# Patient Record
Sex: Female | Born: 1970 | Race: White | Hispanic: No | Marital: Married | Smoking: Never smoker
Health system: Southern US, Community
[De-identification: ages and names within clinical notes are randomized; demographics above are authoritative.]

## PROBLEM LIST (undated history)

## (undated) DIAGNOSIS — F419 Anxiety disorder, unspecified: Secondary | ICD-10-CM

## (undated) DIAGNOSIS — K56609 Unspecified intestinal obstruction, unspecified as to partial versus complete obstruction: Secondary | ICD-10-CM

## (undated) DIAGNOSIS — F32A Depression, unspecified: Secondary | ICD-10-CM

## (undated) DIAGNOSIS — I10 Essential (primary) hypertension: Secondary | ICD-10-CM

## (undated) HISTORY — DX: Depression, unspecified: F32.A

## (undated) HISTORY — PX: ABDOMINAL HYSTERECTOMY: SHX81

## (undated) HISTORY — PX: CHOLECYSTECTOMY: SHX55

## (undated) HISTORY — PX: GASTRIC BYPASS: SHX52

## (undated) HISTORY — PX: CARDIAC CATHETERIZATION: SHX172

## (undated) HISTORY — DX: Anxiety disorder, unspecified: F41.9

---

## 2020-09-22 DIAGNOSIS — N1330 Unspecified hydronephrosis: Secondary | ICD-10-CM | POA: Diagnosis not present

## 2020-09-22 DIAGNOSIS — R109 Unspecified abdominal pain: Secondary | ICD-10-CM | POA: Diagnosis not present

## 2020-09-22 DIAGNOSIS — N2889 Other specified disorders of kidney and ureter: Secondary | ICD-10-CM | POA: Diagnosis not present

## 2020-09-22 DIAGNOSIS — K449 Diaphragmatic hernia without obstruction or gangrene: Secondary | ICD-10-CM | POA: Diagnosis not present

## 2020-11-10 ENCOUNTER — Emergency Department
Admission: EM | Admit: 2020-11-10 | Discharge: 2020-11-10 | Disposition: A | Discharge status: Home / Self Care | Payer: BC Managed Care – PPO | Attending: Emergency Medicine | Admitting: Emergency Medicine

## 2020-11-10 ENCOUNTER — Other Ambulatory Visit: Payer: Self-pay

## 2020-11-10 DIAGNOSIS — M791 Myalgia, unspecified site: Secondary | ICD-10-CM | POA: Diagnosis not present

## 2020-11-10 DIAGNOSIS — B349 Viral infection, unspecified: Secondary | ICD-10-CM | POA: Insufficient documentation

## 2020-11-10 DIAGNOSIS — Z20822 Contact with and (suspected) exposure to covid-19: Secondary | ICD-10-CM | POA: Insufficient documentation

## 2020-11-10 DIAGNOSIS — E860 Dehydration: Secondary | ICD-10-CM | POA: Diagnosis not present

## 2020-11-10 DIAGNOSIS — R599 Enlarged lymph nodes, unspecified: Secondary | ICD-10-CM | POA: Insufficient documentation

## 2020-11-10 LAB — URINALYSIS, COMPLETE (UACMP) WITH MICROSCOPIC
Bacteria, UA: NONE SEEN
Bilirubin Urine: NEGATIVE
Glucose, UA: NEGATIVE mg/dL
Hgb urine dipstick: NEGATIVE
Ketones, ur: 5 mg/dL — AB
Leukocytes,Ua: NEGATIVE
Nitrite: NEGATIVE
Protein, ur: NEGATIVE mg/dL
Specific Gravity, Urine: 1.025 (ref 1.005–1.030)
WBC, UA: NONE SEEN WBC/hpf (ref 0–5)
pH: 5 (ref 5.0–8.0)

## 2020-11-10 LAB — COMPREHENSIVE METABOLIC PANEL
ALT: 25 U/L (ref 0–44)
AST: 24 U/L (ref 15–41)
Albumin: 4.1 g/dL (ref 3.5–5.0)
Alkaline Phosphatase: 89 U/L (ref 38–126)
Anion gap: 5 (ref 5–15)
BUN: 18 mg/dL (ref 6–20)
CO2: 29 mmol/L (ref 22–32)
Calcium: 8.8 mg/dL — ABNORMAL LOW (ref 8.9–10.3)
Chloride: 106 mmol/L (ref 98–111)
Creatinine, Ser: 0.63 mg/dL (ref 0.44–1.00)
GFR, Estimated: >60 mL/min (ref >60)
Glucose, Bld: 85 mg/dL (ref 70–99)
Potassium: 3.6 mmol/L (ref 3.5–5.1)
Sodium: 140 mmol/L (ref 135–145)
Total Bilirubin: 0.9 mg/dL (ref 0.3–1.2)
Total Protein: 6.5 g/dL (ref 6.5–8.1)

## 2020-11-10 LAB — CBC WITH DIFFERENTIAL/PLATELET
Abs Immature Granulocytes: 0.01 10*3/uL (ref 0.00–0.07)
Basophils Absolute: 0 10*3/uL (ref 0.0–0.1)
Basophils Relative: 0 %
Eosinophils Absolute: 0.2 10*3/uL (ref 0.0–0.5)
Eosinophils Relative: 3 %
HCT: 39.4 % (ref 36.0–46.0)
Hemoglobin: 13.3 g/dL (ref 12.0–15.0)
Immature Granulocytes: 0 %
Lymphocytes Relative: 29 %
Lymphs Abs: 1.9 10*3/uL (ref 0.7–4.0)
MCH: 29.2 pg (ref 26.0–34.0)
MCHC: 33.8 g/dL (ref 30.0–36.0)
MCV: 86.4 fL (ref 80.0–100.0)
Monocytes Absolute: 0.3 10*3/uL (ref 0.1–1.0)
Monocytes Relative: 5 %
Neutro Abs: 4 10*3/uL (ref 1.7–7.7)
Neutrophils Relative %: 63 %
Platelets: 285 10*3/uL (ref 150–400)
RBC: 4.56 MIL/uL (ref 3.87–5.11)
RDW: 13.1 % (ref 11.5–15.5)
WBC: 6.4 10*3/uL (ref 4.0–10.5)
nRBC: 0 % (ref 0.0–0.2)

## 2020-11-10 LAB — RESP PANEL BY RT-PCR (FLU A&B, COVID) ARPGX2
Influenza A by PCR: NEGATIVE
Influenza B by PCR: NEGATIVE
SARS Coronavirus 2 by RT PCR: NEGATIVE

## 2020-11-10 LAB — TROPONIN I (HIGH SENSITIVITY): Troponin I (High Sensitivity): 4 ng/L (ref <18)

## 2020-11-10 MED ORDER — SODIUM CHLORIDE 0.9 % IV BOLUS
1000.0000 mL | Freq: Once | INTRAVENOUS | Status: AC
  Administered 2020-11-10: 1000 mL via INTRAVENOUS

## 2020-11-10 NOTE — Discharge Instructions (Signed)
Please make sure you are eating and drinking well.  Return to the ER for any fevers, weakness, fatigue, dizziness, urgent changes in her health.

## 2020-11-10 NOTE — ED Provider Notes (Signed)
Patient's EKG is reviewed inter by me at 1940 Heart rate 55 9 QRS 89 QTc 440 Sinus bradycardia, no evidence acute ischemia   Sharyn Creamer, MD 11/10/20 1945

## 2020-11-10 NOTE — ED Provider Notes (Signed)
[facility] EMERGENCY DEPARTMENT Provider Note   CSN: [id number] Arrival date & time: 11/10/20  1744     History Chief Complaint  Patient presents with   Generalized Body Aches    [name] is a 50 y.o. female presents to the emergency department for evaluation of feeling tired and dizzy with occasional body aches.  Symptoms been present for 6 days.  She has had a little bit of a sore throat without a cough with no chest pain or shortness of breath.  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  She denies any chest pain, shortness of breath, abdominal pain, nausea vomiting or diarrhea.  No rashes.  She has been exposed to COVID and has been working at home due to this exposure.  She has been fully vaccinated for COVID.  She denies any fevers and has not been taking Tylenol or ibuprofen.  Patient has maintained a good appetite.  HPI     No past medical history on file.  There are no problems to display for this patient.    OB History   No obstetric history on file.     No family history on file.     Home Medications Prior to Admission medications   Not on File    Allergies    Patient has no allergy information on record.  Review of Systems   Review of Systems  Constitutional:  Positive for fatigue. Negative for chills and fever.  HENT:  Positive for sinus pain and sore throat. Negative for trouble swallowing and voice change.   Respiratory:  Negative for shortness of breath.   Cardiovascular:  Negative for chest pain.  Gastrointestinal:  Negative for abdominal pain, diarrhea, nausea and vomiting.  Musculoskeletal:  Negative for arthralgias, back pain, myalgias and neck pain.  Skin:  Negative for rash and wound.  Neurological:  Positive for light-headedness. Negative for dizziness, tremors, syncope, weakness, numbness and headaches.    Physical Exam Updated Vital Signs BP (!) 143/79   Pulse 71   Temp 98.3 F (36.8 C) (Oral)   Resp 17   SpO2 98%   Physical Exam Constitutional:      Appearance: She is well-developed.  HENT:     Head: Normocephalic and atraumatic.     Right Ear: Tympanic membrane, ear canal and external ear normal. There is no impacted cerumen.     Left Ear: Tympanic membrane, ear canal and external ear normal. There is no impacted cerumen.     Nose: Nose normal. No congestion.     Mouth/Throat:     Pharynx: Oropharynx is clear. No oropharyngeal exudate or posterior oropharyngeal erythema.  Eyes:     Extraocular Movements: Extraocular movements intact.     Conjunctiva/sclera: Conjunctivae normal.     Pupils: Pupils are equal, round, and reactive to light.  Cardiovascular:     Rate and Rhythm: Normal rate and regular rhythm.     Pulses: Normal pulses.     Heart sounds: Normal heart sounds.  Pulmonary:     Effort: Pulmonary effort is normal. No respiratory distress.  Abdominal:     General: There is no distension.     Palpations: There is no mass.     Tenderness: There is no abdominal tenderness. There is no guarding.  Musculoskeletal:        General: Normal range of motion.  Cervical back: Normal range of motion. No rigidity.  Lymphadenopathy:     Cervical: Cervical adenopathy (posterior) present.  Skin:    General: Skin is warm.     Findings: No rash.  Neurological:     General: No focal deficit present.     Mental Status: She is alert and oriented to person, place, and time. Mental status is at baseline.     Cranial Nerves: No cranial nerve deficit.     Motor: No weakness.     Gait: Gait normal.  Psychiatric:        Mood and Affect: Mood normal.        Behavior: Behavior normal.        Thought Content: Thought content normal.    ED Results / Procedures / Treatments   Labs (all labs ordered are listed, but only abnormal results are displayed) Labs Reviewed  COMPREHENSIVE  METABOLIC PANEL - Abnormal; Notable for the following components:      Result Value   Calcium 8.8 (*)    All other components within normal limits  URINALYSIS, COMPLETE (UACMP) WITH MICROSCOPIC - Abnormal; Notable for the following components:   Color, Urine YELLOW (*)    APPearance HAZY (*)    Ketones, ur 5 (*)    All other components within normal limits  RESP PANEL BY RT-PCR (FLU A&B, COVID) [id number]  CBC WITH DIFFERENTIAL/PLATELET  TROPONIN I (HIGH SENSITIVITY)    EKG None  Radiology No results found.  Procedures Procedures   Medications Ordered in ED Medications  sodium chloride 0.9 % bolus 1,000 mL (1,000 mLs Intravenous New Bag/Given 11/10/20 1942)    ED Course  I have reviewed the triage vital signs and the nursing notes.  Pertinent labs & imaging results that were available during my care of the patient were reviewed by me and considered in my medical decision making (see chart for details).    MDM Rules/Calculators/A&P                          50 year old female with feeling tired, occasionally dizzy with body aches and sore throat.  Vital signs stable, afebrile.  Patient with several negative home COVID test but with positive exposure.  PCR COVID test and flu test negative today.  Labs normal.  Urinalysis concerning for mild dehydration.  Given history and palpable posterior cervical lymphadenopathy likely with a viral illness.  Patient given IV fluids.  Patient feeling well at time of discharge.  Patient understands signs symptoms return to ER for. Final Clinical Impression(s) / ED Diagnoses Final diagnoses:  Viral illness  Dehydration    Rx / DC Orders ED Discharge Orders     None        Ronnette Juniper 11/10/20 2139    Phineas Semen, MD 11/10/20 2216

## 2020-11-10 NOTE — ED Triage Notes (Addendum)
Pt comes with c/o generalized body aches. Pt states last Wednesday she started feeling bad and was exposed to someone at work that tested + for COVID.  Pt states fatigue, possible fever and chills.  Pt states 3 neg covid tests at home.

## 2020-11-10 NOTE — ED Notes (Signed)
Per triage note, pt states she has been having generalized body aches and also noticed L lymph node swelling.

## 2021-02-26 DIAGNOSIS — Z20822 Contact with and (suspected) exposure to covid-19: Secondary | ICD-10-CM | POA: Diagnosis not present

## 2021-03-02 ENCOUNTER — Other Ambulatory Visit: Payer: Self-pay

## 2021-03-02 ENCOUNTER — Emergency Department
Admission: EM | Admit: 2021-03-02 | Discharge: 2021-03-02 | Disposition: A | Discharge status: Home / Self Care | Payer: BC Managed Care – PPO | Attending: Emergency Medicine | Admitting: Emergency Medicine

## 2021-03-02 ENCOUNTER — Emergency Department: Payer: BC Managed Care – PPO

## 2021-03-02 DIAGNOSIS — H02846 Edema of left eye, unspecified eyelid: Secondary | ICD-10-CM

## 2021-03-02 DIAGNOSIS — H0289 Other specified disorders of eyelid: Secondary | ICD-10-CM | POA: Diagnosis not present

## 2021-03-02 DIAGNOSIS — U071 COVID-19: Secondary | ICD-10-CM | POA: Diagnosis not present

## 2021-03-02 DIAGNOSIS — R0602 Shortness of breath: Secondary | ICD-10-CM | POA: Diagnosis not present

## 2021-03-02 DIAGNOSIS — I10 Essential (primary) hypertension: Secondary | ICD-10-CM | POA: Diagnosis not present

## 2021-03-02 HISTORY — DX: Essential (primary) hypertension: I10

## 2021-03-02 HISTORY — DX: Unspecified intestinal obstruction, unspecified as to partial versus complete obstruction: K56.609

## 2021-03-02 LAB — CBC WITH DIFFERENTIAL/PLATELET
Abs Immature Granulocytes: 0 10*3/uL (ref 0.00–0.07)
Basophils Absolute: 0 10*3/uL (ref 0.0–0.1)
Basophils Relative: 0 %
Eosinophils Absolute: 0.1 10*3/uL (ref 0.0–0.5)
Eosinophils Relative: 4 %
HCT: 38.6 % (ref 36.0–46.0)
Hemoglobin: 12.7 g/dL (ref 12.0–15.0)
Immature Granulocytes: 0 %
Lymphocytes Relative: 30 %
Lymphs Abs: 1 10*3/uL (ref 0.7–4.0)
MCH: 28.5 pg (ref 26.0–34.0)
MCHC: 32.9 g/dL (ref 30.0–36.0)
MCV: 86.7 fL (ref 80.0–100.0)
Monocytes Absolute: 0.3 10*3/uL (ref 0.1–1.0)
Monocytes Relative: 8 %
Neutro Abs: 1.9 10*3/uL (ref 1.7–7.7)
Neutrophils Relative %: 58 %
Platelets: 206 10*3/uL (ref 150–400)
RBC: 4.45 MIL/uL (ref 3.87–5.11)
RDW: 12.7 % (ref 11.5–15.5)
WBC: 3.2 10*3/uL — ABNORMAL LOW (ref 4.0–10.5)
nRBC: 0 % (ref 0.0–0.2)

## 2021-03-02 LAB — COMPREHENSIVE METABOLIC PANEL
ALT: 17 U/L (ref 0–44)
AST: 18 U/L (ref 15–41)
Albumin: 3.5 g/dL (ref 3.5–5.0)
Alkaline Phosphatase: 75 U/L (ref 38–126)
Anion gap: 5 (ref 5–15)
BUN: 9 mg/dL (ref 6–20)
CO2: 28 mmol/L (ref 22–32)
Calcium: 8.4 mg/dL — ABNORMAL LOW (ref 8.9–10.3)
Chloride: 106 mmol/L (ref 98–111)
Creatinine, Ser: 0.52 mg/dL (ref 0.44–1.00)
GFR, Estimated: >60 mL/min (ref >60)
Glucose, Bld: 93 mg/dL (ref 70–99)
Potassium: 3.2 mmol/L — ABNORMAL LOW (ref 3.5–5.1)
Sodium: 139 mmol/L (ref 135–145)
Total Bilirubin: 0.8 mg/dL (ref 0.3–1.2)
Total Protein: 6.1 g/dL — ABNORMAL LOW (ref 6.5–8.1)

## 2021-03-02 MED ORDER — ALBUTEROL SULFATE HFA 108 (90 BASE) MCG/ACT IN AERS: 2.0000 | INHALATION_SPRAY | RESPIRATORY_TRACT | 0 refills | Status: DC | PRN

## 2021-03-02 MED ORDER — ALBUTEROL SULFATE HFA 108 (90 BASE) MCG/ACT IN AERS
2.0000 | INHALATION_SPRAY | RESPIRATORY_TRACT | Status: DC | PRN
  Filled 2021-03-02 (×2): qty 6.7

## 2021-03-02 MED ORDER — OLOPATADINE HCL 0.1 % OP SOLN: 1.0000 [drp] | Freq: Two times a day (BID) | OPHTHALMIC | 0 refills | Status: AC | PRN

## 2021-03-02 NOTE — ED Triage Notes (Signed)
Pt to ER via POV with complaints of shortness of breath. Reports testing positive for covid via a PCR test on Friday 9/30. Reports Sunday she started finding it difficult to catch her breath, was having generalized chest pain associated with deep breaths, and a headache. Started having diarrhea today.   Also reports that her left eye has been hurting since Saturday, visibly swollen and red in triage. No drainage or discharge.   Reports having abdominal skin removal surgery two weeks.

## 2021-03-02 NOTE — Discharge Instructions (Signed)
For the swollen eyelid, you may take Tylenol or ibuprofen over-the-counter for pain, and apply an ice pack to help decrease the swelling.  You should return to the ER immediately for new, worsening, or persistent swelling, redness to the eye itself, worsening redness to the eyelid or skin surrounding the eye, changes in your vision or vision loss, swelling to the face, difficulty breathing or swallowing, high fever, or any other new or worsening symptoms that concern you.  You should also return if you experience worsening shortness of breath, chest pain, persistent vomiting, or generalized weakness.    You may take the albuterol inhaler as needed for shortness of breath or chest tightness.

## 2021-03-02 NOTE — ED Provider Notes (Signed)
[facility] Emergency Department Provider Note ____________________________________________   Event Date/Time   First MD Initiated Contact with Patient 03/02/21 1117     (approximate)  I have reviewed the triage vital signs and the nursing notes.   HISTORY  Chief Complaint COVID+ and Shortness of Breath    HPI [name] is a 50 y.o. female with PMH as noted below who presents with worsening shortness of breath as well as left eyelid swelling and pain over the last several days after being diagnosed with COVID 3 days ago.  The patient reports a cough but denies fever.  She has no associated chest pain.  She denies any vomiting or diarrhea.  She is vaccinated and boosted.  The left eyelid swelling started over the last few days and is somewhat painful.  She has no swelling or pain to the rest of her face.  She denies any blurred vision.  Past Medical History:  Diagnosis Date   Bowel obstruction (HCC)    Hypertension     There are no problems to display for this patient.   Past Surgical History:  Procedure Laterality Date   CHOLECYSTECTOMY     GASTRIC BYPASS      Prior to Admission medications   Medication Sig Start Date End Date Taking? Authorizing Provider  albuterol (VENTOLIN HFA) 108 (90 Base) MCG/ACT inhaler Inhale 2 puffs into the lungs every 4 (four) hours as needed for wheezing or shortness of breath. 03/02/21  Yes Dionne Bucy, MD  olopatadine (PATANOL) 0.1 % ophthalmic solution Place 1 drop into the left eye 2 (two) times daily as needed for up to 5 days (eyelid swelling, itching, pain). 03/02/21 03/07/21 Yes Dionne Bucy, MD    Allergies Patient has no allergy information on record.  No family history on file.  Social History    Review of Systems  Constitutional: No fever/chills Eyes: No visual changes.  Left eyelid swelling. ENT: No sore throat. Cardiovascular: Denies chest pain. Respiratory: Positive for  shortness of breath. Gastrointestinal: No vomiting or diarrhea.  Genitourinary: Negative for dysuria.  Musculoskeletal: Negative for back pain. Skin: Negative for rash. Neurological: Negative for headache.   ____________________________________________   PHYSICAL EXAM:  VITAL SIGNS: ED Triage Vitals  Enc Vitals Group     BP 03/02/21 0957 (!) 149/91     Pulse Rate 03/02/21 0953 69     Resp 03/02/21 0953 20     Temp 03/02/21 0953 98.1 F (36.7 C)     Temp Source 03/02/21 0953 Oral     SpO2 03/02/21 0953 100 %     Weight 03/02/21 0954 150 lb (68 kg)     Height 03/02/21 0954 5' 5.5" (1.664 m)     Head Circumference --      Peak Flow --      Pain Score 03/02/21 0953 7     Pain Loc --      Pain Edu? --      Excl. in GC? --     Constitutional: Alert and oriented. Well appearing and in no acute distress. Eyes: Conjunctivae are normal, no injection.  EOMI.  PERRLA.  Left eye appears normal.  Left eyelid mildly swollen with no significant erythema, abnormal warmth, or induration.  No purulence or discharge. Head: Atraumatic. Nose: No congestion/rhinnorhea. Mouth/Throat: Mucous membranes are moist.   Neck: Normal range of motion.  Cardiovascular: Normal rate, regular rhythm. Grossly normal heart sounds.  Good peripheral circulation. Respiratory: Normal respiratory effort.  No retractions.  Lungs CTAB. Gastrointestinal: No distention.  Musculoskeletal: Extremities warm and well perfused.  Neurologic:  Normal speech and language. No gross focal neurologic deficits are appreciated.  Skin:  Skin is warm and dry. No rash noted. Psychiatric: Mood and affect are normal. Speech and behavior are normal.  ____________________________________________   LABS (all labs ordered are listed, but only abnormal results are displayed)  Labs Reviewed  COMPREHENSIVE METABOLIC PANEL - Abnormal; Notable for the following components:      Result Value   Potassium 3.2 (*)    Calcium 8.4 (*)     Total Protein 6.1 (*)    All other components within normal limits  CBC WITH DIFFERENTIAL/PLATELET - Abnormal; Notable for the following components:   WBC 3.2 (*)    All other components within normal limits   ____________________________________________  EKG  ED ECG REPORT I, Dionne Bucy, the attending physician, personally viewed and interpreted this ECG.  Date: 03/02/2021 EKG Time: 1001 Rate: 62 Rhythm: normal sinus rhythm QRS Axis: normal Intervals: normal ST/T Wave abnormalities: normal Narrative Interpretation: no evidence of acute ischemia  ____________________________________________  RADIOLOGY  Chest x-ray interpreted by me shows no focal consolidation or edema  ____________________________________________   PROCEDURES  Procedure(s) performed: No  Procedures  Critical Care performed: No ____________________________________________   INITIAL IMPRESSION / ASSESSMENT AND PLAN / ED COURSE  Pertinent labs & imaging results that were available during my care of the patient were reviewed by me and considered in my medical decision making (see chart for details).   50 year old female with past history of hypertension presents with some shortness of breath and left eyelid swelling after being diagnosed with COVID-19 several days ago.  The patient is vaccinated and boosted.  On exam she is well-appearing.  Her vital signs are normal except for hypertension.  She does not demonstrate any respiratory distress or increased work of breathing and her lungs are clear to auscultation.  She has mild swelling to the left eyelid with no erythema or induration and no abnormalities to the eye itself.  The shortness of breath is consistent with her COVID-19.  Chest x-ray was obtained and shows no consolidations or abnormal interstitial markings.  O2 saturation is 100%.  Labs obtained from triage are unremarkable.  I will give her a prescription for an albuterol inhaler as  needed for bronchospasm, however she does not require supplemental oxygen or other acute intervention.  She is out of the window for treatment with Paxlovid.    The eyelid swelling is relatively mild and appears allergic in nature rather than infectious.  There is no evidence of preseptal cellulitis.  There is no conjunctivitis and the eye itself is not involved.  The patient denies visual disturbance.  I will prescribe Patanol drops and recommended that the patient ice the area and take ibuprofen if needed for pain.  I counseled the patient on the results of the work-up.  Return precautions given, and she expresses understanding.   ____________________________________________   FINAL CLINICAL IMPRESSION(S) / ED DIAGNOSES  Final diagnoses:  COVID-19  Swelling of left eyelid      NEW MEDICATIONS STARTED DURING THIS VISIT:  Discharge Medication List as of 03/02/2021 11:55 AM     START taking these medications   Details  albuterol (VENTOLIN HFA) 108 (90 Base) MCG/ACT inhaler Inhale 2 puffs into the lungs every 4 (four) hours as needed for wheezing or shortness of breath., Starting Tue 03/02/2021, Normal         Note:  This document was prepared using Dragon voice recognition software and may include unintentional dictation errors.    Dionne Bucy, MD 03/02/21 1319

## 2021-03-04 DIAGNOSIS — H0014 Chalazion left upper eyelid: Secondary | ICD-10-CM | POA: Diagnosis not present

## 2021-08-24 ENCOUNTER — Other Ambulatory Visit: Payer: Self-pay

## 2021-08-24 DIAGNOSIS — L03213 Periorbital cellulitis: Secondary | ICD-10-CM | POA: Insufficient documentation

## 2021-08-24 DIAGNOSIS — I10 Essential (primary) hypertension: Secondary | ICD-10-CM | POA: Insufficient documentation

## 2021-08-24 DIAGNOSIS — R220 Localized swelling, mass and lump, head: Secondary | ICD-10-CM | POA: Diagnosis not present

## 2021-08-24 NOTE — ED Triage Notes (Signed)
Pt presents tonight with left eye swelling starting yesterday. She notes trying OTC tylenol and warm compresses with minimal relief. Mild erythema to her undereye, no edema noted. She denies vision changes. Denies CP or SOB.  ?

## 2021-08-24 NOTE — ED Notes (Signed)
Visual Acuity Results - Pt wears glasses when reading and did not bring them with her tonight. ? ? ?Bilateral eyes 20/25 ?Right eye 20/25 ?Left eye 20/50 ?

## 2021-08-25 ENCOUNTER — Emergency Department
Admission: EM | Admit: 2021-08-25 | Discharge: 2021-08-25 | Disposition: A | Discharge status: Home / Self Care | Payer: BC Managed Care – PPO | Attending: Emergency Medicine | Admitting: Emergency Medicine

## 2021-08-25 DIAGNOSIS — L03213 Periorbital cellulitis: Secondary | ICD-10-CM

## 2021-08-25 DIAGNOSIS — H0015 Chalazion left lower eyelid: Secondary | ICD-10-CM | POA: Diagnosis not present

## 2021-08-25 MED ORDER — AMOXICILLIN-POT CLAVULANATE 875-125 MG PO TABS: 1.0000 | ORAL_TABLET | Freq: Two times a day (BID) | ORAL | 0 refills | Status: AC

## 2021-08-25 MED ORDER — DIPHENHYDRAMINE HCL 25 MG PO CAPS
50.0000 mg | ORAL_CAPSULE | Freq: Once | ORAL | Status: AC
  Administered 2021-08-25: 50 mg via ORAL
  Filled 2021-08-25: qty 2

## 2021-08-25 MED ORDER — AMOXICILLIN-POT CLAVULANATE 875-125 MG PO TABS
1.0000 | ORAL_TABLET | Freq: Once | ORAL | Status: AC
  Administered 2021-08-25: 1 via ORAL
  Filled 2021-08-25: qty 1

## 2021-08-25 MED ORDER — CEPHALEXIN 500 MG PO CAPS: 500.0000 mg | ORAL_CAPSULE | Freq: Once | ORAL | Status: DC

## 2021-08-25 NOTE — Discharge Instructions (Signed)
You may alternate Tylenol 1000 mg every 6 hours as needed for pain, fever and Ibuprofen 800 mg every 6-8 hours as needed for pain, fever.  Please take Ibuprofen with food.  Do not take more than 4000 mg of Tylenol (acetaminophen) in a 24 hour period. ° °

## 2021-08-25 NOTE — ED Provider Notes (Signed)
? ?[facility] ?Provider Note ? ? ? Event Date/Time  ? First MD Initiated Contact with Patient 08/25/21 0009   ?  (approximate) ? ? ?History  ? ?Facial Swelling ? ? ?HPI ? ?Faylynn Stamos is a 51 y.o. female with history of hypertension who presents to the emergency department with swelling, redness and itching underneath the left eye that started today.  No fever.  No injury to the eye.  She denies any eye pain, eye irritation, vision changes, drainage.  She has been seen by [facility] in the past for a stye.  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  She does not feel like there has been any changes in the vision from her left eye. ? ?History provided by patient and husband. ? ? ? ?Past Medical History:  ?Diagnosis Date  ? Bowel obstruction (HCC)   ? Hypertension   ? ? ?Past Surgical History:  ?Procedure Laterality Date  ? CHOLECYSTECTOMY    ? GASTRIC BYPASS    ? ? ?MEDICATIONS:  ?Prior to Admission medications   ?Medication Sig Start Date End Date Taking? Authorizing Provider  ?albuterol (VENTOLIN HFA) 108 (90 Base) MCG/ACT inhaler Inhale 2 puffs into the lungs every 4 (four) hours as needed for wheezing or shortness of breath. 03/02/21   Dionne Bucy, MD  ? ? ?Physical Exam  ? ?Triage Vital Signs: ?ED Triage Vitals [08/24/21 2253]  ?Enc Vitals Group  ?   BP (!) 163/96  ?   Pulse Rate 71  ?   Resp 18  ?   Temp 97.7 ?F (36.5 ?C)  ?   Temp Source Oral  ?   SpO2 98 %  ?   Weight 157 lb (71.2 kg)  ?   Height 5' 5.5" (1.664 m)  ?   Head Circumference   ?   Peak Flow   ?   Pain Score 5  ?   Pain Loc   ?   Pain Edu?   ?   Excl. in GC?   ? ? ?Most recent vital signs: ?Vitals:  ? 08/24/21 2253 08/25/21 0052  ?BP: (!) 163/96 (!) 141/102  ?Pulse: 71 62  ?Resp: 18 20  ?Temp: 97.7 ?F (36.5 ?C)   ?SpO2: 98% 100%  ? ? ?CONSTITUTIONAL: Alert and oriented and responds appropriately to  questions. Well-appearing; well-nourished ?HEAD: Normocephalic, atraumatic ?EYES: Conjunctivae clear, pupils appear equal, sclera nonicteric, pupils equal and reactive to light bilaterally, funduscopic exam limited due to patient's eyes not being dilated, no foreign bodies appreciated, no hyphema or hypopyon, no chemosis, no scleral injection, no drainage, extraocular movements intact and no pain with eye movements.  Minimal amount of redness and swelling noted underneath the left eyelid with some tenderness but no fluctuance, induration.  Small amount of swelling noted to the inner left lower eyelid. ?ENT: normal nose; moist mucous membranes ?NECK: Supple, normal ROM ?CARD: RRR; S1 and S2 appreciated; no murmurs, no clicks, no rubs, no gallops ?RESP: Normal chest excursion without splinting or tachypnea; breath sounds clear and equal bilaterally; no wheezes, no rhonchi, no rales, no hypoxia or respiratory distress, speaking full sentences ?ABD/GI: Normal bowel sounds; non-distended; soft, non-tender, no rebound, no guarding, no peritoneal signs ?BACK: The back appears normal ?EXT: Normal ROM in all joints; no deformity noted, no edema; no cyanosis ?SKIN: Normal color for age and race; warm;  no rash on exposed skin ?NEURO: Moves all extremities equally, normal speech ?PSYCH: The patient's mood and manner are appropriate. ? ? ?ED Results / Procedures / Treatments  ? ?LABS: ?(all labs ordered are listed, but only abnormal results are displayed) ?Labs Reviewed - No data to display ? ? ?EKG: ? ? ?RADIOLOGY: ?My personal review and interpretation of imaging:   ? ?I have personally reviewed all radiology reports.   ?No results found. ? ? ?PROCEDURES: ? ?Critical Care performed: No ? ? ?CRITICAL CARE ?Performed by: Baxter HireKristen Zelma Mazariego ? ? ?Total critical care time: 0 minutes ? ?Critical care time was exclusive of separately billable procedures and treating other patients. ? ?Critical care was necessary to treat or prevent  imminent or life-threatening deterioration. ? ?Critical care was time spent personally by me on the following activities: development of treatment plan with patient and/or surrogate as well as nursing, discussions with consultants, evaluation of patient's response to treatment, examination of patient, obtaining history from patient or surrogate, ordering and performing treatments and interventions, ordering and review of laboratory studies, ordering and review of radiographic studies, pulse oximetry and re-evaluation of patient's condition. ? ? ?Procedures ? ? ? ?IMPRESSION / MDM / ASSESSMENT AND PLAN / ED COURSE  ?I reviewed the triage vital signs and the nursing notes. ? ? ? ?Patient here with redness, itching, swelling and tenderness underneath the right eyelid. ? ?The patient is on the cardiac monitor to evaluate for evidence of arrhythmia and/or significant heart rate changes. ? ? ?DIFFERENTIAL DIAGNOSIS (includes but not limited to):   Hordeolum, chalazion, preseptal cellulitis.  Doubt postseptal cellulitis, abscess, corneal abrasion or ulceration, uveitis, glaucoma ? ? ?PLAN: Patient here with likely very early preseptal cellulitis.  Clinically low suspicion for postseptal cellulitis.  No symptoms involving the eye itself.  She denies any acute vision changes.  She is afebrile here.  Will discharge with Augmentin and recommend warm compresses, alternating Tylenol and Motrin, Benadryl as needed.  We will give her outpatient follow-up. ? ?At this time, I do not feel there is any life-threatening condition present. I reviewed all nursing notes, vitals, pertinent previous records.  All lab and urine results, EKGs, imaging ordered have been independently reviewed and interpreted by myself.  I reviewed all available radiology reports from any imaging ordered this visit.  Based on my assessment, I feel the patient is safe to be discharged home without further emergent workup and can continue workup as an outpatient  as needed. Discussed all findings, treatment plan as well as usual and customary return precautions with patient and husband.  They verbalize understanding and are comfortable with this plan.  Outpatient follow-up has been provided as needed.  All questions have been answered. ? ? ? ?MEDICATIONS GIVEN IN ED: ?Medications  ?amoxicillin-clavulanate (AUGMENTIN) 875-125 MG per tablet 1 tablet (1 tablet Oral Given 08/25/21 0052)  ?diphenhydrAMINE (BENADRYL) capsule 50 mg (50 mg Oral Given 08/25/21 0101)  ? ? ? ? ? ? ?CONSULTS: No emergent consultation needed at this time. ? ? ?OUTSIDE RECORDS REVIEWED: Reviewed patient's last office visit with Arrie SenateLeah Fordham on 02/26/2021. ? ? ? ? ? ? ? ? ?FINAL CLINICAL IMPRESSION(S) / ED DIAGNOSES  ? ?Final diagnoses:  ?Preseptal cellulitis of left eye  ? ? ? ?Rx / DC Orders  ? ?ED Discharge Orders   ? ?      Ordered  ?  amoxicillin-clavulanate (AUGMENTIN) 875-125 MG tablet  2 times daily       ? 08/25/21 0031  ? ?  ?  ? ?  ? ? ? ?  Note:  This document was prepared using Dragon voice recognition software and may include unintentional dictation errors. ?  ?Willmar Stockinger, Layla Maw, DO ?08/25/21 0117 ? ?

## 2021-08-25 NOTE — ED Notes (Signed)
Pt to ED for L eye pain and swelling that started this morning. Pt states that her eye has become more tender and it feels "scratchy".  Pt has L eye edema present to under eye on assessment. Denies new blurry vision (has 20/60 vision in L eye).   ?

## 2021-09-30 ENCOUNTER — Other Ambulatory Visit (HOSPITAL_COMMUNITY): Payer: BC Managed Care – PPO | Attending: Psychiatry | Admitting: Professional

## 2021-09-30 DIAGNOSIS — F331 Major depressive disorder, recurrent, moderate: Secondary | ICD-10-CM | POA: Insufficient documentation

## 2021-09-30 DIAGNOSIS — Z634 Disappearance and death of family member: Secondary | ICD-10-CM | POA: Insufficient documentation

## 2021-09-30 DIAGNOSIS — F419 Anxiety disorder, unspecified: Secondary | ICD-10-CM | POA: Insufficient documentation

## 2021-09-30 DIAGNOSIS — F332 Major depressive disorder, recurrent severe without psychotic features: Secondary | ICD-10-CM | POA: Insufficient documentation

## 2021-09-30 DIAGNOSIS — Z9151 Personal history of suicidal behavior: Secondary | ICD-10-CM | POA: Insufficient documentation

## 2021-10-05 ENCOUNTER — Telehealth (HOSPITAL_COMMUNITY): Payer: Self-pay | Admitting: Professional

## 2021-10-06 ENCOUNTER — Other Ambulatory Visit (HOSPITAL_COMMUNITY): Payer: BC Managed Care – PPO | Admitting: Licensed Clinical Social Worker

## 2021-10-06 ENCOUNTER — Encounter (HOSPITAL_COMMUNITY): Payer: Self-pay

## 2021-10-06 ENCOUNTER — Other Ambulatory Visit (HOSPITAL_COMMUNITY): Payer: BC Managed Care – PPO

## 2021-10-06 DIAGNOSIS — F419 Anxiety disorder, unspecified: Secondary | ICD-10-CM | POA: Diagnosis not present

## 2021-10-06 DIAGNOSIS — R4589 Other symptoms and signs involving emotional state: Secondary | ICD-10-CM

## 2021-10-06 DIAGNOSIS — F332 Major depressive disorder, recurrent severe without psychotic features: Secondary | ICD-10-CM

## 2021-10-06 DIAGNOSIS — Z634 Disappearance and death of family member: Secondary | ICD-10-CM | POA: Diagnosis not present

## 2021-10-06 DIAGNOSIS — Z9151 Personal history of suicidal behavior: Secondary | ICD-10-CM | POA: Diagnosis not present

## 2021-10-06 NOTE — Plan of Care (Signed)
?  Problem: Depression CCP Problem  1  ?Goal: STG: [name] WILL ATTEND AT LEAST 80% OF SCHEDULED PHP SESSIONS ?Outcome: Not Applicable ?Goal: STG: [name] WILL COMPLETE AT LEAST 80% OF ASSIGNED HOMEWORK ?Outcome: Not Applicable ?Goal: STG: Reduce overall depression score by a minimum of 25% on the Patient Health Questionnaire (PHQ-9) or the Montgomery-Asberg Depression Rating Scale (MADRS) ?Outcome: Not Applicable ?Goal: STG: [name] WILL IDENTIFY AT LEAST 3 COGNITIVE PATTERNS AND BELIEFS THAT SUPPORT DEPRESSION ?Outcome: Not Applicable ? Pt verbally agrees to treatment plan. ?

## 2021-10-06 NOTE — Therapy (Signed)
[place] ?[facility] ?[address] ?[place], [place], [address] ?Phone: [phone]   Fax:  [phone] ? ?Occupational Therapy Evaluation ?Virtual Visit via Video Note ? ?I connected with [name] on 10/06/21 at  8:00 AM EDT by a video enabled telemedicine application and verified that I am speaking with the correct person using two identifiers. ? ?Location: ?Patient: home ?Provider: office ?  ?I discussed the limitations of evaluation and management by telemedicine and the availability of in person appointments. The patient expressed understanding and agreed to proceed. ? ?  ?The patient was advised to call back or seek an in-person evaluation if the symptoms worsen or if the condition fails to improve as anticipated. ? ?I provided 100 minutes of non-face-to-face time during this encounter. ? ? ?Patient Details  ?Name: [name] ?MRN: [id number] ?Date of Birth: [birth year] ?No data recorded ? ?Encounter Date: 10/06/2021 ? ? OT End of Session - 10/06/21 2029   ? ? Visit Number 1   ? Number of Visits 20   ? Date for OT Re-Evaluation 11/05/21   ? Authorization Type no coverage on file   ? OT Start Time 0915   ? OT Stop Time 1255   eval: 0915-1000; Tx: 4166-0630  ? OT Time Calculation (min) 220 min   ? Equipment Utilized During Treatment power point / virtual Webex   ? Activity Tolerance Patient tolerated treatment well   ? Behavior During Therapy [facility] for tasks assessed/performed   ? ?  ?  ? ?  ? ? ?Past Medical History:  ?Diagnosis Date  ? Bowel obstruction (HCC)   ? Hypertension   ? ? ?Past Surgical History:  ?Procedure Laterality Date  ? CHOLECYSTECTOMY    ? GASTRIC BYPASS    ? ? ?There were no vitals filed for this visit. ? ? Subjective Assessment - 10/06/21 2027   ? ? Currently in Pain? No/denies   ? Pain Score 0-No pain   ? Multiple Pain Sites No   ? ?  ?  ? ?  ? ? ? ? ? ? ? ? ? ? ? ? ? ? ? ? ? ? ? ? ? ? OT Education - 10/06/21 2028   ? ? Education  Details OT Role / goal setting and the concept of reciprocity 2/2   ? Person(s) Educated Patient   ? Methods Explanation;Demonstration;Handout   ? Comprehension Verbalized understanding   ? ?  ?  ? ?  ? ? ? OT Short Term Goals - 10/06/21 2036   ? ?  ? OT SHORT TERM GOAL #1  ? Title pt will be educated and independent w/ interventions to improve psychosicial social skills and ADL/iADLs for community re-entry   ? Time 4   ? Period Weeks   ? Status New   ? Target Date 11/05/21   ?  ? OT SHORT TERM GOAL #2  ? Title Pt will demonstrate independence w/ grief mgmt in order to engage in successful community reentry at time of discharge   ? Time 4   ? Period Weeks   ? Status New   ? Target Date 11/05/21   ?  ? OT SHORT TERM GOAL #3  ? Title Pt will demonstrate independence w/ coping skills in order to engage in succesful community re-entry at time of discharge   ? Time 4   ? Period Weeks   ? Status New   ? Target Date 11/05/21   ? ?  ?  ? ?  ? ? ?  OT Assessment ? ?Diagnosis: MDD ?Past medical history/referral information: MDD ?[facility] ?ADLs: WNL w/o assist ?Work: Media plannerTE ?Leisure: minimal engagement  ?Social support: minimal and limited  ?Struggles: coping / sleep / grief  ?OT goal: improve sleep / goal setting / coping skills / time mgmt  ? ?OCAIRS Mental Health Interview Summary of Client Scores: ? Facilitates participation in occupation Allows participation in occupation Inhibits participation in occupation Restricts participation in occupation Comments:  ?Roles   X    ?Habits    X   ?Personal Causation   X    ?Values   X    ?Interests   X    ?Skills    X   ?Short-Term Goals    X   ?Long-term Goals  X     ?Interpretation of Past Experiences  X     ?Physical Environment X      ?Social Environment   X    ?Readiness for Change  X     ? ? Need for Occupational Therapy: ?    ?    ? 2 Need for OT intervention indicated to restore/improve participation  ?    ?Assessment:  Patient demonstrates behavior that  INHIBITS participation in occupation.  Patient will benefit from occupational therapy intervention in order to improve time management, financial management, stress management, job readiness skills, social skills, and health management skills in preparation to return to full time community living and to be a productive community member.   ? ?Plan:  Patient will participate in skilled occupational therapy sessions individually or in a group setting to improve coping skills, psychosocial skills, and emotional skills required to return to prior level of function. ?Treatment will be 4-5 times per week for 4 weeks.   ? ? ? ?Group Session: ? ?S: "I'm just going to sit back and take this in today" "Not really ready to share so much today if that is okay" ? ?O:  The therapy session was led by an occupational therapist via telehealth with five patients in attendance. The focus of the session was on exploring the power of reciprocity and fairness to improve relationships and overall mental health and wellbeing. At the beginning of the session, the OT provided an introduction to the topic and defined the session's objectives. The participants then engaged in a discussion about their past experiences with the concept of reciprocity and how these experiences have influenced their lives and relationships, both positively and negatively. Throughout the session, the OT emphasized the importance of reciprocal relationships and how this concept can serve as a guide for future engagements with relationships and relationship development. ? ?A: The patient attended the therapy session on the power of reciprocity and fairness to improve relationships and overall mental health and wellbeing, but she did not actively participate in the discussion, she was attentive and shared intermittently and stated as she is new to the group she'd rather just "take it in" today. Despite pt's minimal participation, patient fully demonstrated an  understanding of the importance of reciprocal relationships and how it can impact one's mental health and wellbeing.  ? ?P: Continue to attend PHP OT group sessions 5x week for 4 weeks to promote daily structure, social engagement, and opportunities to develop and utilize adaptive strategies to maximize functional performance in preparation for safe transition and integration back into school, work, and the community. Plan to address topic of tbd in next OT group session. ? ? ? ? ? ?  Plan - 10/06/21 2032   ? ? OT Occupational Profile and History Problem Focused Assessment - Including review of records relating to presenting problem   ? Occupational performance deficits (Please refer to evaluation for details): IADL's;Rest and Sleep;Work;Leisure;Social Participation   ? Psychosocial Skills Coping Strategies;Habits;Interpersonal Interaction;Routines and Behaviors   ? Rehab Potential Good   ? Clinical Decision Making Limited treatment options, no task modification necessary   ? Comorbidities Affecting Occupational Performance: None   ? Modification or Assistance to Complete Evaluation  No modification of tasks or assist necessary to complete eval   ? OT Frequency 5x / week   ? OT Duration 4 weeks   ? OT Treatment/Interventions Psychosocial skills training;Coping strategies training;Patient/family education   ? Consulted and Agree with Plan of Care Patient   ? ?  ?  ? ?  ? ? ?Patient will benefit from skilled therapeutic intervention in order to improve the following deficits and impairments:   ?  ?  ?Psychosocial Skills: Coping Strategies, Habits, Interpersonal Interaction, Routines and Behaviors ? ? ?Visit Diagnosis: ?Difficulty coping ? ?Severe episode of recurrent major depressive disorder, without psychotic features (HCC) ? ? ? ?Problem List ?Patient Active Problem List  ? Diagnosis Date Noted  ? Major depressive disorder, recurrent episode, severe (HCC) 09/30/2021  ? ? ?Ted Mcalpine, OT ?10/06/2021, 8:44  PM ? ?Kerrin Champagne, OT ? ? ?[place] ?[facility] ?[address] ?[place], [place], [address] ?Phone: [phone]   Fax:  [phone] ? ?Name: [name] ?MRN: [id number]

## 2021-10-07 ENCOUNTER — Other Ambulatory Visit (HOSPITAL_COMMUNITY): Payer: BC Managed Care – PPO

## 2021-10-07 ENCOUNTER — Other Ambulatory Visit (HOSPITAL_COMMUNITY): Payer: BC Managed Care – PPO | Admitting: Licensed Clinical Social Worker

## 2021-10-07 ENCOUNTER — Encounter (HOSPITAL_COMMUNITY): Payer: Self-pay

## 2021-10-07 DIAGNOSIS — F332 Major depressive disorder, recurrent severe without psychotic features: Secondary | ICD-10-CM

## 2021-10-07 DIAGNOSIS — Z634 Disappearance and death of family member: Secondary | ICD-10-CM | POA: Diagnosis not present

## 2021-10-07 DIAGNOSIS — R4589 Other symptoms and signs involving emotional state: Secondary | ICD-10-CM

## 2021-10-07 DIAGNOSIS — F419 Anxiety disorder, unspecified: Secondary | ICD-10-CM | POA: Diagnosis not present

## 2021-10-07 DIAGNOSIS — Z9151 Personal history of suicidal behavior: Secondary | ICD-10-CM | POA: Diagnosis not present

## 2021-10-07 NOTE — Therapy (Signed)
[place] ?[facility] ?[place], [place], [address] ?Phone: [phone]   Fax:  [phone] ? ?Occupational Therapy Treatment ?Virtual Visit via Video Note ? ?I connected with [name] on 10/07/21 at  8:00 AM EDT by a video enabled telemedicine application and verified that I am speaking with the correct person using two identifiers. ? ?Location: ?Patient: home ?Provider: office ?  ?I discussed the limitations of evaluation and management by telemedicine and the availability of in person appointments. The patient expressed understanding and agreed to proceed. ? ?  ?The patient was advised to call back or seek an in-person evaluation if the symptoms worsen or if the condition fails to improve as anticipated. ? ?I provided 55 minutes of non-face-to-face time during this encounter. ? ? ?Patient Details  ?Name: [name] ?MRN: [id number] ?Date of Birth: 05-25-1971 ?No data recorded ? ?Encounter Date: 10/07/2021 ? ? OT End of Session - 10/07/21 1909   ? ? Visit Number 2   ? Number of Visits 20   ? Date for OT Re-Evaluation 11/05/21   ? Authorization Type [employer] [place] PPO ADV BLUE 05/30/2021- current   Deduct 200- 0 met 25.00 copay FOR SHORT TERM REHAB   OOP 2400-180.00 met   No Co Ins   Visit limit 60- 0 met based on medical necessity PT/OT Combined   No Hulan [place] Ref# [id number]   ? Authorization - Number of Visits 60   ? OT Start Time 1200   ? OT Stop Time 5397   ? OT Time Calculation (min) 55 min   ? Equipment Utilized During Treatment power point / virtual Webex   ? Activity Tolerance Patient tolerated treatment well   ? Behavior During Therapy [facility] for tasks assessed/performed   ? ?  ?  ? ?  ? ? ?Past Medical History:  ?Diagnosis Date  ? Bowel obstruction ([place])   ? Hypertension   ? ? ?Past Surgical History:  ?Procedure Laterality Date  ? CHOLECYSTECTOMY    ? GASTRIC BYPASS    ? ? ?There were no vitals filed for this visit. ? ?  Subjective Assessment - 10/07/21 1905   ? ? Currently in Pain? No/denies   ? Pain Score 0-No pain   ? ?  ?  ? ?  ? ? ? ?Group Session: ? ?S: "I have examples of using boundaries and can speak to how setting clear and personal boundaries, relationships seem to be better, easier overall."  ? ?O: Today's OT group session focuses on the importance of setting and maintaining healthy boundaries for individuals who suffer from depression, anxiety, grief, and loss. OT highlighted and explored the definition of boundaries and provides examples of healthy boundaries, including knowing one's limits, saying no when necessary, and communicating needs clearly. OT emphasized the benefits of healthy boundary-setting, including improved self-esteem, increased control, and enhanced overall functioning. Common difficulties in setting and adhering to healthy boundaries are also discussed, such as fear of rejection, guilt, and conflict. The group also explores the impact of boundaries on one's ADLs from an, including time management, sleep hygiene, self-care activities, and social participation. The session concludes with an assignment for the patients to practice boundary-setting skills, seek support from others, and prioritize their well-being to enhance their recovery and overall functioning. ? ?A: The patient demonstrated active participation and interest in understanding the importance of boundaries for their mental health. She actively contributed to discussions, asked questions, and provided personal and  specific examples related to boundary-setting. The patient showed a willingness to explore and practice boundary-setting skills and expresses a desire to improve their overall well-being. She demonstrated an understanding of the impact of boundaries on their activities of daily living and expressed motivation to incorporate healthy boundaries into their daily life. The patient's active engagement and receptiveness indicate a  potential for successful implementation of boundary-setting strategies. OT will conclude this topic in part 2 in tomorrow's OT group session. ? ?P: Continue to attend PHP OT group sessions 5x week for 4 weeks to promote daily structure, social engagement, and opportunities to develop and utilize adaptive strategies to maximize functional performance in preparation for safe transition and integration back into school, work, and the community. Plan to address topic of Boundaries for Improved Activities of Daily Living 2/2 in next OT group session. ? ? ? ? ? ? ? ? ? ? ? ? ? ? ? ? ? ? ? ? OT Education - 10/07/21 1906   ? ? Education Details Boundaries for Improved Activities of Daily Living part 1/2   ? Person(s) Educated Patient   ? Methods Explanation;Demonstration;Handout   ? Comprehension Verbalized understanding   ? ?  ?  ? ?  ? ? ? OT Short Term Goals - 10/06/21 2036   ? ?  ? OT SHORT TERM GOAL #1  ? Title pt will be educated and independent w/ interventions to improve psychosicial social skills and ADL/iADLs for community re-entry   ? Time 4   ? Period Weeks   ? Status New   ? Target Date 11/05/21   ?  ? OT SHORT TERM GOAL #2  ? Title Pt will demonstrate independence w/ grief mgmt in order to engage in successful community reentry at time of discharge   ? Time 4   ? Period Weeks   ? Status New   ? Target Date 11/05/21   ?  ? OT SHORT TERM GOAL #3  ? Title Pt will demonstrate independence w/ coping skills in order to engage in succesful community re-entry at time of discharge   ? Time 4   ? Period Weeks   ? Status New   ? Target Date 11/05/21   ? ?  ?  ? ?  ? ? ? ? ? ? ? ? ? ? ? Plan - 10/07/21 1910   ? ? Psychosocial Skills Coping Strategies;Habits;Interpersonal Interaction;Routines and Behaviors   ? ?  ?  ? ?  ? ? ?Patient will benefit from skilled therapeutic intervention in order to improve the following deficits and impairments:   ?  ?  ?Psychosocial Skills: Coping Strategies, Habits, Interpersonal  Interaction, Routines and Behaviors ? ? ?Visit Diagnosis: ?Difficulty coping ? ? ? ?Problem List ?Patient Active Problem List  ? Diagnosis Date Noted  ? Major depressive disorder, recurrent episode, severe ([place]) 09/30/2021  ? ? ?Brantley Stage, OT ?10/07/2021, 7:11 PM ? ?Cornell Barman, OT ? ? ?[place] ?[facility] ?[place], [place], [address] ?Phone: [phone]   Fax:  [phone] ? ?Name: [name] ?MRN: [id number] ?Date of Birth: July 20, 1970 ? ?

## 2021-10-07 NOTE — Psych (Signed)
Virtual Visit via Video Note ? ?I connected with Anna ChattersKathleen Barr on 09/30/21 at 10:00 AM EDT by a video enabled telemedicine application and verified that I am speaking with the correct person using two identifiers. ? ?Location: ?Patient: Work- Pharmacist, communityprivate mtg room ?Provider: Clinical Home Office ?  ?I discussed the limitations of evaluation and management by telemedicine and the availability of in person appointments. The patient expressed understanding and agreed to proceed. ? ?Follow Up Instructions: ? ?  ?I discussed the assessment and treatment plan with the patient. The patient was provided an opportunity to ask questions and all were answered. The patient agreed with the plan and demonstrated an understanding of the instructions. ?  ?The patient was advised to call back or seek an in-person evaluation if the symptoms worsen or if the condition fails to improve as anticipated. ? ?I provided 60 minutes of non-face-to-face time during this encounter. ? ? ?Anna AxeWhitney J Eydan Barr, [facility] ? ? ? ? ? ?Comprehensive Clinical Assessment (CCA) Note ? ?10/07/2021 ?[name] ?[id number] ? ?Chief Complaint:  ?Chief Complaint  ?Patient presents with  ? Depression  ? Anxiety  ? Other  ?  grief  ? ?Visit Diagnosis: MDD  ? ? ?CCA Screening, Triage and Referral (STR) ? ?Patient Reported Information ?How did you hear about us? Other (Comment) ? ?Referral name: Google ? ?Referral phone number: No data recorded ? ?Whom do you see for routine medical problems? I don't have a doctor ? ?Practice/Facility Name: No data recorded ?Practice/Facility Phone Number: No data recorded ?Name of Contact: No data recorded ?Contact Number: No data recorded ?Contact Fax Number: No data recorded ?Prescriber Name: No data recorded ?Prescriber Address (if known): No data recorded ? ?What Is the Reason for Your Visit/Call Today? depression and anxiety ? ?How Long Has This Been Causing You Problems? 1 wk - 1 month ? ?What Do You Feel Would Help You the Most  Today? Treatment for Depression or other mood problem ? ? ?Have You Recently Been in Any Inpatient Treatment (Hospital/Detox/Crisis Center/28-Day Program)? No ? ?Name/Location of Program/Hospital:No data recorded ?How Long Were You There? No data recorded ?When Were You Discharged? No data recorded ? ?Have You [facility]? Yes ? ?Who [facility] You [facility]? No data recorded ? ?Have You Recently Had Any Thoughts About Hurting Yourself? No ? ?Are You Planning to Commit Suicide/Harm Yourself At This time? No ? ? ?Have you Recently Had Thoughts About Hurting Someone Karolee Ohslse? No ? ?Explanation: No data recorded ? ?Have You Used Any Alcohol or Drugs in the Past 24 Hours? No ? ?How Long Ago Did You Use Drugs or Alcohol? No data recorded ?What Did You Use and How Much? No data recorded ? ?Do You Currently Have a Therapist/Psychiatrist? No ? ?Name of Therapist/Psychiatrist: No data recorded ? ?Have You Been Recently Discharged From Any Office Practice or Programs? No ? ?Explanation of Discharge From Practice/Program: No data recorded ? ?  ?CCA Screening Triage Referral Assessment ?Type of Contact: Tele-Assessment ? ?Is this Initial or Reassessment? Initial Assessment ? ?Date Telepsych consult ordered in [facility]:  No data recorded ?Time Telepsych consult ordered in [facility]:  No data recorded ? ?Patient Reported Information Reviewed? No data recorded ?Patient Left Without Being Seen? No data recorded ?Reason for Not Completing Assessment: No data recorded ? ?Collateral Involvement: none ? ? ?Does Patient Have a Automotive engineerCourt Appointed Legal Guardian? No data recorded ?Name and Contact of Legal Guardian: No data recorded ?If Minor  and Not Living with Parent(s), Who has Custody? No data recorded ?Is CPS involved or ever been involved? Never ? ?Is APS involved or ever been involved? Never ? ? ?Patient Determined To Be At Risk for Harm To Self or Others Based on Review of Patient Reported Information or  Presenting Complaint? No ? ?Method: No data recorded ?Availability of Means: No data recorded ?Intent: No data recorded ?Notification Required: No data recorded ?Additional Information for Danger to Others Potential: No data recorded ?Additional Comments for Danger to Others Potential: No data recorded ?Are There Guns or Other Weapons in Your Home? No data recorded ?Types of Guns/Weapons: No data recorded ?Are These Weapons Safely Secured?                            No data recorded ?Who Could Verify You Are Able To Have These Secured: No data recorded ?Do You Have any Outstanding Charges, Pending Court Dates, Parole/Probation? No data recorded ?Contacted To Inform of Risk of Harm To Self or Others: No data recorded ? ?Location of Assessment: Other (comment) ? ? ?Does Patient Present under Involuntary Commitment? No ? ?IVC Papers Initial File Date: No data recorded ? ?[place] of Residence: [place] ? ? ?Patient Currently Receiving the Following Services: Not Receiving Services ? ? ?Determination of Need: Urgent (48 hours) ? ? ?Options For Referral: Partial Hospitalization ? ? ? ? ?CCA Biopsychosocial ?Intake/Chief Complaint:  Pt reports to [facility] for assessment. 1) Grief- Stepson died of overdose 2 weeks ago. 2) Financial: Pt reports she is behind on credit card bills. 3) House: Bought a new house recently. 4) Mental Health: Pt reports she was doing OK before the death of her stepson but has ?crashed.? 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 She is unable to work from home and stay in other homes to be with grandkids. 6) Priorities: ?I don?t know what my priorities are any more.? ?Being at work 5 days a week isn?t making me happy and it didn?t benefit [name] (stepson). It?s not enriching anyone else?s life.? Pt reports history of hospitalizations, last being 7 years ago, for depression, anxiety, and cocaine use. 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 Pt reports 1 attempt by overdose in  college after Mom died. 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 Pt states family history includes older brother with anxiety and dad with alcohol abuse. Pt reports supports include husband, 3 daughters, and church. Pt shares she had gastric bypass 2 years ago and has increased appetite recently with symptoms. Pt denies SI/HI/AVH ? ?Current Symptoms/Problems: Anger outbursts; irritability; lack motivation; don?t want to get out of bed; lacks concentration; don?t want to go to work; excessive worrying about things not getting better; increased appetite; struggling to stay asleep (very broken; 5ish hours); anhedonia; ADLs decreased (cleaning, laundry, cat boxes, forced to shower for work); decrease in sexual interest; gained 3lbs in 2 weeks ? ? ?Patient Reported Schizophrenia/Schizoaffective Diagnosis in Past: No ? ? 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 ?Preferences: to feel better ? ?Abilities: can attend and participate in treatment ? ? ?Type of Services Patient Feels are Needed: PHP ? ? ?Initial Clinical Notes/Concerns: No data recorded ? ?Mental Health Symptoms ?Depression:   ?Irritability; Weight gain/loss; Increase/decrease in appetite; Change in energy/activity; Difficulty Concentrating; Tearfulness; Fatigue; Sleep (too much or little) ?  ?Duration of Depressive symptoms:  ?Greater than two weeks ?  ?Mania:   ?  None ?  ?Anxiety:    ?Difficulty concentrating; Irritability; Worrying; Sleep; Fatigue ?  ?Psychosis:   ?None ?  ?Duration of Psychotic symptoms: No data recorded  ?Trauma:   ?None ?  ?Obsessions:   ?None ?  ?Compulsions:   ?None ?  ?Inattention:   ?None ?  ?Hyperactivity/Impulsivity:   ?None ?  ?Oppositional/Defiant Behaviors:   ?None ?  ?Emotional Irregularity:   ?None ?  ?Other Mood/Personality Symptoms:  No data recorded  ? ?Mental Status Exam ?Appearance and self-care  ?Stature:   ?Average ?  ?Weight:   ?Average weight ?  ?Clothing:   ?Casual ?  ?Grooming:   ?Normal ?  ?Cosmetic use:    ?None ?  ?Posture/gait:   ?Normal ?  ?Motor activity:   ?Not Remarkable ?  ?Sensorium  ?Attention:   ?Distractible ?  ?Concentration:   ?Anxiety interferes ?  ?Orientation:   ?X5 ?  ?Recall/memory:   ?Normal ?  ?Affect

## 2021-10-08 ENCOUNTER — Encounter (HOSPITAL_COMMUNITY): Payer: Self-pay

## 2021-10-08 ENCOUNTER — Other Ambulatory Visit (HOSPITAL_COMMUNITY): Payer: BC Managed Care – PPO | Admitting: Licensed Clinical Social Worker

## 2021-10-08 ENCOUNTER — Other Ambulatory Visit (HOSPITAL_COMMUNITY): Payer: BC Managed Care – PPO

## 2021-10-08 DIAGNOSIS — F419 Anxiety disorder, unspecified: Secondary | ICD-10-CM | POA: Diagnosis not present

## 2021-10-08 DIAGNOSIS — F332 Major depressive disorder, recurrent severe without psychotic features: Secondary | ICD-10-CM | POA: Diagnosis not present

## 2021-10-08 DIAGNOSIS — Z634 Disappearance and death of family member: Secondary | ICD-10-CM | POA: Diagnosis not present

## 2021-10-08 DIAGNOSIS — Z9151 Personal history of suicidal behavior: Secondary | ICD-10-CM | POA: Diagnosis not present

## 2021-10-08 DIAGNOSIS — R4589 Other symptoms and signs involving emotional state: Secondary | ICD-10-CM

## 2021-10-08 MED ORDER — TRAZODONE HCL 50 MG PO TABS: 50.0000 mg | ORAL_TABLET | Freq: Every day | ORAL | 0 refills | Status: DC

## 2021-10-08 NOTE — Therapy (Signed)
[facility] ?[facility] ?[place], [place], [address] ?Phone: [phone]   Fax:  [phone] ? ?Occupational Therapy Treatment ?Virtual Visit via Video Note ? ?I connected with [name] on 10/08/21 at  8:00 AM EDT by a video enabled telemedicine application and verified that I am speaking with the correct person using two identifiers. ? ?Location: ?Patient: home ?Provider: office ?  ?I discussed the limitations of evaluation and management by telemedicine and the availability of in person appointments. The patient expressed understanding and agreed to proceed. ? ?  ?The patient was advised to call back or seek an in-person evaluation if the symptoms worsen or if the condition fails to improve as anticipated. ? ?I provided 55 minutes of non-face-to-face time during this encounter. ? ? ?Patient Details  ?Name: [name] ?MRN: [id number] ?Date of Birth: 09/03/1970 ?No data recorded ? ?Encounter Date: 10/08/2021 ? ? OT End of Session - 10/08/21 2059   ? ? Visit Number 3   ? Number of Visits 20   ? Date for OT Re-Evaluation 11/05/21   ? Authorization Type [employer] [place] PPO ADV BLUE 05/30/2021- current   Deduct 200- 0 met 25.00 copay FOR SHORT TERM REHAB   OOP 2400-180.00 met   No Co Ins   Visit limit 60- 0 met based on medical necessity PT/OT Combined   No Hulan Saas Ref# [id number]   ? Authorization - Number of Visits 60   ? OT Start Time 1200   ? OT Stop Time 7510   ? OT Time Calculation (min) 55 min   ? Equipment Utilized During Treatment power point / virtual Webex   ? Activity Tolerance Patient tolerated treatment well   ? ?  ?  ? ?  ? ? ?Past Medical History:  ?Diagnosis Date  ? Bowel obstruction ([place])   ? Hypertension   ? ? ?Past Surgical History:  ?Procedure Laterality Date  ? CHOLECYSTECTOMY    ? GASTRIC BYPASS    ? ? ?There were no vitals filed for this visit. ? ? Subjective Assessment - 10/08/21 2058   ? ? Currently in Pain?  No/denies   ? Pain Score 0-No pain   ? ?  ?  ? ?  ? ? ? ? ?Group Session: ? ?S: "I have examples of boundary practicing with my grandchildren and other family members pertaining to physical contact. I understand one of (gk) them does not like to hug. So I make accommodations for that while there are other family members that might get offended when they (gk) don't offers hugs"  ? ?O: Today's OT group session focuses on the importance of setting and maintaining healthy boundaries for individuals who suffer from depression, anxiety, grief, and loss. OT highlighted and explored the definition of boundaries and provides examples of healthy boundaries, including knowing one's limits, saying no when necessary, and communicating needs clearly. OT emphasized the benefits of healthy boundary-setting, including improved self-esteem, increased control, and enhanced overall functioning. Common difficulties in setting and adhering to healthy boundaries are also discussed, such as fear of rejection, guilt, and conflict. The group also explores the impact of boundaries on one's ADLs from an, including time management, sleep hygiene, self-care activities, and social participation. The session concludes with an assignment for the patients to practice boundary-setting skills, seek support from others, and prioritize their well-being to enhance their recovery and overall functioning. ? ?A:  ?The patient demonstrates active participation and interest in understanding  the importance of boundaries for their mental health. She actively contributed to discussions, asked questions, and provided personal and specific examples related to boundary-setting. The patient showed a willingness to explore and practice boundary-setting skills and expresses a desire to improve their overall well-being. She demonstrated an understanding of the impact of boundaries on their activities of daily living and expressed motivation to incorporate healthy  boundaries into their daily life. The patient's active engagement and receptiveness continue to indicate a potential for successful implementation of future boundary-setting strategies.  ? ?P: Continue to attend PHP OT group sessions 5x week for 4 weeks to promote daily structure, social engagement, and opportunities to develop and utilize adaptive strategies to maximize functional performance in preparation for safe transition and integration back into school, work, and the community. Plan to address topic of tbd in next OT group session. ? ? ? ? ? ? ? ? ? ? ? ? ? ? ? ? ? ? ? OT Education - 10/08/21 2058   ? ? Education Details Boundaries for Improved Activities of Daily Living part 2/2   ? Person(s) Educated Patient   ? ?  ?  ? ?  ? ? ? OT Short Term Goals - 10/06/21 2036   ? ?  ? OT SHORT TERM GOAL #1  ? Title pt will be educated and independent w/ interventions to improve psychosicial social skills and ADL/iADLs for community re-entry   ? Time 4   ? Period Weeks   ? Status New   ? Target Date 11/05/21   ?  ? OT SHORT TERM GOAL #2  ? Title Pt will demonstrate independence w/ grief mgmt in order to engage in successful community reentry at time of discharge   ? Time 4   ? Period Weeks   ? Status New   ? Target Date 11/05/21   ?  ? OT SHORT TERM GOAL #3  ? Title Pt will demonstrate independence w/ coping skills in order to engage in succesful community re-entry at time of discharge   ? Time 4   ? Period Weeks   ? Status New   ? Target Date 11/05/21   ? ?  ?  ? ?  ? ? ? ? ? ? ? ? ? ? ? Plan - 10/08/21 2100   ? ? Psychosocial Skills Coping Strategies;Habits;Interpersonal Interaction;Routines and Behaviors   ? ?  ?  ? ?  ? ? ?Patient will benefit from skilled therapeutic intervention in order to improve the following deficits and impairments:   ?  ?  ?Psychosocial Skills: Coping Strategies, Habits, Interpersonal Interaction, Routines and Behaviors ? ? ?Visit Diagnosis: ?Difficulty coping ? ? ? ?Problem List ?Patient  Active Problem List  ? Diagnosis Date Noted  ? Major depressive disorder, recurrent episode, severe ([facility]) 09/30/2021  ? ? ?Brantley Stage, OT ?10/08/2021, 9:01 PM ? ?Cornell Barman, OT ? ? ?[facility] ?[facility] ?[place], [place], [address] ?Phone: [phone]   Fax:  [phone] ? ?Name: Hannie Shoe ?MRN: [id number] ?Date of Birth: 15-May-1971 ? ?

## 2021-10-08 NOTE — Progress Notes (Signed)
?Virtual Visit via Telephone Note ? ?I connected with [name] on 10/08/21 at  9:00 AM EDT by telephone and verified that I am speaking with the correct person using two identifiers. ? ?Location: ?Patient: Home ?Provider:  Office ?  ?I discussed the limitations, risks, security and privacy concerns of performing an evaluation and management service by telephone and the availability of in person appointments. I also discussed with the patient that there may be a patient responsible charge related to this service. The patient expressed understanding and agreed to proceed. ? ? ?I discussed the assessment and treatment plan with the patient. The patient was provided an opportunity to ask questions and all were answered. The patient agreed with the plan and demonstrated an understanding of the instructions. ?  ?The patient was advised to call back or seek an in-person evaluation if the symptoms worsen or if the condition fails to improve as anticipated. ? ?I provided 15 minutes of non-face-to-face time during this encounter. ? ? ?Anna Rack, NP ? ? ? Behavioral Health Partial Program Assessment Note ? ?Date: 10/08/2021 ?Name: [name] ?MRN: [id number] ? ?Chief Complaint: Worsening depression ? ? ?Subjective:"My mental health was recently triggered by my step sons drug overdose" stated that he passed away. ? ?HPI: [name] is a 51 y.o. Caucasian female presents with depression.  She reports she has been recently triggered by the passing of her stepson.  States her stepson recently passed away from a drug overdose.  She reports she has been followed by therapy and psychiatry in the past however felt like she did not need to continue to follow-up.  States she noticed that her behavior has been inappropriate at work.  She reports poor concentration and not been able to sleep well at night.  States she has had multiple visits to [facility] which has concluded.  And continues to struggle with depression  and anxiety.  Reports she was possibly evaluated for bipolar disorder however did not like stabilization medication due to reported "facial tics."  States her last suicide attempt was 5 years ago.  She reports occasional marijuana use when she attends grade for the consult.  Reports gastric bypass surgery where she is limited to how much alcohol she can intake.  States she drinks socially but not often.  Gust initiating trazodone 25 to 50 mg p.o. nightly for insomnia.  Patient was receptive to plan.  Patient was enrolled in partial psychiatric program on 10/08/21. ? ?Primary complaints include: anxiety and depression worse.  Onset of symptoms was gradual with gradually worsening course since that time. Psychosocial Stressors include the following: financial.  ? ?I have reviewed the following documentation dated 10/08/2021: past psychiatric history, past medical history, and past social and family history ? ?Complaints of Pain: nonear ?Past Psychiatric History:  ?Past psychiatric hospitalizations  and Previous suicide attempts  ? ?Currently in treatment with  ? ?Substance Abuse History: ?marijuana ?Use of Alcohol: denied ?Use of Caffeine: denies use ?Use of over the counter:  ? ?Past Surgical History:  ?Procedure Laterality Date  ? CHOLECYSTECTOMY    ? GASTRIC BYPASS    ?  ?Past Medical History:  ?Diagnosis Date  ? Bowel obstruction ([facility])   ? Hypertension   ? ?Outpatient Encounter Medications as of 10/08/2021  ?Medication Sig  ? albuterol (VENTOLIN HFA) 108 (90 Base) MCG/ACT inhaler Inhale 2 puffs into the lungs every 4 (four) hours as needed for wheezing or shortness of breath.  ? ?No facility-administered encounter medications on file  as of 10/08/2021.  ? ?Not on File  ?Social History  ? ?Tobacco Use  ? Smoking status: Not on file  ? Smokeless tobacco: Not on file  ?Substance Use Topics  ? Alcohol use: Not on file  ? ?Functioning Relationships: good support system and good relationship with spouse or significant  other ?Education:  ? ?Review of Systems ?Constitutional: negative ? ?Objective: ? ?There were no vitals filed for this visit. ? ?Physical Exam: ? ? ?Mental Status Exam: ?Appearance:  N/A ?Psychomotor::  Within Normal Limits ?Attention span and concentration: Normal ?Behavior: calm and cooperative ?Speech:  normal volume ?Mood:  depressed and anxious ?Affect:  normal ?Thought Process:  Coherent ?Thought Content:  Logical ?Orientation:  person and place ?Cognition:  grossly intact ?Insight:  Fair ?Judgment:  Fair ?Estimate of Intelligence: Average ?Fund of knowledge: Aware of current events ?Memory: Recent and remote intact ?Abnormal movements: None ?Gait and station: Normal ? ?Assessment: ? ?Diagnosis: ?Severe episode of recurrent major depressive disorder, without psychotic features (HCC) [F33.2] ?1. Severe episode of recurrent major depressive disorder, without psychotic features (HCC)   ? ? ?Indications for admission: inpatient care required if not in partial hospital program ? ?Plan: ?Order placed for occupational therapy  ?patient enrolled in Partial Hospitalization Program, patient's current medications are to be continued, the following medications are being prescribed Trazodone 50 mg , a comprehensive treatment plan will be developed, and side effects of medications have been reviewed with patient ? ?Collaboration of Care: Medication Management AEB Trazodone  and Psychiatrist AEB  ? ?Patient/Guardian was advised Release of Information must be obtained prior to any record release in order to collaborate their care with an outside provider. Patient/Guardian was advised if they have not already done so to contact the registration department to sign all necessary forms in order for [place] to release information regarding their care.  ? ?Consent: Patient/Guardian gives verbal consent for treatment and assignment of benefits for services provided during this visit. Patient/Guardian expressed understanding and agreed to  proceed.  ? ?Treatment options and alternatives reviewed with patient and patient understands the above plan. ? ?Comments:  ? ? ? ?Anna Rack, NP ?

## 2021-10-08 NOTE — Psych (Signed)
Virtual Visit via Video Note ? ?I connected with [name] on 10/08/21 at  9:00 AM EDT by a video enabled telemedicine application and verified that I am speaking with the correct person using two identifiers. ? ?Location: ?Patient: pt's home ?Provider: clinical office ?  ?I discussed the limitations of evaluation and management by telemedicine and the availability of in person appointments. The patient expressed understanding and agreed to proceed. ?  ?I discussed the assessment and treatment plan with the patient. The patient was provided an opportunity to ask questions and all were answered. The patient agreed with the plan and demonstrated an understanding of the instructions. ?  ?The patient was advised to call back or seek an in-person evaluation if the symptoms worsen or if the condition fails to improve as anticipated. ? ?I provided 240 minutes of non-face-to-face time during this encounter. ? ? ?[name], LCSWA ? ? ?[facility] PHP THERAPIST PROGRESS NOTE ? ?[name] ?[id number] ? ? ?Session Time: 9:00 am - 10:00 am ? ?Participation Level: Active ? ?Behavioral Response: CasualAlertAnxious and Depressed ? ?Type of Therapy: Group Therapy ? ?Treatment Goals addressed: Coping ? ?Progress Towards Goals: Progressing ? ?Interventions: CBT, DBT, Solution Focused, Strength-based, Supportive, and Reframing ? ?Therapist Response: Clinician led check-in regarding current stressors and situation, and review of patient completed daily inventory. Clinician utilized active listening and empathetic response and validated patient emotions. Clinician facilitated processing group on pertinent issues.?  ? ?Summary: [name] is a 51yo female who presents with depression and anxiety symptoms. Patient arrived within time allowed. Patient rates her mood at a 4-5 on a scale of 1-10 with 10 being best. Pt reported, ?Not sleeping still.? She states she took dog to vet, got some food, had an argument in the  parking lot of [employer]. When asked about appetite, she stated that her overeating was not as bad. "It felt good to yell at someone in the [employer] parking lot, I hate to admit it. It was not an appropriate reaction." She does not have a particular topic to discuss during processing. Pt denied experiencing SI/SH thoughts since last session. Pt able to process.?Pt engaged in discussion.?  ?  ? ? ?Session Time: 10:00 am - 11:00 am ? ?Participation Level: Active ? ?Behavioral Response: CasualAlertAnxious and Depressed ? ?Type of Therapy: Group Therapy ? ?Treatment Goals addressed: Coping ? ?Progress Towards Goals: Progressing ? ?Interventions: CBT, DBT, Solution Focused, Strength-based, Supportive, and Reframing ? ?Therapist Response:  Clinician led group on transitioning out of group and practicing self-compassion. Clinician utilized CBT principles to inform discussion. ? ?Summary: Pt engaged in discussion. Pt reports "I?m not good at being compassionate to myself." States she feels guilty when she has depressive episodes and shares she also feels guilty due to her history of cocaine use. ? ? ?Session Time: 11:00 am - 12:00 pm ? ?Participation Level: Active ? ?Behavioral Response: CasualAlertAnxious and Depressed ? ?Type of Therapy: Group Therapy ? ?Treatment Goals addressed: Coping ? ?Progress Towards Goals: Progressing ? ?Interventions: CBT, DBT, Solution Focused, Strength-based, Supportive, and Reframing ? ?Therapist Response: Guest speaker Trinna Post from the [facility], who provides information about [employer] and answers pt questions. ? ?Summary: Pt present for topic and engaged presenter. ? ? ?Session Time: 12:00 pm - 1:00 pm ? ?Participation Level: Active ? ?Behavioral Response: CasualAlertAnxious and Depressed ? ?Type of Therapy: Group Therapy ? ?Treatment Goals addressed: Coping ? ?Progress Towards Goals: Progressing ? ?Interventions: CBT, DBT, Solution Focused, Strength-based, Supportive, and  Reframing ? ?  Therapist Response: 12:00 - 12:50 pm: See OT note. 12:50 - 1:00 pm: Clinician led check-out. Clinician assessed for immediate needs, medication compliance and efficacy, and safety concerns? ? ?Summary: 12:00 - 12:50 pm: See OT note. 12:50 - 1:00 pm: At check-out, patient rates her mood at a 5 on a scale of 1-10 with 10 being great.?Patient demonstrates progress as evidenced by her continued engagement in group and by being receptive to feedback. Patient denies SI/HI/self-harm thoughts at the end of group and agrees to seek help should those thoughts/feelings occur.?  ? ?Suicidal/Homicidal: Nowithout intent/plan ? ?Plan: ?Pt will continue in PHP and medication management while continuing to work on decreasing depression symptoms,?SI, and anxiety symptoms,?and increasing the ability to self manage symptoms.  ?  ?Collaboration of Care: Medication Management AEB Hillery Jacks, NP ? ?Patient/Guardian was advised Release of Information must be obtained prior to any record release in order to collaborate their care with an outside provider. Patient/Guardian was advised if they have not already done so to contact the registration department to sign all necessary forms in order for [place] to release information regarding their care.  ? ?Consent: Patient/Guardian gives verbal consent for treatment and assignment of benefits for services provided during this visit. Patient/Guardian expressed understanding and agreed to proceed.  ? ?Diagnosis: Severe episode of recurrent major depressive disorder, without psychotic features (HCC) [F33.2] ?   ?1. Severe episode of recurrent major depressive disorder, without psychotic features (HCC)   ? ? ? ? ?[facility] ?10/08/2021 ? ?

## 2021-10-09 ENCOUNTER — Encounter (HOSPITAL_COMMUNITY): Payer: Self-pay | Admitting: Family

## 2021-10-11 ENCOUNTER — Ambulatory Visit (INDEPENDENT_AMBULATORY_CARE_PROVIDER_SITE_OTHER): Payer: Self-pay | Admitting: Nurse Practitioner

## 2021-10-11 ENCOUNTER — Encounter: Payer: Self-pay | Admitting: Nurse Practitioner

## 2021-10-11 ENCOUNTER — Other Ambulatory Visit (HOSPITAL_COMMUNITY): Payer: Self-pay

## 2021-10-11 ENCOUNTER — Other Ambulatory Visit: Payer: Self-pay

## 2021-10-11 VITALS — BP 130/84 | HR 80 | Temp 98.2°F | Resp 16 | Ht 65.5 in | Wt 164.0 lb

## 2021-10-11 DIAGNOSIS — F332 Major depressive disorder, recurrent severe without psychotic features: Secondary | ICD-10-CM

## 2021-10-11 DIAGNOSIS — I10 Essential (primary) hypertension: Secondary | ICD-10-CM | POA: Diagnosis not present

## 2021-10-11 DIAGNOSIS — Z1231 Encounter for screening mammogram for malignant neoplasm of breast: Secondary | ICD-10-CM

## 2021-10-11 DIAGNOSIS — Z13220 Encounter for screening for lipoid disorders: Secondary | ICD-10-CM

## 2021-10-11 DIAGNOSIS — Z131 Encounter for screening for diabetes mellitus: Secondary | ICD-10-CM

## 2021-10-11 DIAGNOSIS — F5105 Insomnia due to other mental disorder: Secondary | ICD-10-CM

## 2021-10-11 DIAGNOSIS — Z7689 Persons encountering health services in other specified circumstances: Secondary | ICD-10-CM

## 2021-10-11 DIAGNOSIS — F99 Mental disorder, not otherwise specified: Secondary | ICD-10-CM

## 2021-10-11 NOTE — Assessment & Plan Note (Signed)
She is currently participating in [facility] behavioral health partial hospitalization program.  She does a virtual appointment every day.  She is currently taking trazodone 50 mg at nighttime.  She is going to discuss with them about stopping that medication and trying something else for sleep. ?

## 2021-10-11 NOTE — Assessment & Plan Note (Signed)
Her blood pressure today is at goal 130/84.  She is not currently on any medications.   ?

## 2021-10-11 NOTE — Progress Notes (Signed)
? ?BP 130/84   Pulse 80   Temp 98.2 ?F (36.8 ?C) (Oral)   Resp 16   Ht 5' 5.5" (1.664 m)   Wt 164 lb (74.4 kg)   SpO2 99%   BMI 26.88 kg/m?   ? ?Subjective:  ? ? Patient ID: [name], female    DOB: [birth year], 51 y.o.   MRN: [id number] ? ?HPI: ?[name] is a 51 y.o. female ? ?Chief Complaint  ?Patient presents with  ? Establish Care  ? Hypertension  ? Insomnia  ? ?Establish care: she says her last physical was about 3-4 years ago.   ?She says she did have gastric bypass surgery three years ago.  Medical history of hypertension, gastric leak after bypass surgery, skin removal, depression and insomnia.  ? ?Hypertension:  Her blood pressure was 130/84 today. She does not check her blood pressure at home.  She says she has been on metoprolol and lisinopril.  She stopped taking it a long time ago.  She denies any chest pain, shortness of breath, headaches or blurred vision.  Discussed we will keep an eye on her blood pressure. ? ?Depression/anxiety/insomnia:  She says she is currently in counseling with [facility] partial hospitalization program. She is seen daily.   She denies any suicidal thoughts. She started Wednesday last week.  She says she has been doing well with the program. She says she is having good and bad days. She says her step-son recently overdosed and it has been hard. She says been having trouble sleeping.  She says she can fall asleep but she wakes up several times during the night.  He says she is currently on trazodone 50 mg every night.  She says she does fall asleep but she cannot stay asleep.  She has an appointment today at 29 with counseling.  Gust with them if she can come off the trazodone and we can do a trial of belsomra. ? ?  10/11/2021  ?  8:45 AM 09/30/2021  ? 10:49 AM  ?Depression screen PHQ 2/9  ?Decreased Interest 2 3  ?Down, Depressed, Hopeless 1 2  ?PHQ - 2 Score 3 5  ?Altered sleeping 3 3  ?Tired, decreased energy 3 3  ?Change in appetite 3 3  ?Feeling bad or failure  about yourself  1 2  ?Trouble concentrating 2 3  ?Moving slowly or fidgety/restless 1 1  ?Suicidal thoughts 0 0  ?PHQ-9 Score 16 20  ?Difficult doing work/chores Very difficult Extremely dIfficult  ? ? ?  10/11/2021  ?  8:46 AM  ?GAD 7 : Generalized Anxiety Score  ?Nervous, Anxious, on Edge 1  ?Control/stop worrying 0  ?Worry too much - different things 1  ?Trouble relaxing 2  ?Restless 1  ?Easily annoyed or irritable 3  ?Afraid - awful might happen 0  ?Total GAD 7 Score 8  ?Anxiety Difficulty Somewhat difficult  ? ?  ? ?Relevant past medical, surgical, family and social history reviewed and updated as indicated. Interim medical history since our last visit reviewed. ?Allergies and medications reviewed and updated. ? ?Review of Systems ? ?Constitutional: Negative for fever or weight change.  ?Respiratory: Negative for cough and shortness of breath.   ?Cardiovascular: Negative for chest pain or palpitations.  ?Gastrointestinal: Negative for abdominal pain, no bowel changes.  ?Musculoskeletal: Negative for gait problem or joint swelling.  ?Skin: Negative for rash.  ?Neurological: Negative for dizziness or headache.  ?No other specific complaints in a complete review of systems (except as listed  in HPI above).  ? ?   ?Objective:  ?  ?BP 130/84   Pulse 80   Temp 98.2 ?F (36.8 ?C) (Oral)   Resp 16   Ht 5' 5.5" (1.664 m)   Wt 164 lb (74.4 kg)   SpO2 99%   BMI 26.88 kg/m?   ?Wt Readings from Last 3 Encounters:  ?10/11/21 164 lb (74.4 kg)  ?08/24/21 157 lb (71.2 kg)  ?03/02/21 150 lb (68 kg)  ?  ?Physical Exam ? ?Constitutional: Patient appears well-developed and well-nourished.  No distress.  ?HEENT: head atraumatic, normocephalic, pupils equal and reactive to light, neck supple ?Cardiovascular: Normal rate, regular rhythm and normal heart sounds.  No murmur heard. No BLE edema. ?Pulmonary/Chest: Effort normal and breath sounds normal. No respiratory distress. ?Abdominal: Soft.  There is no tenderness. ?Psychiatric:  Patient has a normal mood and affect. behavior is normal. Judgment and thought content normal.  ?Results for orders placed or performed during the hospital encounter of 03/02/21  ?Comprehensive metabolic panel  ?Result Value Ref Range  ? Sodium 139 135 - 145 mmol/L  ? Potassium 3.2 (L) 3.5 - 5.1 mmol/L  ? Chloride 106 98 - 111 mmol/L  ? CO2 28 22 - 32 mmol/L  ? Glucose, Bld 93 70 - 99 mg/dL  ? BUN 9 6 - 20 mg/dL  ? Creatinine, Ser 0.52 0.44 - 1.00 mg/dL  ? Calcium 8.4 (L) 8.9 - 10.3 mg/dL  ? Total Protein 6.1 (L) 6.5 - 8.1 g/dL  ? Albumin 3.5 3.5 - 5.0 g/dL  ? AST 18 15 - 41 U/L  ? ALT 17 0 - 44 U/L  ? Alkaline Phosphatase 75 38 - 126 U/L  ? Total Bilirubin 0.8 0.3 - 1.2 mg/dL  ? GFR, Estimated >60 >60 mL/min  ? Anion gap 5 5 - 15  ?CBC with Differential  ?Result Value Ref Range  ? WBC 3.2 (L) 4.0 - 10.5 K/uL  ? RBC 4.45 3.87 - 5.11 MIL/uL  ? Hemoglobin 12.7 12.0 - 15.0 g/dL  ? HCT 38.6 36.0 - 46.0 %  ? MCV 86.7 80.0 - 100.0 fL  ? MCH 28.5 26.0 - 34.0 pg  ? MCHC 32.9 30.0 - 36.0 g/dL  ? RDW 12.7 11.5 - 15.5 %  ? Platelets 206 150 - 400 K/uL  ? nRBC 0.0 0.0 - 0.2 %  ? Neutrophils Relative % 58 %  ? Neutro Abs 1.9 1.7 - 7.7 K/uL  ? Lymphocytes Relative 30 %  ? Lymphs Abs 1.0 0.7 - 4.0 K/uL  ? Monocytes Relative 8 %  ? Monocytes Absolute 0.3 0.1 - 1.0 K/uL  ? Eosinophils Relative 4 %  ? Eosinophils Absolute 0.1 0.0 - 0.5 K/uL  ? Basophils Relative 0 %  ? Basophils Absolute 0.0 0.0 - 0.1 K/uL  ? Immature Granulocytes 0 %  ? Abs Immature Granulocytes 0.00 0.00 - 0.07 K/uL  ? ?   ?Assessment & Plan:  ? ?Problem List Items Addressed This Visit   ? ?  ? Cardiovascular and Mediastinum  ? Hypertension - Primary  ?  Her blood pressure today is at goal 130/84.  She is not currently on any medications.   ? ?  ?  ? Relevant Orders  ? CBC with Differential/Platelet  ? COMPLETE METABOLIC PANEL WITH GFR  ?  ? Other  ? Major depressive disorder, recurrent episode, severe (HCC)  ?  She is currently participating in [facility] behavioral  [facility] partial hospitalization program.  She does a  virtual appointment every day.  She is currently taking trazodone 50 mg at nighttime.  She is going to discuss with them about stopping that medication and trying something else for sleep. ? ?  ?  ? Insomnia due to other mental disorder  ?  She is currently participating in [facility] behavioral health partial hospitalization program.  She does a virtual appointment every day.  She is currently taking trazodone 50 mg at nighttime.  She is going to discuss with them about stopping that medication and trying something else for sleep. ?  ?  ? ?Other Visit Diagnoses   ? ? Screening for cholesterol level      ? Relevant Orders  ? Lipid panel  ? Screening mammogram for breast cancer      ? Relevant Orders  ? MM Digital Screening  ? Screening for diabetes mellitus      ? Relevant Orders  ? Hemoglobin A1c  ? Encounter to establish care      ? ?  ?  ? ?Follow up plan: ?Return in about 6 months (around 04/13/2022) for cpe. ? ? ? ? ? ?

## 2021-10-12 ENCOUNTER — Encounter (HOSPITAL_COMMUNITY): Payer: Self-pay

## 2021-10-12 ENCOUNTER — Other Ambulatory Visit (HOSPITAL_COMMUNITY): Payer: BC Managed Care – PPO

## 2021-10-12 ENCOUNTER — Other Ambulatory Visit (HOSPITAL_COMMUNITY): Payer: BC Managed Care – PPO | Admitting: Licensed Clinical Social Worker

## 2021-10-12 DIAGNOSIS — R4589 Other symptoms and signs involving emotional state: Secondary | ICD-10-CM

## 2021-10-12 DIAGNOSIS — F332 Major depressive disorder, recurrent severe without psychotic features: Secondary | ICD-10-CM

## 2021-10-12 DIAGNOSIS — Z9151 Personal history of suicidal behavior: Secondary | ICD-10-CM | POA: Diagnosis not present

## 2021-10-12 DIAGNOSIS — Z634 Disappearance and death of family member: Secondary | ICD-10-CM | POA: Diagnosis not present

## 2021-10-12 DIAGNOSIS — F419 Anxiety disorder, unspecified: Secondary | ICD-10-CM | POA: Diagnosis not present

## 2021-10-12 LAB — COMPLETE METABOLIC PANEL WITH GFR
AG Ratio: 2 (calc) (ref 1.0–2.5)
ALT: 23 U/L (ref 6–29)
AST: 25 U/L (ref 10–35)
Albumin: 4.1 g/dL (ref 3.6–5.1)
Alkaline phosphatase (APISO): 99 U/L (ref 37–153)
BUN: 11 mg/dL (ref 7–25)
CO2: 30 mmol/L (ref 20–32)
Calcium: 9.2 mg/dL (ref 8.6–10.4)
Chloride: 106 mmol/L (ref 98–110)
Creat: 0.57 mg/dL (ref 0.50–1.03)
Globulin: 2.1 g/dL (calc) (ref 1.9–3.7)
Glucose, Bld: 85 mg/dL (ref 65–99)
Potassium: 4.6 mmol/L (ref 3.5–5.3)
Sodium: 143 mmol/L (ref 135–146)
Total Bilirubin: 0.7 mg/dL (ref 0.2–1.2)
Total Protein: 6.2 g/dL (ref 6.1–8.1)
eGFR: 110 mL/min/{1.73_m2} (ref >=60)

## 2021-10-12 LAB — CBC WITH DIFFERENTIAL/PLATELET
Absolute Monocytes: 254 cells/uL (ref 200–950)
Basophils Absolute: 0 cells/uL (ref 0–200)
Basophils Relative: 0 %
Eosinophils Absolute: 154 cells/uL (ref 15–500)
Eosinophils Relative: 3.2 %
HCT: 36.8 % (ref 35.0–45.0)
Hemoglobin: 12.1 g/dL (ref 11.7–15.5)
Lymphs Abs: 1474 cells/uL (ref 850–3900)
MCH: 27.2 pg (ref 27.0–33.0)
MCHC: 32.9 g/dL (ref 32.0–36.0)
MCV: 82.7 fL (ref 80.0–100.0)
MPV: 11 fL (ref 7.5–12.5)
Monocytes Relative: 5.3 %
Neutro Abs: 2918 cells/uL (ref 1500–7800)
Neutrophils Relative %: 60.8 %
Platelets: 276 10*3/uL (ref 140–400)
RBC: 4.45 10*6/uL (ref 3.80–5.10)
RDW: 13.7 % (ref 11.0–15.0)
Total Lymphocyte: 30.7 %
WBC: 4.8 10*3/uL (ref 3.8–10.8)

## 2021-10-12 LAB — LIPID PANEL
Cholesterol: 157 mg/dL (ref <200)
HDL: 67 mg/dL (ref >=50)
LDL Cholesterol (Calc): 73 mg/dL (calc)
Non-HDL Cholesterol (Calc): 90 mg/dL (calc) (ref <130)
Total CHOL/HDL Ratio: 2.3 (calc) (ref <5.0)
Triglycerides: 89 mg/dL (ref <150)

## 2021-10-12 LAB — HEMOGLOBIN A1C
Hgb A1c MFr Bld: 5.5 % of total Hgb (ref <5.7)
Mean Plasma Glucose: 111 mg/dL
eAG (mmol/L): 6.2 mmol/L

## 2021-10-12 NOTE — Progress Notes (Signed)
Spoke with patient via Webex video call, used 2 identifiers to correctly identify patient. State she has done PHP in the past at a different facility and felt the need to return. Has been dealing with depression that got worse after her step-sons death 3 weeks ago. She was having a hard time before his death since she noticed that she was no longer paying her credit card debt. She denies SI/HI or AV hallucinations. On scale 1-10 as 10 being worst she rates depression at 4 and anxiety at 1. PHQ9=16. No side effects  from medications. No issues or complaints.

## 2021-10-12 NOTE — Therapy (Signed)
[facility] ?[place], [place], [address] ?Phone: [phone]   Fax:  [phone] ? ?Occupational Therapy Treatment ? ?Virtual Visit via Video Note ? ?I connected with [name] on 10/12/21 at  8:00 AM EDT by a video enabled telemedicine application and verified that I am speaking with the correct person using two identifiers. ? ?Location: ?Patient: home ?Provider: office ?  ?I discussed the limitations of evaluation and management by telemedicine and the availability of in person appointments. The patient expressed understanding and agreed to proceed. ? ?  ?The patient was advised to call back or seek an in-person evaluation if the symptoms worsen or if the condition fails to improve as anticipated. ? ?I provided 60 minutes of non-face-to-face time during this encounter. ? ? ?Patient Details  ?Name: [name] ?MRN: [id number] ?Date of Birth: 1971/03/13 ?No data recorded ? ?Encounter Date: 10/12/2021 ? ? OT End of Session - 10/12/21 1353   ? ? Visit Number 4   ? Number of Visits 20   ? Date for OT Re-Evaluation 11/05/21   ? Authorization Type [employer] [place] PPO ADV BLUE 05/30/2021- current   Deduct 200- 0 met 25.00 copay FOR SHORT TERM REHAB   OOP 2400-180.00 met   No Co Ins   Visit limit 60- 0 met based on medical necessity PT/OT Combined   No Hulan Saas Ref# [id number]   ? Authorization - Number of Visits 60   ? OT Start Time 1100   ? OT Stop Time 1200   ? OT Time Calculation (min) 60 min   ? Equipment Utilized During Treatment power point / virtual Webex   ? Activity Tolerance Patient tolerated treatment well   ? Behavior During Therapy [facility] for tasks assessed/performed   ? ?  ?  ? ?  ? ? ?Past Medical History:  ?Diagnosis Date  ? Anxiety   ? Bowel obstruction ([place])   ? Depression   ? Hypertension   ? ? ?Past Surgical History:  ?Procedure Laterality Date  ? CHOLECYSTECTOMY    ? GASTRIC BYPASS    ? ? ?There were no vitals  filed for this visit. ? ? Subjective Assessment - 10/12/21 1352   ? ? Currently in Pain? No/denies   ? Pain Score 0-No pain   ? ?  ?  ? ?  ? ? ? ? ?Group Session: ? ?S: "I would be interested in additional information regarding routine and specifically how to incorporate low impact physical activity into my daily routines" ? ?O: The objective of the telehealth group therapy session was to discuss the significance of routines in promoting mental health and wellbeing. The therapist aimed to explore the power of routines in providing structure, stability, and predictability in individuals' lives, as well as their role in establishing healthy habits, reducing stress and anxiety, managing time effectively, achieving goals, and fostering a sense of community and social connectedness. Participants were guided to identify areas where routines could improve their mental health and were provided with tips for establishing and maintaining healthy routines. The session concluded by emphasizing the potential of routines to enhance overall quality of life. ? ?A: During the telehealth group therapy session, the patient actively participated and engaged in the discussion on the importance of routines in promoting mental health and wellbeing. The patient demonstrated a good understanding of the power of routines in providing structure, stability, and predictability in their life. They were able to identify areas  in their life where routines could contribute to improving their overall mental health. ? ?The patient showed motivation and willingness to reflect on their current habits and behaviors, recognizing the need for positive changes. They actively engaged in the session, sharing personal experiences and challenges related to establishing and maintaining healthy routines. The patient expressed a desire to reduce stress and anxiety, manage their time more effectively, and achieve their goals through the implementation of  routines. ? ?Moreover, the patient actively participated in the discussion of tips for establishing and maintaining healthy routines. They demonstrated an understanding of the importance of starting small, being consistent, and gradually increasing the complexity of their routines. The patient expressed a willingness to remain flexible and adaptable, acknowledging that adjustments might be necessary as circumstances and priorities change over time. ? ?P: Continue to attend PHP OT group sessions 5x week for 4 weeks to promote daily structure, social engagement, and opportunities to develop and utilize adaptive strategies to maximize functional performance in preparation for safe transition and integration back into school, work, and the community. Plan to address topic of tbd in next OT group session. ? ? ? ? ? ? ? ? ? ? ? ? ? ? ? ? ? ? ? OT Education - 10/12/21 1352   ? ? Education Details The Power of Routines 2/2   ? Person(s) Educated Patient   ? Methods Explanation;Demonstration;Handout   ? Comprehension Verbalized understanding   ? ?  ?  ? ?  ? ? ? OT Short Term Goals - 10/06/21 2036   ? ?  ? OT SHORT TERM GOAL #1  ? Title pt will be educated and independent w/ interventions to improve psychosicial social skills and ADL/iADLs for community re-entry   ? Time 4   ? Period Weeks   ? Status New   ? Target Date 11/05/21   ?  ? OT SHORT TERM GOAL #2  ? Title Pt will demonstrate independence w/ grief mgmt in order to engage in successful community reentry at time of discharge   ? Time 4   ? Period Weeks   ? Status New   ? Target Date 11/05/21   ?  ? OT SHORT TERM GOAL #3  ? Title Pt will demonstrate independence w/ coping skills in order to engage in succesful community re-entry at time of discharge   ? Time 4   ? Period Weeks   ? Status New   ? Target Date 11/05/21   ? ?  ?  ? ?  ? ? ? ? ? ? ? ? ? ? ? Plan - 10/12/21 1353   ? ? Psychosocial Skills Coping Strategies;Habits;Interpersonal Interaction;Routines and  Behaviors   ? ?  ?  ? ?  ? ? ?Patient will benefit from skilled therapeutic intervention in order to improve the following deficits and impairments:   ?  ?  ?Psychosocial Skills: Coping Strategies, Habits, Interpersonal Interaction, Routines and Behaviors ? ? ?Visit Diagnosis: ?Difficulty coping ? ? ? ?Problem List ?Patient Active Problem List  ? Diagnosis Date Noted  ? Hypertension 10/11/2021  ? 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  ? Major depressive disorder, recurrent episode, severe ([place]) 09/30/2021  ? ? ?Brantley Stage, OT ?10/12/2021, 1:54 PM ? ?Cornell Barman, OT ? ? ?[facility] ?[place], [place], [address] ?Phone: [phone]   Fax:  [phone] ? ?Name: Jameila Keeny ?MRN: [id number] ?Date of Birth: [birth year] ? ?

## 2021-10-12 NOTE — Progress Notes (Signed)
Virtual Visit via Video Note  I connected with [name] on 10/13/21 at  9:00 AM EDT by a video enabled telemedicine application and verified that I am speaking with the correct person using two identifiers.  Location: Patient: home Provider: Office  I discussed the limitations of evaluation and management by telemedicine and the availability of in person appointments. The patient expressed understanding and agreed to proceed.   I discussed the assessment and treatment plan with the patient. The patient was provided an opportunity to ask questions and all were answered. The patient agreed with the plan and demonstrated an understanding of the instructions.   The patient was advised to call back or seek an in-person evaluation if the symptoms worsen or if the condition fails to improve as anticipated.  I provided 15 minutes of non-face-to-face time during this encounter.   Derrill Center, NP   BH MD/PA/NP OP Progress Note  10/13/2021 10:59 AM [name]  MRN:  [id number]  Chief Complaint:  " I am still not able to stay asleep."   HPI: [name] was seen and evaluated by Webex.  He was initiated on trazodone 50 mg p.o. as needed nightly for reported sleeping issue.  States she has been taking medications as indicated over the weekend however states she has not been able to stay asleep and has no issues with falling asleep.  Discussed doubling medications to 100 mg nightly.  She was receptive to plan we will continue to monitor and manage symptoms.    She is denying suicidal homicidal ideations.  Denies auditory visual hallucinations.  [name] reports her depression 6 out of 10 with 10 being the worst.  She reports ongoing ruminations with family and work stressors.  She declined initiating any other psychotropic medications at this time. states she is learning a lot from group setting.  States she continues to work on [employer] and treatment strategies.   NP will follow-up with patient 10/15/2021 for reported sleep issues.  Support encouragement reassurance was provided.     Visit Diagnosis:    ICD-10-CM   1. Severe episode of recurrent major depressive disorder, without psychotic features ([place])  F33.2     2. Difficulty coping  R45.89       Past Psychiatric History:  Past Medical History:  Past Medical History:  Diagnosis Date   Anxiety    Bowel obstruction (HCC)    Depression    Hypertension     Past Surgical History:  Procedure Laterality Date   CHOLECYSTECTOMY     GASTRIC BYPASS      Family Psychiatric History:   Family History:  Family History  Problem Relation Age of Onset   Cancer Mother    Heart disease Father    Alcohol abuse Father    Heart disease Brother    Anxiety disorder Brother    Diabetes Paternal Grandmother     Social History:  Social History   Socioeconomic History   Marital status: Married    Spouse name: Bradly Chris   Number of children: 3   Years of education: Not on file   Highest education level: Some college, no degree  Occupational History   Not on file  Tobacco Use   Smoking status: Never   Smokeless tobacco: Never  Vaping Use   Vaping Use: Never used  Substance and Sexual Activity   Alcohol use: Yes    Comment: occassional/social   Drug use: Yes    Types: Marijuana   Sexual  activity: Not on file  Other Topics Concern   Not on file  Social History Narrative   Moved from [place] to [place] then went to [place] and back in [place] for 7 years   Social Determinants of Health   Financial Resource Strain: Not on file  Food Insecurity: Not on file  Transportation Needs: Not on file  Physical Activity: Not on file  Stress: Not on file  Social Connections: Not on file    Allergies: No Known Allergies  Metabolic Disorder Labs: Lab Results  Component Value Date   HGBA1C 5.5 10/11/2021   MPG 111 10/11/2021   No results found for: PROLACTIN Lab Results  Component Value  Date   CHOL 157 10/11/2021   TRIG 89 10/11/2021   HDL 67 10/11/2021   CHOLHDL 2.3 10/11/2021   [place] 73 10/11/2021   No results found for: TSH  Therapeutic Level Labs: No results found for: LITHIUM No results found for: VALPROATE No components found for:  CBMZ  Current Medications: Current Outpatient Medications  Medication Sig Dispense Refill   omeprazole (PRILOSEC) 40 MG capsule Take by mouth.     traZODone (DESYREL) 50 MG tablet Take 1 tablet (50 mg total) by mouth at bedtime. 30 tablet 0   albuterol (VENTOLIN HFA) 108 (90 Base) MCG/ACT inhaler Inhale 2 puffs into the lungs every 4 (four) hours as needed for wheezing or shortness of breath. (Patient not taking: Reported on 10/11/2021) 8 g 0   No current facility-administered medications for this visit.     Musculoskeletal: Strength & Muscle Tone: within normal limits Gait & Station: normal Patient leans: N/A  Psychiatric Specialty Exam: Review of Systems  HENT: Negative.    Respiratory: Negative.    Cardiovascular: Negative.   Genitourinary: Negative.   Psychiatric/Behavioral:  Positive for behavioral problems and sleep disturbance. Negative for self-injury.   All other systems reviewed and are negative.  There were no vitals taken for this visit.There is no height or weight on file to calculate BMI.  General Appearance: Negative  Eye Contact:  Good  Speech:  Clear and Coherent  Volume:  Normal  Mood:  Anxious and Depressed  Affect:  Congruent  Thought Process:  Coherent  Orientation:  Full (Time, Place, and Person)  Thought Content: Logical   Suicidal Thoughts:  No  Homicidal Thoughts:  No  Memory:  Immediate;   Good Recent;   Good  Judgement:  Good  Insight:  Good  Psychomotor Activity:  Normal  Concentration:  Concentration: Good  Recall:  Good  Fund of Knowledge: Good  Language: Good  Akathisia:  No  Handed:  Right  AIMS (if indicated): done  Assets:  Communication Skills Desire for  Improvement Social Support  ADL's:  Intact  Cognition: WNL  Sleep:  Good   Screenings: GAD-7    Flowsheet Row Office Visit from 10/11/2021 in [facility]  Total GAD-7 Score 8      PHQ2-9    Flowsheet Row Counselor from 10/12/2021 in [facility] Office Visit from 10/11/2021 in [facility] Counselor from 09/30/2021 in [facility]  PHQ-2 Total Score 3 3 5   PHQ-9 Total Score 16 16 20       Flowsheet Row Counselor from 09/30/2021 in [facility] ED from 08/25/2021 in [facility] from 03/02/2021 in [facility] No Risk No Risk No  Risk        Assessment and Plan: Continue partial hospitalization programming. -NP will continue to follow-up due to reported sleeping concerns  Collaboration of Care: Collaboration of Care: Medication Management AEB Trazodone 50 mg to 100 mg nightly as needed  Patient/Guardian was advised Release of Information must be obtained prior to any record release in order to collaborate their care with an outside provider. Patient/Guardian was advised if they have not already done so to contact the registration department to sign all necessary forms in order for [place] to release information regarding their care.   Consent: Patient/Guardian gives verbal consent for treatment and assignment of benefits for services provided during this visit. Patient/Guardian expressed understanding and agreed to proceed.    Derrill Center, NP 10/13/2021, 10:59 AM

## 2021-10-12 NOTE — Progress Notes (Signed)
Cln signed onto group to speak with pt. Cln informed her that FMLA paperwork will be sent today and also requested an emergency contact for an ROI. Pt completed two ROIs for FMLA, so cln used one that is already signed and edited it. Pt provided verbal consent for cln to correct signed ROI to include her husband, Nils Pyle [phone]), as her emergency contact in the event we have safety concerns and cannot reach pt.

## 2021-10-13 ENCOUNTER — Other Ambulatory Visit (HOSPITAL_COMMUNITY): Payer: BC Managed Care – PPO

## 2021-10-13 ENCOUNTER — Other Ambulatory Visit (HOSPITAL_COMMUNITY): Payer: BC Managed Care – PPO | Admitting: Licensed Clinical Social Worker

## 2021-10-13 ENCOUNTER — Encounter (HOSPITAL_COMMUNITY): Payer: Self-pay

## 2021-10-13 DIAGNOSIS — F332 Major depressive disorder, recurrent severe without psychotic features: Secondary | ICD-10-CM | POA: Diagnosis not present

## 2021-10-13 DIAGNOSIS — Z9151 Personal history of suicidal behavior: Secondary | ICD-10-CM | POA: Diagnosis not present

## 2021-10-13 DIAGNOSIS — F419 Anxiety disorder, unspecified: Secondary | ICD-10-CM | POA: Diagnosis not present

## 2021-10-13 DIAGNOSIS — Z634 Disappearance and death of family member: Secondary | ICD-10-CM | POA: Diagnosis not present

## 2021-10-13 DIAGNOSIS — R4589 Other symptoms and signs involving emotional state: Secondary | ICD-10-CM

## 2021-10-13 NOTE — Therapy (Signed)
[facility] ?[place], [place], [address] ?Phone: [phone]   Fax:  [phone] ? ?Occupational Therapy Treatment ? ?Virtual Visit via Video Note ? ?I connected with [name] on 10/13/21 at  8:00 AM EDT by a video enabled telemedicine application and verified that I am speaking with the correct person using two identifiers. ? ?Location: ?Patient: home ?Provider: office ?  ?I discussed the limitations of evaluation and management by telemedicine and the availability of in person appointments. The patient expressed understanding and agreed to proceed. ? ?  ?The patient was advised to call back or seek an in-person evaluation if the symptoms worsen or if the condition fails to improve as anticipated. ? ?I provided 50 minutes of non-face-to-face time during this encounter. ? ? ?Patient Details  ?Name: [name] ?MRN: [id number] ?Date of Birth: Jan 09, 1971 ?No data recorded ? ?Encounter Date: 10/13/2021 ? ? OT End of Session - 10/13/21 1833   ? ? Visit Number 5   ? Number of Visits 20   ? Date for OT Re-Evaluation 11/05/21   ? Authorization Type [employer] [place] PPO ADV BLUE 05/30/2021- current   Deduct 200- 0 met 25.00 copay FOR SHORT TERM REHAB   OOP 2400-180.00 met   No Co Ins   Visit limit 60- 0 met based on medical necessity PT/OT Combined   No Hulan Saas Ref# [id number]   ? Authorization - Number of Visits 60   ? OT Start Time 1200   ? OT Stop Time 1250   ? OT Time Calculation (min) 50 min   ? Equipment Utilized During Treatment power point / virtual Webex   ? Behavior During Therapy [facility] for tasks assessed/performed   ? ?  ?  ? ?  ? ? ?Past Medical History:  ?Diagnosis Date  ? Anxiety   ? Bowel obstruction ([place])   ? Depression   ? Hypertension   ? ? ?Past Surgical History:  ?Procedure Laterality Date  ? CHOLECYSTECTOMY    ? GASTRIC BYPASS    ? ? ?There were no vitals filed for this visit. ? ? Subjective Assessment - 10/13/21  1832   ? ? Currently in Pain? No/denies   ? Pain Score 0-No pain   ? Multiple Pain Sites No   ? ?  ?  ? ?  ? ? ?Group Session: ? ?S: Pt was actively engaged t/out session offering "thumbs up" emotes and "hearts" to indicate she was following along w/ the topic at hand and was off and on camera t/out session. Overall passive however active and engaged.  ? ?O: During the group session, the participants discussed the importance of having a growth mindset and shared personal stories of how they have bounced back from challenging situations. The cognitive domain was addressed through the presentation of a PowerPoint on resiliency, which included information on the cognitive-behavioral strategies for building resiliency, such as positive self-talk and reframing negative thoughts. The affective domain was addressed as participants were encouraged to reflect on their emotional responses to challenging situations and to identify effective coping strategies. The patient, who was an active participant, shared personal experiences and coping strategies that worked for them, demonstrating an understanding of the affective domain. The psychomotor domain was also addressed as participants were encouraged to reflect on their physical reactions to stress and to identify effective stress-reducing techniques, such as deep breathing exercises and progressive muscle relaxation. The patient demonstrated an understanding of the psychomotor  domain by sharing personal experiences of using relaxation techniques to manage stress. Overall, the session appeared to address each of the three domains, and the patient demonstrated an understanding of each domain through active participation in the group discussion. ? ?A:  The patient demonstrated an understanding of resiliency and appeared to be motivated to continue building their own resiliency skills. The therapist will continue to encourage the patient to share their experiences and strategies  in future group sessions. ? ?P: Continue to attend PHP OT group sessions 5x week for 4 weeks to promote daily structure, social engagement, and opportunities to develop and utilize adaptive strategies to maximize functional performance in preparation for safe transition and integration back into school, work, and the community. Plan to address topic of Building Resilience in Adults with Depression and Anxiety 2/2 in next OT group session. ? ? ? ? ? ? ? ? ? ? ? ? ? ? ? ? ? ? ? ? ? OT Education - 10/13/21 1832   ? ? Education Details Building Resilience in Adults with Depression and Anxiety 1/2   ? Person(s) Educated Patient   ? Methods Explanation;Demonstration;Handout   ? Comprehension Verbalized understanding   ? ?  ?  ? ?  ? ? ? OT Short Term Goals - 10/06/21 2036   ? ?  ? OT SHORT TERM GOAL #1  ? Title pt will be educated and independent w/ interventions to improve psychosicial social skills and ADL/iADLs for community re-entry   ? Time 4   ? Period Weeks   ? Status New   ? Target Date 11/05/21   ?  ? OT SHORT TERM GOAL #2  ? Title Pt will demonstrate independence w/ grief mgmt in order to engage in successful community reentry at time of discharge   ? Time 4   ? Period Weeks   ? Status New   ? Target Date 11/05/21   ?  ? OT SHORT TERM GOAL #3  ? Title Pt will demonstrate independence w/ coping skills in order to engage in succesful community re-entry at time of discharge   ? Time 4   ? Period Weeks   ? Status New   ? Target Date 11/05/21   ? ?  ?  ? ?  ? ? ? ? ? ? ? ? ? ? ? Plan - 10/13/21 1835   ? ? Psychosocial Skills Coping Strategies;Habits;Interpersonal Interaction;Routines and Behaviors   ? ?  ?  ? ?  ? ? ?Patient will benefit from skilled therapeutic intervention in order to improve the following deficits and impairments:   ?  ?  ?Psychosocial Skills: Coping Strategies, Habits, Interpersonal Interaction, Routines and Behaviors ? ? ?Visit Diagnosis: ?Difficulty coping ? ? ? ?Problem List ?Patient Active  Problem List  ? Diagnosis Date Noted  ? Hypertension 10/11/2021  ? 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  ? Major depressive disorder, recurrent episode, severe ([place]) 09/30/2021  ? ? ?Brantley Stage, OT ?10/13/2021, 6:37 PM ? ?Cornell Barman, OT ? ? ?[facility] ?[place], [place], [address] ?Phone: [phone]   Fax:  [phone] ? ?Name: Heiress Williamson ?MRN: [id number] ?Date of Birth: [birth year] ? ?

## 2021-10-14 ENCOUNTER — Other Ambulatory Visit (HOSPITAL_COMMUNITY): Payer: Self-pay

## 2021-10-14 ENCOUNTER — Ambulatory Visit (HOSPITAL_COMMUNITY): Payer: Self-pay

## 2021-10-15 ENCOUNTER — Encounter (HOSPITAL_COMMUNITY): Payer: Self-pay

## 2021-10-15 ENCOUNTER — Other Ambulatory Visit (HOSPITAL_COMMUNITY): Payer: BC Managed Care – PPO | Admitting: Licensed Clinical Social Worker

## 2021-10-15 ENCOUNTER — Other Ambulatory Visit (HOSPITAL_COMMUNITY): Payer: BC Managed Care – PPO

## 2021-10-15 DIAGNOSIS — F332 Major depressive disorder, recurrent severe without psychotic features: Secondary | ICD-10-CM

## 2021-10-15 DIAGNOSIS — Z634 Disappearance and death of family member: Secondary | ICD-10-CM | POA: Diagnosis not present

## 2021-10-15 DIAGNOSIS — F419 Anxiety disorder, unspecified: Secondary | ICD-10-CM | POA: Diagnosis not present

## 2021-10-15 DIAGNOSIS — R4589 Other symptoms and signs involving emotional state: Secondary | ICD-10-CM

## 2021-10-15 DIAGNOSIS — Z9151 Personal history of suicidal behavior: Secondary | ICD-10-CM | POA: Diagnosis not present

## 2021-10-15 NOTE — Therapy (Signed)
[place], [place], [address] Phone: [phone]   Fax:  [phone]  Occupational Therapy Treatment Virtual Visit via Video Note  I connected with [name] on 10/15/21 at  8:00 AM EDT by a video enabled telemedicine application and verified that I am speaking with the correct person using two identifiers.  Location: Patient: home Provider: office   I discussed the limitations of evaluation and management by telemedicine and the availability of in person appointments. The patient expressed understanding and agreed to proceed.    The patient was advised to call back or seek an in-person evaluation if the symptoms worsen or if the condition fails to improve as anticipated.  I provided 55 minutes of non-face-to-face time during this encounter.   Patient Details  Name: [name] MRN: [id number] Date of Birth: February 17, 1971 No data recorded  Encounter Date: 10/15/2021   OT End of Session - 10/15/21 1859     Visit Number 6    Number of Visits 20    Date for OT Re-Evaluation 11/05/21    Authorization Type [employer] [place] PPO ADV BLUE 05/30/2021- current   Deduct 200- 0 met 25.00 copay FOR SHORT TERM REHAB   OOP 2400-180.00 met   No Co Ins   Visit limit 60- 0 met based on medical necessity PT/OT Combined   No Hulan Saas Ref# [id number]    OT Start Time 1200    OT Stop Time 1255    OT Time Calculation (min) 55 min    Equipment Utilized During Treatment power point / virtual Webex    Activity Tolerance Patient tolerated treatment well             Past Medical History:  Diagnosis Date   Anxiety    Bowel obstruction ([place])    Depression    Hypertension     Past Surgical History:  Procedure Laterality Date   CHOLECYSTECTOMY     GASTRIC BYPASS      There were no vitals filed for this visit.   Subjective Assessment - 10/15/21 1858     Currently in Pain? No/denies     Pain Score 0-No pain             Group Session:  S: Learning a lot about myself and what's actually important in this life. It's not material things to the career you choose. It's the purpose you live for.   O: In this group therapy session, the objective was to explore the multifaceted concept of purpose using insights from occupational therapy, anthropology, psychology, and life coaching. The participants engaged in a deep exploration of the innate human impulse for purposeful engagement and the impact of purpose on mental and emotional well-being. The discussion centered around the challenges of modern life, including feelings of isolation despite technological advancements, the connection between purposelessness and depression/anxiety, and the importance of occupational roles in achieving fulfillment. Strategies were shared to identify areas of life where purpose is lacking and to develop immediate and actionable plans to overcome these challenges. The participants gained valuable insights into the intersection of purpose and mental health, as well as practical tools to navigate their personal journeys towards [place] lives. Overall, the session fostered a sense of empowerment and inspired the participants to take proactive steps towards aligning their lives with their true purpose.  A: During the group therapy session, Patient displayed a high level of engagement and active participation. They actively contributed to  the discussions, sharing personal insights and experiences related to the concept of purpose. Patient demonstrated a clear understanding of the relationship between purpose and mental well-being, acknowledging the importance of aligning actions with values and setting meaningful goals. They actively engaged in self-reflection exercises and expressed a commitment to incorporating strategies discussed in the session into their daily life. Patient exhibited a strong motivation to  cultivate purpose and showed a willingness to take proactive steps towards living a more purposeful and fulfilling life. Overall, Patient's active participation and enthusiasm contributed to a positive and collaborative therapeutic environment.  P: Continue to attend PHP OT group sessions 5x week for 4 weeks to promote daily structure, social engagement, and opportunities to develop and utilize adaptive strategies to maximize functional performance in preparation for safe transition and integration back into school, work, and the community. Plan to address topic of Unveiling the Path to Personal Fulfillment / Discovering Purpose 2/2 in next OT group session.                       OT Education - 10/15/21 1859     Education Details Unveiling the Path to Personal Fulfillment / Discovering Purpose 1/2    Person(s) Educated Patient    Methods Explanation;Demonstration;Handout    Comprehension Verbalized understanding              OT Short Term Goals - 10/06/21 2036       OT SHORT TERM GOAL #1   Title pt will be educated and independent w/ interventions to improve psychosicial social skills and ADL/iADLs for community re-entry    Time 4    Period Weeks    Status New    Target Date 11/05/21      OT SHORT TERM GOAL #2   Title Pt will demonstrate independence w/ grief mgmt in order to engage in successful community reentry at time of discharge    Time 4    Period Weeks    Status New    Target Date 11/05/21      OT SHORT TERM GOAL #3   Title Pt will demonstrate independence w/ coping skills in order to engage in succesful community re-entry at time of discharge    Time 4    Period Weeks    Status New    Target Date 11/05/21                      Plan - 10/15/21 1900     Psychosocial Skills Coping Strategies;Habits;Interpersonal Interaction;Routines and Behaviors             Patient will benefit from skilled therapeutic intervention in order to  improve the following deficits and impairments:       Psychosocial Skills: Coping Strategies, Habits, Interpersonal Interaction, Routines and Behaviors   Visit Diagnosis: Difficulty coping    Problem List 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   Major depressive disorder, recurrent episode, severe ([place]) 09/30/2021    Brantley Stage, OT 10/15/2021, 7:00 PM Cornell Barman, [place], [place], [address] Phone: [phone]   Fax:  [phone]  Name: Kaina Orengo MRN: [id number] Date of Birth: 15-Oct-1970

## 2021-10-18 ENCOUNTER — Other Ambulatory Visit (HOSPITAL_COMMUNITY): Payer: BC Managed Care – PPO | Admitting: Licensed Clinical Social Worker

## 2021-10-18 ENCOUNTER — Other Ambulatory Visit (HOSPITAL_COMMUNITY): Payer: BC Managed Care – PPO

## 2021-10-18 ENCOUNTER — Encounter (HOSPITAL_COMMUNITY): Payer: Self-pay

## 2021-10-18 DIAGNOSIS — F332 Major depressive disorder, recurrent severe without psychotic features: Secondary | ICD-10-CM | POA: Diagnosis not present

## 2021-10-18 DIAGNOSIS — Z634 Disappearance and death of family member: Secondary | ICD-10-CM | POA: Diagnosis not present

## 2021-10-18 DIAGNOSIS — F419 Anxiety disorder, unspecified: Secondary | ICD-10-CM | POA: Diagnosis not present

## 2021-10-18 DIAGNOSIS — R4589 Other symptoms and signs involving emotional state: Secondary | ICD-10-CM

## 2021-10-18 DIAGNOSIS — Z9151 Personal history of suicidal behavior: Secondary | ICD-10-CM | POA: Diagnosis not present

## 2021-10-18 NOTE — Therapy (Signed)
[place], [place], [address] Phone: [phone]   Fax:  [phone]  Occupational Therapy Treatment  Virtual Visit via Video Note  I connected with [name] on 10/18/21 at  8:00 AM EDT by a video enabled telemedicine application and verified that I am speaking with the correct person using two identifiers.  Location: Patient: home Provider: office   I discussed the limitations of evaluation and management by telemedicine and the availability of in person appointments. The patient expressed understanding and agreed to proceed.    The patient was advised to call back or seek an in-person evaluation if the symptoms worsen or if the condition fails to improve as anticipated.  I provided 60 minutes of non-face-to-face time during this encounter.   Patient Details  Name: [name] MRN: [id number] Date of Birth: 1970/09/29 No data recorded  Encounter Date: 10/18/2021   OT End of Session - 10/18/21 1604     Visit Number 7    Number of Visits 20    Date for OT Re-Evaluation 11/05/21    Authorization Type [employer] [place] PPO ADV BLUE 05/30/2021- current   Deduct 200- 0 met 25.00 copay FOR SHORT TERM REHAB   OOP 2400-180.00 met   No Co Ins   Visit limit 60- 0 met based on medical necessity PT/OT Combined   No Hulan Saas Ref# [id number]    Authorization - Number of Visits 60    OT Start Time 1100    OT Stop Time 1200    OT Time Calculation (min) 60 min    Equipment Utilized During Treatment power point / virtual Webex    Activity Tolerance Patient tolerated treatment well    Behavior During Therapy WFL for tasks assessed/performed             Past Medical History:  Diagnosis Date   Anxiety    Bowel obstruction ([place])    Depression    Hypertension     Past Surgical History:  Procedure Laterality Date   CHOLECYSTECTOMY     GASTRIC BYPASS      There were no vitals  filed for this visit.   Subjective Assessment - 10/18/21 1604     Currently in Pain? No/denies    Pain Score 0-No pain              Group Session:  S: "Starting to better understand the importance of finding true purpose in order to find true happiness"  O: In this group therapy session, the objective was to explore the multifaceted concept of purpose using insights from occupational therapy, anthropology, psychology, and life coaching. The participants engaged in a deep exploration of the innate human impulse for purposeful engagement and the impact of purpose on mental and emotional well-being. The discussion centered around the challenges of modern life, including feelings of isolation despite technological advancements, the connection between purposelessness and depression/anxiety, and the importance of occupational roles in achieving fulfillment. Strategies were shared to identify areas of life where purpose is lacking and to develop immediate and actionable plans to overcome these challenges. The participants gained valuable insights into the intersection of purpose and mental health, as well as practical tools to navigate their personal journeys towards [place] lives. Overall, the session fostered a sense of empowerment and inspired the participants to take proactive steps towards aligning their lives with their true purpose.  A: During the group therapy session, Patient displayed a high level  of engagement and active participation. They actively contributed to the discussions, sharing personal insights and experiences related to the concept of purpose.  Patient demonstrated a clear understanding of the relationship between purpose and mental well-being, acknowledging the importance of aligning actions with values and setting meaningful goals.   They actively engaged in self-reflection exercises and expressed a commitment to incorporating strategies discussed in the session into  their daily life. Patient exhibited a fairly strong motivation to cultivate purpose and showed a willingness to take proactive steps towards living a more purposeful and fulfilling life.   Overall, Patient's active participation and enthusiasm contributed to a positive and collaborative therapeutic environment.  P: Continue to attend PHP OT group sessions 5x week for 4 weeks to promote daily structure, social engagement, and opportunities to develop and utilize adaptive strategies to maximize functional performance in preparation for safe transition and integration back into school, work, and the community. Plan to address topic of systems and goals in next OT group session.                     OT Education - 10/18/21 1604     Education Details Unveiling the Path to Personal Fulfillment / Discovering Purpose 2/2    Person(s) Educated Patient    Methods Explanation;Demonstration;Handout    Comprehension Verbalized understanding              OT Short Term Goals - 10/06/21 2036       OT SHORT TERM GOAL #1   Title pt will be educated and independent w/ interventions to improve psychosicial social skills and ADL/iADLs for community re-entry    Time 4    Period Weeks    Status New    Target Date 11/05/21      OT SHORT TERM GOAL #2   Title Pt will demonstrate independence w/ grief mgmt in order to engage in successful community reentry at time of discharge    Time 4    Period Weeks    Status New    Target Date 11/05/21      OT SHORT TERM GOAL #3   Title Pt will demonstrate independence w/ coping skills in order to engage in succesful community re-entry at time of discharge    Time 4    Period Weeks    Status New    Target Date 11/05/21                      Plan - 10/18/21 1604     Psychosocial Skills Coping Strategies;Habits;Interpersonal Interaction;Routines and Behaviors             Patient will benefit from skilled therapeutic  intervention in order to improve the following deficits and impairments:       Psychosocial Skills: Coping Strategies, Habits, Interpersonal Interaction, Routines and Behaviors   Visit Diagnosis: Difficulty coping    Problem List 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   Major depressive disorder, recurrent episode, severe ([place]) 09/30/2021    Brantley Stage, OT 10/18/2021, 4:05 PM  Cornell Barman, [place], [place], [address] Phone: [phone]   Fax:  [phone]  Name: [name] MRN: [id number] Date of Birth: 07-08-1970

## 2021-10-19 ENCOUNTER — Encounter (HOSPITAL_COMMUNITY): Payer: Self-pay

## 2021-10-19 ENCOUNTER — Other Ambulatory Visit (HOSPITAL_COMMUNITY): Payer: BC Managed Care – PPO | Admitting: Licensed Clinical Social Worker

## 2021-10-19 ENCOUNTER — Other Ambulatory Visit (HOSPITAL_COMMUNITY): Payer: BC Managed Care – PPO

## 2021-10-19 DIAGNOSIS — F332 Major depressive disorder, recurrent severe without psychotic features: Secondary | ICD-10-CM

## 2021-10-19 DIAGNOSIS — R4589 Other symptoms and signs involving emotional state: Secondary | ICD-10-CM

## 2021-10-19 DIAGNOSIS — F419 Anxiety disorder, unspecified: Secondary | ICD-10-CM | POA: Diagnosis not present

## 2021-10-19 DIAGNOSIS — Z9151 Personal history of suicidal behavior: Secondary | ICD-10-CM | POA: Diagnosis not present

## 2021-10-19 DIAGNOSIS — Z634 Disappearance and death of family member: Secondary | ICD-10-CM | POA: Diagnosis not present

## 2021-10-19 NOTE — Progress Notes (Signed)
Virtual Visit via Video Note  I connected with [name] on 10/19/21 at  9:00 AM EDT by a video enabled telemedicine application and verified that I am speaking with the correct person using two identifiers.  Location: Patient: home Provider: Office    I discussed the limitations of evaluation and management by telemedicine and the availability of in person appointments. The patient expressed understanding and agreed to proceed.   I discussed the assessment and treatment plan with the patient. The patient was provided an opportunity to ask questions and all were answered. The patient agreed with the plan and demonstrated an understanding of the instructions.   The patient was advised to call back or seek an in-person evaluation if the symptoms worsen or if the condition fails to improve as anticipated.  I provided 15 minutes of non-face-to-face time during this encounter.   Anna Rack, NP   [facility] Out[facility] Program Discharge Summary  [name] [id number]  Admission date: 05/12/203 Discharge date: 10/19/2021  Reason for admission: per admission assessment note: [name] is a 51 y.o. Caucasian female presents with depression.  She reports she has been recently triggered by the passing of her stepson.  States her stepson recently passed away from a drug overdose.  She reports she has been followed by therapy and psychiatry in the past however felt like she did not need to continue to follow-up.  States she noticed that her behavior has been inappropriate at work.  She reports poor concentration and not been able to sleep well at night.  States she has had multiple visits to [facility] which has concluded.  And continues to struggle with depression and anxiety.  Reports she was possibly evaluated for bipolar disorder however did not like stabilization medication due to reported "facial tics."  States her last suicide attempt was 5  years ago.  She reports occasional marijuana use when she attends grade for the consult.  Reports gastric bypass surgery where she is limited to how much alcohol she can intake.  States she drinks socially but not often.  Gust initiating trazodone 25 to 50 mg p.o. nightly for insomnia.  Patient was receptive to plan.  Patient was enrolled in partial psychiatric program on 10/08/21   Progress in Program Toward Treatment Goals: Progressing -[name] attended and participated with daily group session with active and engaged participation.  Denying suicidal or homicidal ideations denies auditory visual hallucinations at discharge.  Declined any medication refills at this time.  Patient to keep all outpatient follow-up appointments  Progress (rationale):   Collaboration of Care: Medication Management AEB Trazodone and Hydroxyzine   Patient/Guardian was advised Release of Information must be obtained prior to any record release in order to collaborate their care with an outside provider. Patient/Guardian was advised if they have not already done so to contact the registration department to sign all necessary forms in order for [place] to release information regarding their care.   Consent: Patient/Guardian gives verbal consent for treatment and assignment of benefits for services provided during this visit. Patient/Guardian expressed understanding and agreed to proceed.   Anna Jacks NP  10/19/2021

## 2021-10-19 NOTE — Therapy (Signed)
[place], [place], [address] Phone: [phone]   Fax:  [phone]  Occupational Therapy Treatment Virtual Visit via Video Note  I connected with [name] on 10/19/21 at  8:00 AM EDT by a video enabled telemedicine application and verified that I am speaking with the correct person using two identifiers.  Location: Patient: home Provider: office   I discussed the limitations of evaluation and management by telemedicine and the availability of in person appointments. The patient expressed understanding and agreed to proceed.    The patient was advised to call back or seek an in-person evaluation if the symptoms worsen or if the condition fails to improve as anticipated.  I provided 60 minutes of non-face-to-face time during this encounter.   Patient Details  Name: [name] MRN: [id number] Date of Birth: Nov 14, 1970 No data recorded  Encounter Date: 10/19/2021   OT End of Session - 10/19/21 1554     Visit Number 8    Number of Visits 20    Date for OT Re-Evaluation 11/05/21    Authorization Type [employer] [place] PPO ADV BLUE 05/30/2021- current   Deduct 200- 0 met 25.00 copay FOR SHORT TERM REHAB   OOP 2400-180.00 met   No Co Ins   Visit limit 60- 0 met based on medical necessity PT/OT Combined   No Hulan Saas Ref# [id number]    Authorization - Number of Visits 60    OT Start Time 1100    OT Stop Time 1200    OT Time Calculation (min) 60 min    Equipment Utilized During Treatment power point / virtual Webex    Activity Tolerance Patient tolerated treatment well    Behavior During Therapy WFL for tasks assessed/performed             Past Medical History:  Diagnosis Date   Anxiety    Bowel obstruction ([place])    Depression    Hypertension     Past Surgical History:  Procedure Laterality Date   CHOLECYSTECTOMY     GASTRIC BYPASS      There were no vitals filed  for this visit.   Subjective Assessment - 10/19/21 1554     Currently in Pain? No/denies    Pain Score 0-No pain             Group Session:  S: "Looking forward to using everything I learned here to better improve my life going forward as this is my last day I want to thank you and reiterate that I did learn a lot about things I should have known and things I never knew at all. I look forward to revisiting the [place] presentations you created and shared with me"   O: In today's occupational therapy group, the topic of systems and goals was covered. The objective of the group was to educate patients on the importance of setting and achieving goals in their daily lives, and how utilizing systems can aid in this process. The therapist led a discussion on the benefits of having a routine, breaking tasks down into smaller, achievable goals, and prioritizing tasks based on importance. The therapist also provided examples of different systems that can be used to aid in goal setting and task management, such as utilizing a planner or task list app.  It was emphasized that having a sense of accomplishment and control over one's daily tasks can improve mood and overall mental health. Patients  were encouraged to try implementing these strategies in their daily lives and to share their experiences and successes with the group during future sessions.  A: Based on today's group, Juliann Pulse may benefit from further assistance in implementing these strategies and utilizing systems to aid in his goal setting and task management / it appears she will be discharging and will be continuing care in an OP basis.   P: Today concludes pt's PHP OT care and will discharge at this time. OT to sign off at this time.      OCCUPATIONAL THERAPY DISCHARGE SUMMARY  Visits from Start of Care: 8  Current functional level related to goals / functional outcomes: Pt independent w/ b/iADLs and w/ psychosocial skills ready  for discharge and is ready for DC as listed above.    Remaining deficits: NA   Education / Equipment: Wellsite geologist of education and topics discussed were provided to pt s/p each session / prior to DC.   Plan: Patient agrees to discharge.                     OT Education - 10/19/21 1554     Education Details Systems and Goals 1/2    Person(s) Educated Patient    Methods Explanation;Demonstration;Handout    Comprehension Verbalized understanding              OT Short Term Goals - 10/06/21 2036       OT SHORT TERM GOAL #1   Title pt will be educated and independent w/ interventions to improve psychosicial social skills and ADL/iADLs for community re-entry    Time 4    Period Weeks    Status met   Target Date 11/05/21      OT SHORT TERM GOAL #2   Title Pt will demonstrate independence w/ grief mgmt in order to engage in successful community reentry at time of discharge    Time 4    Period Weeks    Status met   Target Date 11/05/21      OT SHORT TERM GOAL #3   Title Pt will demonstrate independence w/ coping skills in order to engage in succesful community re-entry at time of discharge    Time 4    Period Weeks    Status met   Target Date 11/05/21                      Plan - 10/19/21 1555     Psychosocial Skills Coping Strategies;Habits;Interpersonal Interaction;Routines and Behaviors             Patient will benefit from skilled therapeutic intervention in order to improve the following deficits and impairments:       Psychosocial Skills: Coping Strategies, Habits, Interpersonal Interaction, Routines and Behaviors   Visit Diagnosis: Difficulty coping    Problem List 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   Major depressive disorder, recurrent episode, severe ([place]) 09/30/2021    Brantley Stage, OT 10/19/2021, 3:56 PM  [place] [facility], [name], [place], [address] Phone: [phone]   Fax:  [phone]  Name: [name] MRN: [id number] Date of Birth: 09/22/1970

## 2021-10-20 ENCOUNTER — Ambulatory Visit (HOSPITAL_COMMUNITY): Payer: Self-pay

## 2021-10-20 ENCOUNTER — Other Ambulatory Visit (HOSPITAL_COMMUNITY): Payer: Self-pay

## 2021-10-21 ENCOUNTER — Other Ambulatory Visit (HOSPITAL_COMMUNITY): Payer: Self-pay

## 2021-10-21 ENCOUNTER — Ambulatory Visit (HOSPITAL_COMMUNITY): Payer: Self-pay

## 2021-10-24 ENCOUNTER — Encounter (HOSPITAL_COMMUNITY): Payer: Self-pay | Admitting: Licensed Clinical Social Worker

## 2021-10-24 MED ORDER — TRAZODONE HCL 50 MG PO TABS: 50.0000 mg | ORAL_TABLET | Freq: Every day | ORAL | 0 refills | Status: DC

## 2021-11-01 ENCOUNTER — Encounter: Payer: Self-pay | Admitting: Nurse Practitioner

## 2021-11-02 ENCOUNTER — Other Ambulatory Visit: Payer: Self-pay | Admitting: Nurse Practitioner

## 2021-11-02 DIAGNOSIS — F5105 Insomnia due to other mental disorder: Secondary | ICD-10-CM

## 2021-11-02 MED ORDER — TRAZODONE HCL 50 MG PO TABS: 25.0000 mg | ORAL_TABLET | Freq: Every evening | ORAL | 3 refills | Status: DC | PRN

## 2021-11-02 MED ORDER — QUVIVIQ 25 MG PO TABS: 25.0000 mg | ORAL_TABLET | Freq: Every day | ORAL | 3 refills | Status: DC

## 2021-11-24 ENCOUNTER — Ambulatory Visit
Admission: EM | Admit: 2021-11-24 | Discharge: 2021-11-24 | Disposition: A | Discharge status: Home / Self Care | Payer: BC Managed Care – PPO

## 2021-11-24 ENCOUNTER — Encounter: Payer: Self-pay | Admitting: Emergency Medicine

## 2021-11-24 DIAGNOSIS — R059 Cough, unspecified: Secondary | ICD-10-CM | POA: Diagnosis not present

## 2021-11-24 DIAGNOSIS — J069 Acute upper respiratory infection, unspecified: Secondary | ICD-10-CM | POA: Diagnosis not present

## 2021-11-24 MED ORDER — BENZONATATE 100 MG PO CAPS: 200.0000 mg | ORAL_CAPSULE | Freq: Three times a day (TID) | ORAL | 0 refills | Status: DC | PRN

## 2021-11-24 MED ORDER — AMOXICILLIN-POT CLAVULANATE 875-125 MG PO TABS: 1.0000 | ORAL_TABLET | Freq: Two times a day (BID) | ORAL | 0 refills | Status: DC

## 2021-11-24 NOTE — Discharge Instructions (Signed)
Ok to take over the counter Delsym or plain Mucinex with the medication prescribed. Complete entire course of antibiotics. Hydrate well with fluids. Follow-up with PCP or return if symptoms worsen or do not improve.

## 2021-11-24 NOTE — ED Triage Notes (Signed)
Pt presents with cough and chest congestion x 1 week.  °

## 2021-11-24 NOTE — ED Provider Notes (Signed)
[name]    CSN: [id number] Arrival date & time: 11/24/21  1338      History   Chief Complaint Chief Complaint  Patient presents with   Cough    HPI [name] is a 51 y.o. female.   HPI [name] is a 51 y.o. female who complains of sneezing, sore throat, post nasal drip, nasal congestion, dry cough, headache, and hoarseness for 7 days. She denies a history of asthma although smokes marijuana occasionally. No known sick contacts. Cough is keeping her awake at night. Over the last few days cough has become productive  of mucus. Denies wheezing, SOB, or chest tightness. She has been taking over the counter cough and cold medication without relief.  Past Medical History:  Diagnosis Date   Anxiety    Bowel obstruction ([facility])    Depression    Hypertension     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   Major depressive disorder, recurrent episode, severe ([facility]) 09/30/2021    Past Surgical History:  Procedure Laterality Date   CHOLECYSTECTOMY     GASTRIC BYPASS      OB History   No obstetric history on file.      Home Medications    Prior to Admission medications   Medication Sig Start Date End Date Taking? Authorizing Provider  amoxicillin-clavulanate (AUGMENTIN) 875-125 MG tablet Take 1 tablet by mouth 2 (two) times daily. 11/24/21  Yes Bing Neighbors, FNP  benzonatate (TESSALON) 100 MG capsule Take 2 capsules (200 mg total) by mouth 3 (three) times daily as needed for cough. 11/24/21  Yes Bing Neighbors, FNP  omeprazole (PRILOSEC) 40 MG capsule Take by mouth.   Yes [provider]  traZODone (DESYREL) 50 MG tablet Take 0.5-1 tablets (25-50 mg total) by mouth at bedtime as needed for sleep. 11/02/21  Yes Berniece Salines, FNP  albuterol (VENTOLIN HFA) 108 (90 Base) MCG/ACT inhaler Inhale 2 puffs into the lungs every 4 (four) hours as needed for  wheezing or shortness of breath. Patient not taking: Reported on 10/11/2021 03/02/21   Dionne Bucy, MD  PRILOSEC OTC 20 MG tablet  08/25/21   [provider]    Family History Family History  Problem Relation Age of Onset   Cancer Mother    Heart disease Father    Alcohol abuse Father    Heart disease Brother    Anxiety disorder Brother    Diabetes Paternal Grandmother     Social History Social History   Tobacco Use   Smoking status: Never   Smokeless tobacco: Never  Vaping Use   Vaping Use: Never used  Substance Use Topics   Alcohol use: Yes    Comment: occassional/social   Drug use: Yes    Types: Marijuana     Allergies   Patient has no known allergies.   Review of Systems Review of Systems Pertinent negatives listed in HPI   Physical Exam Triage Vital Signs ED Triage Vitals  Enc Vitals Group     BP 11/24/21 1456 (!) 170/102     Pulse Rate 11/24/21 1456 70     Resp 11/24/21 1456 18     Temp 11/24/21 1456 98.2 F (36.8 C)     Temp Source 11/24/21 1456 Oral     SpO2 11/24/21 1456 95 %     Weight --      Height --  Head Circumference --      Peak Flow --      Pain Score 11/24/21 1452 4     Pain Loc --      Pain Edu? --      Excl. in GC? --    No data found.  Updated Vital Signs BP (!) 160/84 (BP Location: Left Arm)   Pulse 70   Temp 98.2 F (36.8 C) (Oral)   Resp 18   SpO2 95%   Visual Acuity Right Eye Distance:   Left Eye Distance:   Bilateral Distance:    Right Eye Near:   Left Eye Near:    Bilateral Near:     Physical Exam  General Appearance:    Alert, cooperative, no distress  HENT:   Normocephalic, ears normal, nares mucosal edema with congestion, rhinorrhea, oropharynx  w/o erythema or exudate   Eyes:    PERRL, conjunctiva/corneas clear, EOM's intact       Lungs:     Clear to auscultation bilaterally, respirations unlabored  Heart:    Regular rate and rhythm  Neurologic:   Awake, alert, oriented x 3. No  apparent focal neurological           defect.      UC Treatments / Results  Labs (all labs ordered are listed, but only abnormal results are displayed) Labs Reviewed - No data to display  EKG   Radiology No results found.  Procedures Procedures (including critical care time)  Medications Ordered in UC Medications - No data to display  Initial Impression / Assessment and Plan / UC Course  I have reviewed the triage vital signs and the nursing notes.  Pertinent labs & imaging results that were available during my care of the patient were reviewed by me and considered in my medical decision making (see chart for details).    Acute upper respiratory infection with cough Treatment per discharge medication orders.  Hydrate well with fluids.  Return if symptoms worsen or do not improve. Final Clinical Impressions(s) / UC Diagnoses   Final diagnoses:  Acute upper respiratory infection  Cough, unspecified type     Discharge Instructions      Ok to take over the counter Delsym or plain Mucinex with the medication prescribed. Complete entire course of antibiotics. Hydrate well with fluids. Follow-up with PCP or return if symptoms worsen or do not improve.       ED Prescriptions     Medication Sig Dispense Auth. Provider   amoxicillin-clavulanate (AUGMENTIN) 875-125 MG tablet Take 1 tablet by mouth 2 (two) times daily. 20 tablet Bing Neighbors, FNP   benzonatate (TESSALON) 100 MG capsule Take 2 capsules (200 mg total) by mouth 3 (three) times daily as needed for cough. 40 capsule Bing Neighbors, FNP      PDMP not reviewed this encounter.   Bing Neighbors, FNP 11/25/21 [phone]

## 2021-11-25 ENCOUNTER — Other Ambulatory Visit: Payer: Self-pay | Admitting: Nurse Practitioner

## 2021-11-25 ENCOUNTER — Encounter: Payer: Self-pay | Admitting: Nurse Practitioner

## 2021-11-25 DIAGNOSIS — R051 Acute cough: Secondary | ICD-10-CM

## 2021-11-25 MED ORDER — HYDROCOD POLI-CHLORPHE POLI ER 10-8 MG/5ML PO SUER: 5.0000 mL | Freq: Two times a day (BID) | ORAL | 0 refills | Status: DC | PRN

## 2021-12-05 ENCOUNTER — Encounter: Payer: Self-pay | Admitting: Plastic Surgery

## 2021-12-05 NOTE — Telephone Encounter (Signed)
Left message to call office for appointment change. 

## 2021-12-21 ENCOUNTER — Telehealth: Payer: Self-pay | Admitting: Nurse Practitioner

## 2021-12-21 NOTE — Telephone Encounter (Signed)
Medication was changed to Trazodone. (Change in therapy)

## 2021-12-21 NOTE — Telephone Encounter (Signed)
Caller noticed prior authorization for quviviq was cancelled. Caller would like to know was this done by accident or was a notice of determination received. Caller would like an update.

## 2021-12-22 ENCOUNTER — Institutional Professional Consult (permissible substitution): Payer: BC Managed Care – PPO | Admitting: Plastic Surgery

## 2022-01-05 DIAGNOSIS — L089 Local infection of the skin and subcutaneous tissue, unspecified: Secondary | ICD-10-CM | POA: Diagnosis not present

## 2022-01-05 DIAGNOSIS — S80219A Abrasion, unspecified knee, initial encounter: Secondary | ICD-10-CM | POA: Diagnosis not present

## 2022-01-05 DIAGNOSIS — J3489 Other specified disorders of nose and nasal sinuses: Secondary | ICD-10-CM | POA: Diagnosis not present

## 2022-01-05 DIAGNOSIS — Z23 Encounter for immunization: Secondary | ICD-10-CM | POA: Diagnosis not present

## 2022-01-05 DIAGNOSIS — R0981 Nasal congestion: Secondary | ICD-10-CM | POA: Diagnosis not present

## 2022-01-10 ENCOUNTER — Ambulatory Visit (INDEPENDENT_AMBULATORY_CARE_PROVIDER_SITE_OTHER): Payer: BC Managed Care – PPO

## 2022-01-10 ENCOUNTER — Ambulatory Visit
Admission: EM | Admit: 2022-01-10 | Discharge: 2022-01-10 | Disposition: A | Discharge status: Home / Self Care | Payer: BC Managed Care – PPO | Attending: Family Medicine | Admitting: Family Medicine

## 2022-01-10 ENCOUNTER — Encounter: Payer: Self-pay | Admitting: *Deleted

## 2022-01-10 ENCOUNTER — Ambulatory Visit: Admit: 2022-01-10 | Discharge status: Absent without leave | Payer: BC Managed Care – PPO

## 2022-01-10 ENCOUNTER — Other Ambulatory Visit: Payer: Self-pay

## 2022-01-10 ENCOUNTER — Other Ambulatory Visit: Payer: Self-pay | Admitting: Nurse Practitioner

## 2022-01-10 ENCOUNTER — Encounter: Payer: Self-pay | Admitting: Nurse Practitioner

## 2022-01-10 DIAGNOSIS — S59909A Unspecified injury of unspecified elbow, initial encounter: Secondary | ICD-10-CM

## 2022-01-10 DIAGNOSIS — S51002A Unspecified open wound of left elbow, initial encounter: Secondary | ICD-10-CM | POA: Diagnosis not present

## 2022-01-10 DIAGNOSIS — M25522 Pain in left elbow: Secondary | ICD-10-CM | POA: Diagnosis not present

## 2022-01-10 DIAGNOSIS — S59902A Unspecified injury of left elbow, initial encounter: Secondary | ICD-10-CM | POA: Diagnosis not present

## 2022-01-10 DIAGNOSIS — R059 Cough, unspecified: Secondary | ICD-10-CM | POA: Diagnosis not present

## 2022-01-10 DIAGNOSIS — R051 Acute cough: Secondary | ICD-10-CM

## 2022-01-10 NOTE — Discharge Instructions (Signed)
X-ray is negative for fracture.  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   You can take 2 tablets of Keflex with breakfast, one at lunch and one at dinner.  Complete prednisone.  Recommend Nyquil to help with nighttime cough.  I feel sleep will improve once prednisone is completed.

## 2022-01-10 NOTE — ED Provider Notes (Signed)
Renaldo Fiddler    CSN: [id number] Arrival date & time: 01/10/22  1825      History   Chief Complaint Chief Complaint  Patient presents with   Joint Swelling    HPI Channelle Bottger is a 51 y.o. female.   HPI Patient presents today for re-evaluation of a skin wound and elbow injury she sustained one week while at her vacation home that she was seen and treated at [facility] on  01/05/22. She is also concerned about a viral URI with cough that has not improved since the onset 1 week ago.  Patient had an injury in which she fell while walking her dog resulting in an abrasion to the left elbow. When seen at [facility] on 01/05/22, provider was concern for possible cellulitis infection and started patient on Keflex QID x 10 days. Patient concerned that either wound is not healing or possibly broke elbow as he is having localized pain olecranon joint with flexion of the left extremity. Skin abrasion is non-draining and appears to be improving although she has localized swelling which is cause of her concern.  Patient maintains full ROM of left arm.  URI, patient is currently taking a steroid dose pack and prescribed Guaifenesin with codeine. Reports cough is still present and cough medicine ineffective.  She is requesting something stronger that her PCP prescribed previously. Reports cough is worse with lying down. C[place]. She is negative of any other symptoms. She has not history of asthma or reactive airway.   Past Medical History:  Diagnosis Date   Anxiety    Bowel obstruction (H[facility])    Depression    Hypertension     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   Major depressive disorder, recurrent episode, severe ([facility]) 09/30/2021    Past Surgical History:  Procedure Laterality Date   CHOLECYSTECTOMY     GASTRIC BYPASS      OB History   No obstetric  history on file.      Home Medications    Prior to Admission medications   Medication Sig Start Date End Date Taking? Authorizing Provider  albuterol (VENTOLIN HFA) 108 (90 Base) MCG/ACT inhaler Inhale 2 puffs into the lungs every 4 (four) hours as needed for wheezing or shortness of breath. Patient not taking: Reported on 10/11/2021 03/02/21   Dionne Bucy, MD  amoxicillin-clavulanate (AUGMENTIN) 875-125 MG tablet Take 1 tablet by mouth 2 (two) times daily. 11/24/21   Bing Neighbors, FNP  benzonatate (TESSALON) 100 MG capsule Take 2 capsules (200 mg total) by mouth 3 (three) times daily as needed for cough. 11/24/21   Bing Neighbors, FNP  chlorpheniramine-HYDROcodone [facility] PENNKINETIC ER) 10-8 MG/5ML Take 5 mLs by mouth every 12 (twelve) hours as needed for cough. 11/25/21   Berniece Salines, FNP  omeprazole (PRILOSEC) 40 MG capsule Take 1 capsule (40 mg total) by mouth daily. 01/11/22   Berniece Salines, FNP  traZODone (DESYREL) 50 MG tablet Take 0.5-1 tablets (25-50 mg total) by mouth at bedtime as needed for sleep. 11/02/21   Berniece Salines, FNP    Family History Family History  Problem Relation Age of Onset   Cancer Mother    Heart disease Father    Alcohol abuse Father    Heart disease Brother    Anxiety disorder Brother    Diabetes Paternal Grandmother  Social History Social History   Tobacco Use   Smoking status: Never   Smokeless tobacco: Never  Vaping Use   Vaping Use: Never used  Substance Use Topics   Alcohol use: Yes    Comment: occassional/social   Drug use: Yes    Types: Marijuana     Allergies   Patient has no known allergies.   Review of Systems Review of Systems Pertinent negatives listed in HPI   Physical Exam Triage Vital Signs ED Triage Vitals  Enc Vitals Group     BP 01/10/22 1937 (!) 159/97     Pulse Rate 01/10/22 1937 (!) 56     Resp 01/10/22 1937 18     Temp 01/10/22 1937 98.7 F (37.1 C)     Temp src --      SpO2  01/10/22 1937 99 %     Weight --      Height --      Head Circumference --      Peak Flow --      Pain Score 01/10/22 1933 8     Pain Loc --      Pain Edu? --      Excl. in GC? --    No data found.  Updated Vital Signs BP (!) 159/97   Pulse (!) 56   Temp 98.7 F (37.1 C)   Resp 18   SpO2 99%   Visual Acuity Right Eye Distance:   Left Eye Distance:   Bilateral Distance:    Right Eye Near:   Left Eye Near:    Bilateral Near:     Physical Exam Constitutional:      Appearance: She is well-developed.  HENT:     Head: Normocephalic and atraumatic.     Nose: Nose normal.  Eyes:     Conjunctiva/sclera: Conjunctivae normal.     Pupils: Pupils are equal, round, and reactive to light.  Neck:     Thyroid: No thyromegaly.     Trachea: No tracheal deviation.  Cardiovascular:     Rate and Rhythm: Normal rate and regular rhythm.     Pulses: Normal pulses.     Heart sounds: Normal heart sounds.  Pulmonary:     Effort: Pulmonary effort is normal.     Breath sounds: Normal breath sounds. No wheezing or rhonchi.  Musculoskeletal:     Left elbow: Swelling present. Normal range of motion. Tenderness present in medial epicondyle and olecranon process.     Cervical back: Normal range of motion and neck supple.     Comments: Skin abrasion, left elbow with trace swelling at boarder of wound. No increased warmth or erythema present   Skin:    General: Skin is warm and dry.  Neurological:     General: No focal deficit present.     Mental Status: She is alert and oriented to person, place, and time.      UC Treatments / Results  Labs (all labs ordered are listed, but only abnormal results are displayed) Labs Reviewed - No data to display  EKG   Radiology DG Elbow Complete Left  Result Date: 01/10/2022 CLINICAL DATA:  Fall several days ago with elbow pain, initial encounter EXAM: LEFT ELBOW - COMPLETE 3+ VIEW COMPARISON:  None Available. FINDINGS: No significant joint  effusion is seen. No acute fracture or dislocation is noted. No soft tissue changes are seen. Mild degenerative changes of the articulation of the humerus and ulna are noted. IMPRESSION: Mild degenerative change without  acute abnormality. Electronically Signed   By: Alcide Clever M.D.   On: 01/10/2022 19:57    Procedures Procedures (including critical care time)  Medications Ordered in UC Medications - No data to display  Initial Impression / Assessment and Plan / UC Course  I have reviewed the triage vital signs and the nursing notes.  Pertinent labs & imaging results that were available during my care of the patient were reviewed by me and considered in my medical decision making (see chart for details).    Evaluated patients URI symptoms, patients lung exam completely unremarkable and nares patient therefore suspect initial URI has resolved and patient is likely experiencing post viral cough, which can be appropriately managed with OTC medication and there is no indication for any additional anti-tussive medications. Advised that difficulty sleeping is most likely related to treatment with steroids. Recommend benadryl or Nyquil to assist with facilitation of sleep. Imaging of left elbow unremarkable with the exception of mild arthritis. Patient concerned regarding ongoing pain, advised localized pain will subside as abrasion wound completely heals. No evidence of skin infection however advised to complete all antibiotics. Follow-up with PCP as needed.  Final Clinical Impressions(s) / UC Diagnoses  .    Final diagnoses:  Elbow injury, initial encounter  Cough, unspecified type  Elbow wound, left, initial encounter     Discharge Instructions      X-ray is negative for fracture.  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   You can take 2 tablets of Keflex with breakfast, one at lunch and one at dinner.  Complete prednisone.  Recommend Nyquil to help with nighttime cough.  I  feel sleep will improve once prednisone is completed.       ED Prescriptions   None    PDMP not reviewed this encounter.   Bing Neighbors, FNP 01/12/22 [phone]

## 2022-01-10 NOTE — ED Triage Notes (Signed)
Pt reports falling last Monday and has a healing abrasion to Lt arm. Pt reports pain located in Lt elbow.

## 2022-01-11 ENCOUNTER — Other Ambulatory Visit: Payer: Self-pay | Admitting: Nurse Practitioner

## 2022-01-11 DIAGNOSIS — K219 Gastro-esophageal reflux disease without esophagitis: Secondary | ICD-10-CM

## 2022-01-11 MED ORDER — OMEPRAZOLE 40 MG PO CPDR: 40.0000 mg | DELAYED_RELEASE_CAPSULE | Freq: Every day | ORAL | 1 refills | Status: DC

## 2022-01-12 ENCOUNTER — Telehealth (INDEPENDENT_AMBULATORY_CARE_PROVIDER_SITE_OTHER): Payer: Self-pay | Admitting: Nurse Practitioner

## 2022-01-12 ENCOUNTER — Ambulatory Visit: Payer: Self-pay | Admitting: Nurse Practitioner

## 2022-01-12 DIAGNOSIS — M25522 Pain in left elbow: Secondary | ICD-10-CM

## 2022-01-12 DIAGNOSIS — R051 Acute cough: Secondary | ICD-10-CM

## 2022-01-12 MED ORDER — HYDROCOD POLI-CHLORPHE POLI ER 10-8 MG/5ML PO SUER: 5.0000 mL | Freq: Two times a day (BID) | ORAL | 0 refills | Status: DC | PRN

## 2022-01-12 NOTE — Progress Notes (Signed)
Name: [name]   MRN: [id number]    DOB: 1971/04/29   Date:01/12/2022       Progress Note  Subjective  Chief Complaint  Chief Complaint  Patient presents with   Cough    Seen at [facility] on Mon pt would like some medication to help.    I connected with  [name]  on 01/12/22 at  8:20 AM EDT by a video enabled telemedicine application and verified that I am speaking with the correct person using two identifiers.  I discussed the limitations of evaluation and management by telemedicine and the availability of in person appointments. The patient expressed understanding and agreed to proceed with a virtual visit  Staff also discussed with the patient that there may be a patient responsible charge related to this service. Patient Location: home Provider Location: [facility] Additional Individuals present: alone  HPI  Cough/wound on left elbow: Recently seen at urgent care on 01/10/2022.  She had also been seen previously from that date at another urgent care out of town.  Patient had sustained a fall out of town due to her dogs knocking her over.  Patient states she had scraped her left elbow.  States she was doing pretty good but then she developed more pain and her wound did not look good so she went to urgent care.  They told her she had some cellulitis.  Patient reports now her left elbow is doing much better and is healing nicely.  They gave her Keflex, steroid taper and Tessalon Perles for the cough.  Patient states she was also given guaifenesin with codeine.  She says that did not help her cough at all.  She states she did do a home COVID test which was negative.  Patient denies any fever at this time or shortness of breath.  Patient states she has completed the steroid taper.  Patient states she still has this cough that keeps her up at night.  We will send in some Tussionex.  Discussed with patient that if no improvement recommend she seek in person appointment so I can evaluate her lung  sounds and possibly get chest x-ray.  Patient verbalized understanding.  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   Major depressive disorder, recurrent episode, severe (HCC) 09/30/2021    Social History   Tobacco Use   Smoking status: Never   Smokeless tobacco: Never  Substance Use Topics   Alcohol use: Yes    Comment: occassional/social     Current Outpatient Medications:    amoxicillin-clavulanate (AUGMENTIN) 875-125 MG tablet, Take 1 tablet by mouth 2 (two) times daily., Disp: 20 tablet, Rfl: 0   benzonatate (TESSALON) 100 MG capsule, Take 2 capsules (200 mg total) by mouth 3 (three) times daily as needed for cough., Disp: 40 capsule, Rfl: 0   cephALEXin (KEFLEX) 500 MG capsule, Take 500 mg by mouth 4 (four) times daily., Disp: , Rfl:    omeprazole (PRILOSEC) 40 MG capsule, Take 1 capsule (40 mg total) by mouth daily., Disp: 90 capsule, Rfl: 1   traZODone (DESYREL) 50 MG tablet, Take 0.5-1 tablets (25-50 mg total) by mouth at bedtime as needed for sleep., Disp: 30 tablet, Rfl: 3   albuterol (VENTOLIN HFA) 108 (90 Base) MCG/ACT inhaler, Inhale 2 puffs into the lungs every 4 (four) hours as needed for wheezing or shortness of breath. (Patient not taking: Reported on 10/11/2021), Disp: 8  g, Rfl: 0   Hydrocod Polst-Chlorphen Polst (CHLORPHENIRAMINE-HYDROCODONE) 10-8 MG/5ML, Take 5 mLs by mouth every 12 (twelve) hours as needed for cough., Disp: 115 mL, Rfl: 0  No Known Allergies  I personally reviewed active problem list, medication list, allergies, notes from last encounter with the patient/caregiver today.  ROS  Constitutional: Negative for fever or weight change.  Respiratory: positive for cough and negative for shortness of breath.   Cardiovascular: Negative for chest pain or palpitations.  Gastrointestinal: Negative for abdominal pain, no bowel changes.  Musculoskeletal: Negative for gait  problem or joint swelling.  Skin: Negative for rash.  Neurological: Negative for dizziness or headache.  No other specific complaints in a complete review of systems (except as listed in HPI above).   Objective  Virtual encounter, vitals not obtained.  There is no height or weight on file to calculate BMI.  Nursing Note and Vital Signs reviewed.  Physical Exam  Awake, alert and oriented, speaking in complete sentences  No results found for this or any previous visit (from the past 72 hour(s)).  Assessment & Plan  1. Acute cough Comments: Recently finished steroid taper.  Sent in Tussionex.  If no improvement patient to seek in person appointment for further evaluation. - Hydrocod Polst-Chlorphen Polst (CHLORPHENIRAMINE-HYDROCODONE) 10-8 MG/5ML; Take 5 mLs by mouth every 12 (twelve) hours as needed for cough.  Dispense: 115 mL; Refill: 0  2. Left elbow pain Comments: Wound is healing.    -Red flags and when to present for emergency care or RTC including fever >101.31F, chest pain, shortness of breath, new/worsening/un-resolving symptoms,  reviewed with patient at time of visit. Follow up and care instructions discussed and provided in AVS. - I discussed the assessment and treatment plan with the patient. The patient was provided an opportunity to ask questions and all were answered. The patient agreed with the plan and demonstrated an understanding of the instructions.  I provided 15 minutes of non-face-to-face time during this encounter.  Berniece Salines, FNP

## 2022-02-07 ENCOUNTER — Institutional Professional Consult (permissible substitution): Payer: BC Managed Care – PPO | Admitting: Plastic Surgery

## 2022-02-23 NOTE — Psych (Signed)
Virtual Visit via Video Note  I connected with [name] on 10/12/21 at  9:00 AM EDT by a video enabled telemedicine application and verified that I am speaking with the correct person using two identifiers.  Location: Patient: patient home Provider: clinical home office   I discussed the limitations of evaluation and management by telemedicine and the availability of in person appointments. The patient expressed understanding and agreed to proceed.  I discussed the assessment and treatment plan with the patient. The patient was provided an opportunity to ask questions and all were answered. The patient agreed with the plan and demonstrated an understanding of the instructions.   The patient was advised to call back or seek an in-person evaluation if the symptoms worsen or if the condition fails to improve as anticipated.  Pt was provided 240 minutes of non-face-to-face time during this encounter.   Lorin Glass, LCSW   [facility] PHP THERAPIST PROGRESS NOTE  [name] [id number]  Session Time: 9:00 - 10:00  Participation Level: Active  Behavioral Response: CasualAlertDepressed  Type of Therapy: Group Therapy  Treatment Goals addressed: Coping  Progress Towards Goals: Progressing  Interventions: CBT, DBT, Supportive, and Reframing  Summary: Clinician led check-in regarding current stressors and situation, and review of patient completed daily inventory. Clinician utilized active listening and empathetic response and validated patient emotions. Clinician facilitated processing group on pertinent issues.?    Summary: [name] is a 51 y.o. female who presents with depression symptoms.  Patient arrived within time allowed. Patient rates her mood at a 6 on a scale of 1-10 with 10 being best. Pt states she feels "very tired." Pt reports she was active yesterday, leaving the house for a PCP appointment and taking a short bike ride and the dogs on a walk. Pt states  she thinks she did too much. Pt reports having an "anger episode" over the weekend and "over-reacting." Pt reports taking frequent naps and staying tired. Patient able to process. Patient engaged in discussion.        Session Time: 10:00 am - 11:00 am   Participation Level: Active   Behavioral Response: CasualAlertDepressed   Type of Therapy: Group Therapy   Treatment Goals addressed: Coping   Progress Towards Goals: Progressing   Interventions: CBT, DBT, Solution Focused, Strength-based, Supportive, and Reframing   Therapist Response: Cln led discussion on anger and the way it impacts [place]. Cln utilized CBT cognitive distortion: emotional reasoning to inform discussion. Cln encouraged pt's to view anger as a warning from our bodies that something is not right. Cln utilized the anger iceberg to discuss the ways in which anger may be masking other feelings that are more difficult to express. Group discussed how they experience anger and what issues it has caused.    Therapist Response: Pt engaged in discussion and reports understanding.           Session Time: 11:00 -12:00   Participation Level: Active   Behavioral Response: CasualAlertDepressed   Type of Therapy: Group Therapy, Occupational Therapy   Treatment Goals addressed: Coping   Progress Towards Goals: Progressing   Interventions: Supportive, Education   Summary:  Occupational Therapy group led by cln E. Hollan.   Therapist Response: See OT note         Session Time: 12:00 -1:00   Participation Level: Active   Behavioral Response: CasualAlertDepressed   Type of Therapy: Group therapy   Treatment Goals addressed: Coping   Progress Towards Goals: Progressing   Interventions: CBT;  Solution focused; Supportive; Reframing   Summary: 12:00 - 12:50: Cln led discussion on healthy aggression substitutes. Cln discussed the benefits to discharging energy and adrenaline when feeling "revved up" in emotion and the  importance of balancing that discharge with safety and lack if negative consequences. Group brainstormed different ways to channel aggression in a healthy way and shared ways that have worked for them in the past. 12:50 -1:00 Clinician led check-out. Clinician assessed for immediate needs, medication compliance and efficacy, and safety concerns.   Therapist Response: 12:00 - 12:50: Pt engaged in discussion and identifies 3 options to try.  12:50 - 1:00 pm: At check-out, patient reports no immediate concerns. Patient demonstrates progress as evidenced by increased activity. Patient denies SI/HI/self-harm thoughts at the end of group.   Suicidal/Homicidal: Nowithout intent/plan  Plan: Pt will continue in PHP while working to decrease depression symptoms, increase ADLs, and increase ability to manage symptoms in a healthy manner.   Collaboration of Care: Medication Management AEB T Lewis  Patient/Guardian was advised Release of Information must be obtained prior to any record release in order to collaborate their care with an outside provider. Patient/Guardian was advised if they have not already done so to contact the registration department to sign all necessary forms in order for [place] to release information regarding their care.   Consent: Patient/Guardian gives verbal consent for treatment and assignment of benefits for services provided during this visit. Patient/Guardian expressed understanding and agreed to proceed.   Diagnosis: Severe episode of recurrent major depressive disorder, without psychotic features ([place]) [F33.2]    1. Severe episode of recurrent major depressive disorder, without psychotic features ([place])   2. Difficulty coping      Lorin Glass, LCSW

## 2022-02-23 NOTE — Psych (Signed)
Virtual Visit via Video Note  I connected with [name] on 10/13/21 at  9:00 AM EDT by a video enabled telemedicine application and verified that I am speaking with the correct person using two identifiers.  Location: Patient: patient home Provider: clinical home office   I discussed the limitations of evaluation and management by telemedicine and the availability of in person appointments. The patient expressed understanding and agreed to proceed.  I discussed the assessment and treatment plan with the patient. The patient was provided an opportunity to ask questions and all were answered. The patient agreed with the plan and demonstrated an understanding of the instructions.   The patient was advised to call back or seek an in-person evaluation if the symptoms worsen or if the condition fails to improve as anticipated.  Pt was provided 240 minutes of non-face-to-face time during this encounter.   Lorin Glass, LCSW   [facility] PHP THERAPIST PROGRESS NOTE  [name] [id number]  Session Time: 9:00 - 10:00  Participation Level: Active  Behavioral Response: CasualAlertDepressed  Type of Therapy: Group Therapy  Treatment Goals addressed: Coping  Progress Towards Goals: Progressing  Interventions: CBT, DBT, Supportive, and Reframing  Summary: Clinician led check-in regarding current stressors and situation, and review of patient completed daily inventory. Clinician utilized active listening and empathetic response and validated patient emotions. Clinician facilitated processing group on pertinent issues.?    Summary: [name] is a 51 y.o. female who presents with depression symptoms. Patient arrived within time allowed. Patient rates her mood at a 8 on a scale of 1-10 with 10 being best. Pt states she feels "okay." Pt reports she did not do chores she had planned, however did load the dishwasher this morning. Pt states she did something nice for someone  yesterday and that boosted her mood. Pt states she began sleep medication yesterday and noticed some improvement, however still work up throughout the night and has been up since 5 am.  Patient able to process. Patient engaged in discussion.        Session Time: 10:00 am - 11:00 am   Participation Level: Active   Behavioral Response: CasualAlertDepressed   Type of Therapy: Group Therapy   Treatment Goals addressed: Coping   Progress Towards Goals: Progressing   Interventions: CBT, DBT, Solution Focused, Strength-based, Supportive, and Reframing   Therapist Response: Cln led processing group for pt's current struggles. Group members shared stressors and provided support and feedback. Cln brought in topics of boundaries, healthy relationships, and unhealthy thought processes to inform discussion.   Therapist Response: Pt able to process and provide support to group.            Session Time: 11:00 -12:00   Participation Level: Active   Behavioral Response: CasualAlertDepressed   Type of Therapy: Group Therapy, Spiritual Care   Treatment Goals addressed: Coping   Progress Towards Goals: Progressing   Interventions: Supportive, Education   Summary:  Alain Marion, Chaplain, led group.   Therapist Response: Pt participated        Session Time: 12:00 -1:00   Participation Level: Active   Behavioral Response: CasualAlertDepressed   Type of Therapy: Group Therapy, OT   Treatment Goals addressed: Coping   Progress Towards Goals: Progressing   Interventions: Supportive, Education   Summary:  OT, Cornell Barman, led group. 12:50 -1:00 Clinician led check-out. Clinician assessed for immediate needs, medication compliance and efficacy, and safety concerns   Therapist Response: 12:00 - 12:50: See OT note  12:50 -  1:00 pm: At check-out, patient reports no immediate concerns. Patient demonstrates progress as evidenced by exerting herself. Patient denies SI/HI/self-harm  thoughts at the end of group.   Suicidal/Homicidal: Nowithout intent/plan  Plan: Pt will continue in PHP while working to decrease depression symptoms, increase ADLs, and increase ability to manage symptoms in a healthy manner.   Collaboration of Care: Medication Management AEB T Lewis  Patient/Guardian was advised Release of Information must be obtained prior to any record release in order to collaborate their care with an outside provider. Patient/Guardian was advised if they have not already done so to contact the registration department to sign all necessary forms in order for [place] to release information regarding their care.   Consent: Patient/Guardian gives verbal consent for treatment and assignment of benefits for services provided during this visit. Patient/Guardian expressed understanding and agreed to proceed.   Diagnosis: Severe episode of recurrent major depressive disorder, without psychotic features (HCC) [F33.2]    1. Severe episode of recurrent major depressive disorder, without psychotic features (HCC)      Donia Guiles, LCSW

## 2022-02-23 NOTE — Psych (Signed)
Virtual Visit via Video Note  I connected with [name] on 10/06/21 at  9:00 AM EDT by a video enabled telemedicine application and verified that I am speaking with the correct person using two identifiers.  Location: Patient: patient home Provider: clinical home office   I discussed the limitations of evaluation and management by telemedicine and the availability of in person appointments. The patient expressed understanding and agreed to proceed.  I discussed the assessment and treatment plan with the patient. The patient was provided an opportunity to ask questions and all were answered. The patient agreed with the plan and demonstrated an understanding of the instructions.   The patient was advised to call back or seek an in-person evaluation if the symptoms worsen or if the condition fails to improve as anticipated.  Pt was provided 240 minutes of non-face-to-face time during this encounter.   Lorin Glass, Barr   [facility] PHP THERAPIST PROGRESS NOTE  [name]  Session Time: 9:00 - 10:00  Participation Level: Active  Behavioral Response: CasualAlertDepressed  Type of Therapy: Group Therapy  Treatment Goals addressed: Coping  Progress Towards Goals: Initial  Interventions: CBT, DBT, Supportive, and Reframing  Summary: Clinician led check-in regarding current stressors and situation, and review of patient completed daily inventory. Clinician utilized active listening and empathetic response and validated patient emotions. Clinician facilitated processing group on pertinent issues.?    Summary: [name] is a 51 y.o. female who presents with depression symptoms.  Patient arrived within time allowed. Patient rates her mood at a 5 on a scale of 1-10 with 10 being best. Pt states she feels "nervous." Pt reports her depression has increased and become a big problem in her life. Pt states her sleep is not good and she is tired. Patient able to  process. Patient engaged in discussion.        Session Time: 10:00 am - 11:00 am   Participation Level: Active   Behavioral Response: CasualAlertDepressed   Type of Therapy: Group Therapy   Treatment Goals addressed: Coping   Progress Towards Goals: Progressing   Interventions: CBT, DBT, Solution Focused, Strength-based, Supportive, and Reframing   Therapist Response: Cln led processing group for pt's current struggles. Group members shared stressors and provided support and feedback. Cln brought in topics of boundaries, healthy relationships, and unhealthy thought processes to inform discussion.   Therapist Response: Pt able to process and provide support to group.            Session Time: 11:00 -12:00   Participation Level: Active   Behavioral Response: CasualAlertDepressed   Type of Therapy: Group Therapy, Spiritual Care   Treatment Goals addressed: Coping   Progress Towards Goals: Progressing   Interventions: Supportive, Education   Summary:  [name], Chaplain, led group.   Therapist Response: Pt participated        Session Time: 12:00 -1:00   Participation Level: Active   Behavioral Response: CasualAlertDepressed   Type of Therapy: Group Therapy, OT   Treatment Goals addressed: Coping   Progress Towards Goals: Progressing   Interventions: Supportive, Education   Summary:  OT, [name], led group. 12:50 -1:00 Clinician led check-out. Clinician assessed for immediate needs, medication compliance and efficacy, and safety concerns   Therapist Response: 12:00 - 12:50: See OT note  12:50 - 1:00 pm: At check-out, patient reports no immediate concerns. Patient demonstrates progress as evidenced by participation in first group session. Patient denies SI/HI/self-harm thoughts at the end of group.  Suicidal/Homicidal: Nowithout intent/plan  Plan: Pt will continue in PHP while working to decrease depression symptoms, increase ADLs, and increase  ability to manage symptoms in a healthy manner.   Collaboration of Care: Medication Management AEB T Lewis  Patient/Guardian was advised Release of Information must be obtained prior to any record release in order to collaborate their care with an outside provider. Patient/Guardian was advised if they have not already done so to contact the registration department to sign all necessary forms in order for [place] to release information regarding their care.   Consent: Patient/Guardian gives verbal consent for treatment and assignment of benefits for services provided during this visit. Patient/Guardian expressed understanding and agreed to proceed.   Diagnosis: Severe episode of recurrent major depressive disorder, without psychotic features (HCC) [F33.2]    1. Severe episode of recurrent major depressive disorder, without psychotic features (HCC)      Anna Guiles, Barr

## 2022-02-23 NOTE — Psych (Signed)
Virtual Visit via Video Note  I connected with [name] on 10/15/21 at  9:00 AM EDT by a video enabled telemedicine application and verified that I am speaking with the correct person using two identifiers.  Location: Patient: patient home Provider: clinical home office   I discussed the limitations of evaluation and management by telemedicine and the availability of in person appointments. The patient expressed understanding and agreed to proceed.  I discussed the assessment and treatment plan with the patient. The patient was provided an opportunity to ask questions and all were answered. The patient agreed with the plan and demonstrated an understanding of the instructions.   The patient was advised to call back or seek an in-person evaluation if the symptoms worsen or if the condition fails to improve as anticipated.  Pt was provided 240 minutes of non-face-to-face time during this encounter.   Lorin Glass, LCSW   [facility] PHP THERAPIST PROGRESS NOTE  [name] [id number]  Session Time: 9:00 - 10:00  Participation Level: Active  Behavioral Response: CasualAlertDepressed  Type of Therapy: Group Therapy  Treatment Goals addressed: Coping  Progress Towards Goals: Progressing  Interventions: CBT, DBT, Supportive, and Reframing  Summary: Clinician led check-in regarding current stressors and situation, and review of patient completed daily inventory. Clinician utilized active listening and empathetic response and validated patient emotions. Clinician facilitated processing group on pertinent issues.?    Summary: [name] is a 51 y.o. female who presents with depression symptoms. Patient arrived within time allowed. Patient rates her mood at a 8 on a scale of 1-10 with 10 being best. Pt states she feels "normal." Pt reports she spent yesterday resting. Pt shares that she encountered "minorly frustrating"  situations and didn't "freak out." Pt reports  feeling proud of herself. Pt states she cooked a meal. Pt continues to struggle with staying asleep. Patient able to process. Patient engaged in discussion.         Session Time: 10:00 am - 11:00 am   Participation Level: Active   Behavioral Response: CasualAlertDepressed   Type of Therapy: Group Therapy   Treatment Goals addressed: Coping   Progress Towards Goals: Progressing   Interventions: CBT, DBT, Solution Focused, Strength-based, Supportive, and Reframing   Therapist Response: Cln continued topic of DBT distress tolerance skills and the ACCEPTS distraction skill. Group reviewed P-T-S skills and discussed how they can practice them in their every day life.    Therapist Response: Pt engaged in discussion and is able to brainstorm ways to apply the skills.          Session Time: 11:00 -12:00   Participation Level: Active   Behavioral Response: CasualAlertDepressed   Type of Therapy: Group Therapy, Occupational Therapy   Treatment Goals addressed: Coping   Progress Towards Goals: Progressing   Interventions: Supportive, Education   Summary:  Occupational Therapy group led by cln E. Hollan.   Therapist Response: See OT note         Session Time: 12:00 -1:00   Participation Level: Active   Behavioral Response: CasualAlertDepressed   Type of Therapy: Group therapy   Treatment Goals addressed: Coping   Progress Towards Goals: Progressing   Interventions: CBT; Solution focused; Supportive; Reframing   Summary: 12:00 - 12:50: Cln led discussion on ways to manage stressors and feelings over the weekend. Group members  brainstormed things to do over the weekend for multiple levels of energy, access, and moods. Cln reviewed crisis services should they be needed and provided pt's with  the text crisis line, mobile crisis, national suicide hotline, [facility] 24/7 line, and information on [facility].    12:50 -1:00 Clinician led check-out. Clinician assessed for  immediate needs, medication compliance and efficacy, and safety concerns.   Therapist Response: 12:00 - 12:50: Pt engaged in discussion and is able to identify 3 ideas of what to do over the weekend to keep their mind engaged.  12:50 - 1:00 pm: At check-out, patient reports no immediate concerns. Patient demonstrates progress as evidenced by increased ability to manage mood. Patient denies SI/HI/self-harm thoughts at the end of group.   Suicidal/Homicidal: Nowithout intent/plan  Plan: Pt will continue in PHP while working to decrease depression symptoms, increase ADLs, and increase ability to manage symptoms in a healthy manner.   Collaboration of Care: Medication Management AEB T Lewis  Patient/Guardian was advised Release of Information must be obtained prior to any record release in order to collaborate their care with an outside provider. Patient/Guardian was advised if they have not already done so to contact the registration department to sign all necessary forms in order for [place] to release information regarding their care.   Consent: Patient/Guardian gives verbal consent for treatment and assignment of benefits for services provided during this visit. Patient/Guardian expressed understanding and agreed to proceed.   Diagnosis: Severe episode of recurrent major depressive disorder, without psychotic features (HCC) [F33.2]    1. Severe episode of recurrent major depressive disorder, without psychotic features (HCC)      Donia Guiles, LCSW

## 2022-02-23 NOTE — Psych (Signed)
Virtual Visit via Video Note  I connected with [name] on 10/18/21 at  9:00 AM EDT by a video enabled telemedicine application and verified that I am speaking with the correct person using two identifiers.  Location: Patient: patient home Provider: clinical home office   I discussed the limitations of evaluation and management by telemedicine and the availability of in person appointments. The patient expressed understanding and agreed to proceed.  I discussed the assessment and treatment plan with the patient. The patient was provided an opportunity to ask questions and all were answered. The patient agreed with the plan and demonstrated an understanding of the instructions.   The patient was advised to call back or seek an in-person evaluation if the symptoms worsen or if the condition fails to improve as anticipated.  Pt was provided 240 minutes of non-face-to-face time during this encounter.   Donia Guiles, LCSW   [facility] PHP THERAPIST PROGRESS NOTE  [name] [id number]  Session Time: 9:00 - 10:00  Participation Level: Active  Behavioral Response: CasualAlertDepressed  Type of Therapy: Group Therapy  Treatment Goals addressed: Coping  Progress Towards Goals: Progressing  Interventions: CBT, DBT, Supportive, and Reframing  Summary: Clinician led check-in regarding current stressors and situation, and review of patient completed daily inventory. Clinician utilized active listening and empathetic response and validated patient emotions. Clinician facilitated processing group on pertinent issues.?    Summary: [name] is a 51 y.o. female who presents with depression symptoms. Patient arrived within time allowed. Patient rates her mood at a 9 on a scale of 1-10 with 10 being best. Pt states she feels "pretty good." Pt reports she spent time outside over the weekend and got back into physical activity. Pt states struggling with anxiety in traffic and  using deep breaths to moderate. Pt reports continued issues with sleep. Patient able to process. Patient engaged in discussion.         Session Time: 10:00 am - 11:00 am   Participation Level: Active   Behavioral Response: CasualAlertDepressed   Type of Therapy: Group Therapy   Treatment Goals addressed: Coping   Progress Towards Goals: Progressing   Interventions: CBT, DBT, Solution Focused, Strength-based, Supportive, and Reframing   Therapist Response: Cln introduced topic of stress management and the model of the "4 A's of stress management:" avoid, alter, accept, and adapt. Group members worked through Engineer, structural and discussed barriers to utilizing the 4 A's for stressors.    Therapist Response: Pt engaged in discussion and reports understanding of how to utilize the 4 A's.            Session Time: 11:00 -12:00   Participation Level: Active   Behavioral Response: CasualAlertDepressed   Type of Therapy: Group Therapy, Occupational Therapy   Treatment Goals addressed: Coping   Progress Towards Goals: Progressing   Interventions: Supportive, Education   Summary:  Occupational Therapy group led by cln E. Hollan.   Therapist Response: See OT note         Session Time: 12:00 -1:00   Participation Level: Active   Behavioral Response: CasualAlertDepressed   Type of Therapy: Group therapy   Treatment Goals addressed: Coping   Progress Towards Goals: Progressing   Interventions: CBT; Solution focused; Supportive; Reframing   Summary: 12:00 - 12:50: Cln introduced topic of boundaries. Cln discussed how boundaries inform our relationships and affect self-esteem and personal agency. Group discussed the three types of boundaries: rigid, porous, and healthy and when each type is most helpful/harmful.  12:50 -1:00 Clinician led check-out. Clinician assessed for immediate needs, medication compliance and efficacy, and safety concerns.   Therapist Response: 12:00 -  12:50: Pt engaged in discussion and identifies situations in which they've been in different boundary states. 12:50 - 1:00 pm: At check-out, patient reports no immediate concerns. Patient demonstrates progress as evidenced by report of no anger issues over the weekend. Patient denies SI/HI/self-harm thoughts at the end of group.   Suicidal/Homicidal: Nowithout intent/plan  Plan: Pt will continue in PHP while working to decrease depression symptoms, increase ADLs, and increase ability to manage symptoms in a healthy manner.   Collaboration of Care: Medication Management AEB T Lewis  Patient/Guardian was advised Release of Information must be obtained prior to any record release in order to collaborate their care with an outside provider. Patient/Guardian was advised if they have not already done so to contact the registration department to sign all necessary forms in order for [place] to release information regarding their care.   Consent: Patient/Guardian gives verbal consent for treatment and assignment of benefits for services provided during this visit. Patient/Guardian expressed understanding and agreed to proceed.   Diagnosis: Severe episode of recurrent major depressive disorder, without psychotic features ([place]) [F33.2]    1. Severe episode of recurrent major depressive disorder, without psychotic features ([place])      Lorin Glass, LCSW

## 2022-02-23 NOTE — Psych (Signed)
Virtual Visit via Video Note  I connected with [name] on 10/07/21 at  9:00 AM EDT by a video enabled telemedicine application and verified that I am speaking with the correct person using two identifiers.  Location: Patient: patient home Provider: clinical home office   I discussed the limitations of evaluation and management by telemedicine and the availability of in person appointments. The patient expressed understanding and agreed to proceed.  I discussed the assessment and treatment plan with the patient. The patient was provided an opportunity to ask questions and all were answered. The patient agreed with the plan and demonstrated an understanding of the instructions.   The patient was advised to call back or seek an in-person evaluation if the symptoms worsen or if the condition fails to improve as anticipated.  Pt was provided 240 minutes of non-face-to-face time during this encounter.   Donia Guiles, LCSW   [facility] PHP THERAPIST PROGRESS NOTE  [name] [id number]  Session Time: 9:00 - 10:00  Participation Level: Active  Behavioral Response: CasualAlertDepressed  Type of Therapy: Group Therapy  Treatment Goals addressed: Coping  Progress Towards Goals: Initial  Interventions: CBT, DBT, Supportive, and Reframing  Summary: Clinician led check-in regarding current stressors and situation, and review of patient completed daily inventory. Clinician utilized active listening and empathetic response and validated patient emotions. Clinician facilitated processing group on pertinent issues.?    Summary: [name] is a 51 y.o. female who presents with depression symptoms.  Patient arrived within time allowed. Patient rates her mood at a 5 on a scale of 1-10 with 10 being best. Pt states she feels "nervous." Pt reports struggling with rumination and self-judgment yesterday for "not sharing enough" in group. Pt states increased irritability and anger  yesterday and had difficulty managing it. Pt reports interrupted and poor sleep. Pt states she has not changed clothes all week and is struggling with taking care of ADLs. Patient able to process. Patient engaged in discussion.        Session Time: 10:00 am - 11:00 am   Participation Level: Active   Behavioral Response: CasualAlertDepressed   Type of Therapy: Group Therapy   Treatment Goals addressed: Coping   Progress Towards Goals: Progressing   Interventions: CBT, DBT, Solution Focused, Strength-based, Supportive, and Reframing   Therapist Response: Cln led discussion on valuing ourselves in the same way, or more than we value others. Group viewed TED talk "The Person You Really Need to Marry" to facilitate discussion. Group discussed what it would be like to view themselves as they do a romantic partner and treat themselves with as much care.    Therapist Response: Pt engaged in discussion and is able to share struggles with valuing themselves.         Session Time: 11:00 -12:00   Participation Level: Active   Behavioral Response: CasualAlertDepressed   Type of Therapy: Group Therapy, Occupational Therapy   Treatment Goals addressed: Coping   Progress Towards Goals: Progressing   Interventions: Supportive, Education   Summary:  Occupational Therapy group led by cln E. Hollan.   Therapist Response: See OT note.         Session Time: 12:00 -1:00   Participation Level: Active   Behavioral Response: CasualAlertDepressed   Type of Therapy: Group therapy   Treatment Goals addressed: Coping   Progress Towards Goals: Progressing   Interventions: CBT; Solution focused; Supportive; Reframing   Summary: 12:00 - 12:50: Cln led discussion on upcoming holiday, Mother's Day, and group  shared how this upcoming holiday is affecting them. Cln brought in DBT distress tolerance skills, CBT reframing, and supportive themes to shape discussion. 12:50 -1:00 Clinician led  check-out. Clinician assessed for immediate needs, medication compliance and efficacy, and safety concerns.   Therapist Response: 12:00 - 12:50: Pt able to process upcoming holiday. 12:50 - 1:00 pm: At check-out, patient reports no immediate concerns. Patient demonstrates progress as evidenced by increased openness. Patient denies SI/HI/self-harm thoughts at the end of group.   Suicidal/Homicidal: Nowithout intent/plan  Plan: Pt will continue in PHP while working to decrease depression symptoms, increase ADLs, and increase ability to manage symptoms in a healthy manner.   Collaboration of Care: Medication Management AEB T Lewis  Patient/Guardian was advised Release of Information must be obtained prior to any record release in order to collaborate their care with an outside provider. Patient/Guardian was advised if they have not already done so to contact the registration department to sign all necessary forms in order for [place] to release information regarding their care.   Consent: Patient/Guardian gives verbal consent for treatment and assignment of benefits for services provided during this visit. Patient/Guardian expressed understanding and agreed to proceed.   Diagnosis: Severe episode of recurrent major depressive disorder, without psychotic features ([place]) [F33.2]    1. Severe episode of recurrent major depressive disorder, without psychotic features ([place])      Lorin Glass, LCSW

## 2022-02-25 NOTE — Psych (Signed)
Virtual Visit via Video Note  I connected with [name] on 10/19/21 at  9:00 AM EDT by a video enabled telemedicine application and verified that I am speaking with the correct person using two identifiers.  Location: Patient: patient home Provider: clinical home office   I discussed the limitations of evaluation and management by telemedicine and the availability of in person appointments. The patient expressed understanding and agreed to proceed.  I discussed the assessment and treatment plan with the patient. The patient was provided an opportunity to ask questions and all were answered. The patient agreed with the plan and demonstrated an understanding of the instructions.   The patient was advised to call back or seek an in-person evaluation if the symptoms worsen or if the condition fails to improve as anticipated.  Pt was provided 240 minutes of non-face-to-face time during this encounter.   Lorin Glass, LCSW   [facility] PHP THERAPIST PROGRESS NOTE  [name] [id number]  Session Time: 9:00 - 10:00  Participation Level: Active  Behavioral Response: CasualAlertDepressed  Type of Therapy: Group Therapy  Treatment Goals addressed: Coping  Progress Towards Goals: Progressing  Interventions: CBT, DBT, Supportive, and Reframing  Summary: Clinician led check-in regarding current stressors and situation, and review of patient completed daily inventory. Clinician utilized active listening and empathetic response and validated patient emotions. Clinician facilitated processing group on pertinent issues.?    Summary: [name] is a 51 y.o. female who presents with depression symptoms. Patient arrived within time allowed. Patient rates her mood at a 8 on a scale of 1-10 with 10 being best. Pt states she feels "nervous." Pt reports feeling "nervous but ready" to leave group today. Pt reports anxiety regarding returning to work and is able to discuss how she can  manage the situation. Pt reports some improvement in sleep. Patient able to process. Patient engaged in discussion.         Session Time: 10:00 am - 11:00 am   Participation Level: Active   Behavioral Response: CasualAlertDepressed   Type of Therapy: Group Therapy   Treatment Goals addressed: Coping   Progress Towards Goals: Progressing   Interventions: CBT, DBT, Solution Focused, Strength-based, Supportive, and Reframing   Therapist Response: Cln continued topic of boundaries. Cln discussed emotional and intellectual boundaries including they way they present and difficulties with both. Group members discussed the ways in which emotional and intellectual boundaries is a struggle for them.    Therapist Response: Pt engaged in discussion and is able to identify ways in which emotional and intellectual boundaries affect them.           Session Time: 11:00 -12:00   Participation Level: Active   Behavioral Response: CasualAlertDepressed   Type of Therapy: Group Therapy, Occupational Therapy   Treatment Goals addressed: Coping   Progress Towards Goals: Progressing   Interventions: Supportive, Education   Summary:  Occupational Therapy group led by cln E. Hollan.   Therapist Response: See OT note         Session Time: 12:00 -1:00   Participation Level: Active   Behavioral Response: CasualAlertDepressed   Type of Therapy: Group therapy   Treatment Goals addressed: Coping   Progress Towards Goals: Progressing   Interventions: CBT; Solution focused; Supportive; Reframing   Summary: 12:00 - 12:50: Cln continued topic of boundaries. Cln discussed material and time boundaries including they way they present and difficulties with both. Group members discussed the ways in which material and time boundaries is a struggle for  them.  12:50 -1:00 Clinician led check-out. Clinician assessed for immediate needs, medication compliance and efficacy, and safety concerns.    Therapist Response: 12:00 - 12:50: Pt engaged in discussion and is able to identify ways in which material and time boundaries affect them. 12:50 - 1:00 pm: At check-out, patient reports no immediate concerns. Patient demonstrates progress as evidenced by report of increased activity. Patient denies SI/HI/self-harm thoughts at the end of group.   Suicidal/Homicidal: Nowithout intent/plan  Plan: Pt will discharge from PHP due to meeting treatment goals of decreased depression symptoms, increased ADLs, and increased ability to manage symptoms in a healthy manner. Pt has declined IOP due to returning to work. Pt will return to previous providers and keep all scheduled appointments. Pt and provider aligned with discharge plan. Pt denies SI/HI at time of discharge.   Collaboration of Care: Medication Management AEB T Lewis  Patient/Guardian was advised Release of Information must be obtained prior to any record release in order to collaborate their care with an outside provider. Patient/Guardian was advised if they have not already done so to contact the registration department to sign all necessary forms in order for [place] to release information regarding their care.   Consent: Patient/Guardian gives verbal consent for treatment and assignment of benefits for services provided during this visit. Patient/Guardian expressed understanding and agreed to proceed.   Diagnosis: Severe episode of recurrent major depressive disorder, without psychotic features (HCC) [F33.2]    1. Severe episode of recurrent major depressive disorder, without psychotic features (HCC)   2. Difficulty coping      Donia Guiles, LCSW

## 2022-03-02 ENCOUNTER — Telehealth: Payer: Self-pay | Admitting: Plastic Surgery

## 2022-03-02 NOTE — Telephone Encounter (Signed)
LVM and my chart message to please contact office to r/s appt for 11/9 with a new provider due to Dr. Erin Hearing leaving the practice.

## 2022-04-05 ENCOUNTER — Other Ambulatory Visit: Payer: Self-pay

## 2022-04-05 ENCOUNTER — Encounter: Payer: Self-pay | Admitting: Nurse Practitioner

## 2022-04-05 ENCOUNTER — Ambulatory Visit: Payer: BC Managed Care – PPO | Admitting: Nurse Practitioner

## 2022-04-05 VITALS — BP 120/72 | HR 100 | Temp 98.1°F | Resp 18 | Ht 65.5 in | Wt 159.2 lb

## 2022-04-05 DIAGNOSIS — J0140 Acute pansinusitis, unspecified: Secondary | ICD-10-CM | POA: Diagnosis not present

## 2022-04-05 DIAGNOSIS — R051 Acute cough: Secondary | ICD-10-CM | POA: Diagnosis not present

## 2022-04-05 DIAGNOSIS — J069 Acute upper respiratory infection, unspecified: Secondary | ICD-10-CM | POA: Diagnosis not present

## 2022-04-05 MED ORDER — HYDROCOD POLI-CHLORPHE POLI ER 10-8 MG/5ML PO SUER: 5.0000 mL | Freq: Two times a day (BID) | ORAL | 0 refills | Status: DC | PRN

## 2022-04-05 MED ORDER — DOXYCYCLINE HYCLATE 100 MG PO TABS: 100.0000 mg | ORAL_TABLET | Freq: Two times a day (BID) | ORAL | 0 refills | Status: AC

## 2022-04-05 NOTE — Progress Notes (Signed)
BP 120/72   Pulse 100   Temp 98.1 F (36.7 C) (Oral)   Resp 18   Ht 5' 5.5" (1.664 m)   Wt 159 lb 3.2 oz (72.2 kg)   SpO2 98%   BMI 26.09 kg/m    Subjective:    Patient ID: [name], female    DOB: 1970-11-26, 51 y.o.   MRN: [id number]  HPI: [name] is a 51 y.o. female  Chief Complaint  Patient presents with   Cough    Congested, headache, chills, sweats for 1 week   URI/sinus infection:  patient reports she has been having symptoms for about a week.  She reports nasal congestion, cough, headache, eyes feel puffy, chills and sweats.  She says she has not done a covid test and would like to have one.  She denies any shortness of breath or documented fever.  She says she has taken OTC cold medication for her symptoms. Patient reports that tessalon perls do not help.  Will get covid swab. Recommend she start taking zyrtec and flonase.  Will also send in tussinex and doxycycline for sinus infection.   Relevant past medical, surgical, family and social history reviewed and updated as indicated. Interim medical history since our last visit reviewed. Allergies and medications reviewed and updated.  Review of Systems  Constitutional: Negative for fever or weight change.  HEENT: positive for nasal congestion, puffy eyes, facial tenderness Respiratory: positive for cough and negative for shortness of breath.   Cardiovascular: Negative for chest pain or palpitations.  Gastrointestinal: Negative for abdominal pain, no bowel changes.  Musculoskeletal: Negative for gait problem or joint swelling.  Skin: Negative for rash.  Neurological: Negative for dizziness, positive for headache.  No other specific complaints in a complete review of systems (except as listed in HPI above).      Objective:    BP 120/72   Pulse 100   Temp 98.1 F (36.7 C) (Oral)   Resp 18   Ht 5' 5.5" (1.664 m)   Wt 159 lb 3.2 oz (72.2 kg)   SpO2 98%   BMI 26.09 kg/m   Wt Readings from Last 3  Encounters:  04/05/22 159 lb 3.2 oz (72.2 kg)  10/11/21 164 lb (74.4 kg)  08/24/21 157 lb (71.2 kg)    Physical Exam  Constitutional: Patient appears well-developed and well-nourished.  No distress.  HEENT: head atraumatic, normocephalic, pupils equal and reactive to light, ears Tms clear, neck supple, throat within normal limits, facial tenderness Cardiovascular: Normal rate, regular rhythm and normal heart sounds.  No murmur heard. No BLE edema. Pulmonary/Chest: Effort normal and breath sounds normal. No respiratory distress. Abdominal: Soft.  There is no tenderness. Psychiatric: Patient has a normal mood and affect. behavior is normal. Judgment and thought content normal.   Results for orders placed or performed in visit on 10/11/21  CBC with Differential/Platelet  Result Value Ref Range   WBC 4.8 3.8 - 10.8 Thousand/uL   RBC 4.45 3.80 - 5.10 Million/uL   Hemoglobin 12.1 11.7 - 15.5 g/dL   HCT 36.8 35.0 - 45.0 %   MCV 82.7 80.0 - 100.0 fL   MCH 27.2 27.0 - 33.0 pg   MCHC 32.9 32.0 - 36.0 g/dL   RDW 13.7 11.0 - 15.0 %   Platelets 276 140 - 400 Thousand/uL   MPV 11.0 7.5 - 12.5 fL   Neutro Abs 2,918 1,500 - 7,800 cells/uL   Lymphs Abs 1,474 850 - 3,900 cells/uL  Absolute Monocytes 254 200 - 950 cells/uL   Eosinophils Absolute 154 15 - 500 cells/uL   Basophils Absolute 0 0 - 200 cells/uL   Neutrophils Relative % 60.8 %   Total Lymphocyte 30.7 %   Monocytes Relative 5.3 %   Eosinophils Relative 3.2 %   Basophils Relative 0.0 %  COMPLETE METABOLIC PANEL WITH GFR  Result Value Ref Range   Glucose, Bld 85 65 - 99 mg/dL   BUN 11 7 - 25 mg/dL   Creat 0.57 0.50 - 1.03 mg/dL   eGFR 110 > OR = 60 mL/min/1.64m   BUN/Creatinine Ratio NOT APPLICABLE 6 - 22 (calc)   Sodium 143 135 - 146 mmol/L   Potassium 4.6 3.5 - 5.3 mmol/L   Chloride 106 98 - 110 mmol/L   CO2 30 20 - 32 mmol/L   Calcium 9.2 8.6 - 10.4 mg/dL   Total Protein 6.2 6.1 - 8.1 g/dL   Albumin 4.1 3.6 - 5.1 g/dL    Globulin 2.1 1.9 - 3.7 g/dL (calc)   AG Ratio 2.0 1.0 - 2.5 (calc)   Total Bilirubin 0.7 0.2 - 1.2 mg/dL   Alkaline phosphatase (APISO) 99 37 - 153 U/L   AST 25 10 - 35 U/L   ALT 23 6 - 29 U/L  Lipid panel  Result Value Ref Range   Cholesterol 157 <200 mg/dL   HDL 67 > OR = 50 mg/dL   Triglycerides 89 <150 mg/dL   LDL Cholesterol (Calc) 73 mg/dL (calc)   Total CHOL/HDL Ratio 2.3 <5.0 (calc)   Non-HDL Cholesterol (Calc) 90 <130 mg/dL (calc)  Hemoglobin A1c  Result Value Ref Range   Hgb A1c MFr Bld 5.5 <5.7 % of total Hgb   Mean Plasma Glucose 111 mg/dL   eAG (mmol/L) 6.2 mmol/L      Assessment & Plan:   Problem List Items Addressed This Visit   None Visit Diagnoses     Viral upper respiratory tract infection    -  Primary   push fluids, get rest, start zyrtec, and flonse. will send in tussinex and doxycycline, awaiting covid test results   Relevant Medications   chlorpheniramine-HYDROcodone (TUSSIONEX) 10-8 MG/5ML   Other Relevant Orders   Novel Coronavirus, NAA ([facility])   Acute non-recurrent pansinusitis       push fluids, get rest, start zyrtec, and flonse. will send in tussinex and doxycycline   Relevant Medications   chlorpheniramine-HYDROcodone (TUSSIONEX) 10-8 MG/5ML   doxycycline (VIBRA-TABS) 100 MG tablet        Follow up plan: Return if symptoms worsen or fail to improve.

## 2022-04-06 LAB — SPECIMEN STATUS REPORT

## 2022-04-06 LAB — NOVEL CORONAVIRUS, NAA: SARS-CoV-2, NAA: NOT DETECTED

## 2022-04-07 ENCOUNTER — Institutional Professional Consult (permissible substitution): Payer: BC Managed Care – PPO | Admitting: Plastic Surgery

## 2022-04-14 ENCOUNTER — Encounter: Payer: Self-pay | Admitting: Nurse Practitioner

## 2022-04-14 NOTE — Progress Notes (Deleted)
Name: [name]   MRN: [id number]    DOB: Sep 17, 1970   Date:04/14/2022       Progress Note  Subjective  Chief Complaint  No chief complaint on file.   HPI  Patient presents for annual CPE.  Diet: *** Exercise: ***  Sleep: *** Last dental exam:*** Last eye exam: ***  Flowsheet Row Office Visit from 04/05/2022 in [facility]  AUDIT-C Score 0      Depression: Phq 9 is  {Desc; negative/positive:13464}    04/05/2022    1:14 PM 01/12/2022    8:05 AM 10/12/2021   10:20 AM 10/11/2021    8:45 AM 09/30/2021   10:49 AM  Depression screen PHQ 2/9  Decreased Interest 0 0  2   Down, Depressed, Hopeless 0 0  1   PHQ - 2 Score 0 0  3   Altered sleeping  0  3   Tired, decreased energy  0  3   Change in appetite  0  3   Feeling bad or failure about yourself   0  1   Trouble concentrating  0  2   Moving slowly or fidgety/restless  0  1   Suicidal thoughts  0  0   PHQ-9 Score  0  16   Difficult doing work/chores  Not difficult at all  Very difficult      Information is confidential and restricted. Go to Review Flowsheets to unlock data.   Hypertension: BP Readings from Last 3 Encounters:  04/05/22 120/72  01/10/22 (!) 159/97  11/24/21 (!) 160/84   Obesity: Wt Readings from Last 3 Encounters:  04/05/22 159 lb 3.2 oz (72.2 kg)  10/11/21 164 lb (74.4 kg)  08/24/21 157 lb (71.2 kg)   BMI Readings from Last 3 Encounters:  04/05/22 26.09 kg/m  10/11/21 26.88 kg/m  08/24/21 25.73 kg/m     Vaccines:  HPV: up to at age 49 , ask insurance if age between 83-45  Shingrix: 69-64 yo and ask insurance if covered when patient above 50 yo Pneumonia:  educated and discussed with patient. Flu:  educated and discussed with patient.  Hep C Screening: ordered STD testing and prevention (HIV/chl/gon/syphilis): ordered Intimate partner violence:*** Sexual History : Menstrual History/LMP/Abnormal Bleeding:  Incontinence Symptoms:   Breast cancer:  - Last  Mammogram: *** - BRCA gene screening: ***  Osteoporosis: Discussed high calcium and vitamin D supplementation, weight bearing exercises  Cervical cancer screening: ***  Skin cancer: Discussed monitoring for atypical lesions  Colorectal cancer: ***   Lung cancer:   Low Dose CT Chest recommended if Age 9-80 years, 20 pack-year currently smoking OR have quit w/in 15years. Patient does not qualify.   ECG: 03/03/2021  Advanced Care Planning: A voluntary discussion about advance care planning including the explanation and discussion of advance directives.  Discussed health care proxy and Living will, and the patient was able to identify a health care proxy as ***.  Patient {DOES_DOES BSJ:62836} have a living will at present time. If patient does have living will, I have requested they bring this to the clinic to be scanned in to their chart.  Lipids: Lab Results  Component Value Date   CHOL 157 10/11/2021   Lab Results  Component Value Date   HDL 67 10/11/2021   Lab Results  Component Value Date   LDLCALC 73 10/11/2021   Lab Results  Component Value Date   TRIG 89 10/11/2021   Lab Results  Component Value Date  CHOLHDL 2.3 10/11/2021   No results found for: "LDLDIRECT"  Glucose: Glucose, Bld  Date Value Ref Range Status  10/11/2021 85 65 - 99 mg/dL Final    Comment:    .            Fasting reference interval .   03/02/2021 93 70 - 99 mg/dL Final    Comment:    Glucose reference range applies only to samples taken after fasting for at least 8 hours.  11/10/2020 85 70 - 99 mg/dL Final    Comment:    Glucose reference range applies only to samples taken after fasting for at least 8 hours.    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   Major depressive disorder, recurrent episode, severe ([place]) 09/30/2021    Past Surgical History:  Procedure Laterality Date   CHOLECYSTECTOMY      GASTRIC BYPASS      Family History  Problem Relation Age of Onset   Cancer Mother    Heart disease Father    Alcohol abuse Father    Heart disease Brother    Anxiety disorder Brother    Diabetes Paternal Grandmother     Social History   Socioeconomic History   Marital status: Married    Spouse name: Bradly Chris   Number of children: 3   Years of education: Not on file   Highest education level: Some college, no degree  Occupational History   Not on file  Tobacco Use   Smoking status: Never   Smokeless tobacco: Never  Vaping Use   Vaping Use: Never used  Substance and Sexual Activity   Alcohol use: Yes    Comment: occassional/social   Drug use: Yes    Types: Marijuana   Sexual activity: Not on file  Other Topics Concern   Not on file  Social History Narrative   Moved from [place] to [place] then went to [place] and back in [place] for 7 years   Social Determinants of Radio broadcast assistant Strain: Not on file  Food Insecurity: Not on file  Transportation Needs: Not on file  Physical Activity: Not on file  Stress: Not on file  Social Connections: Not on file  Intimate Partner Violence: Not on file     Current Outpatient Medications:    chlorpheniramine-HYDROcodone (TUSSIONEX) 10-8 MG/5ML, Take 5 mLs by mouth every 12 (twelve) hours as needed for cough., Disp: 115 mL, Rfl: 0   doxycycline (VIBRA-TABS) 100 MG tablet, Take 1 tablet (100 mg total) by mouth 2 (two) times daily for 10 days., Disp: 20 tablet, Rfl: 0   omeprazole (PRILOSEC) 40 MG capsule, Take 1 capsule (40 mg total) by mouth daily., Disp: 90 capsule, Rfl: 1   traZODone (DESYREL) 50 MG tablet, Take 0.5-1 tablets (25-50 mg total) by mouth at bedtime as needed for sleep., Disp: 30 tablet, Rfl: 3  No Known Allergies   ROS  Constitutional: Negative for fever or weight change.  Respiratory: Negative for cough and shortness of breath.   Cardiovascular: Negative for chest pain or palpitations.   Gastrointestinal: Negative for abdominal pain, no bowel changes.  Musculoskeletal: Negative for gait problem or joint swelling.  Skin: Negative for rash.  Neurological: Negative for dizziness or headache.  No other specific complaints in a complete review of systems (except as listed in HPI above).   Objective  There were no vitals filed for  this visit.  There is no height or weight on file to calculate BMI.  Physical Exam  Constitutional: Patient appears well-developed and well-nourished. No distress.  HENT: Head: Normocephalic and atraumatic. Ears: B TMs ok, no erythema or effusion; Nose: Nose normal. Mouth/Throat: Oropharynx is clear and moist. No oropharyngeal exudate.  Eyes: Conjunctivae and EOM are normal. Pupils are equal, round, and reactive to light. No scleral icterus.  Neck: Normal range of motion. Neck supple. No JVD present. No thyromegaly present.  Cardiovascular: Normal rate, regular rhythm and normal heart sounds.  No murmur heard. No BLE edema. Pulmonary/Chest: Effort normal and breath sounds normal. No respiratory distress. Abdominal: Soft. Bowel sounds are normal, no distension. There is no tenderness. no masses Breast: no lumps or masses, no nipple discharge or rashes FEMALE GENITALIA:  External genitalia normal External urethra normal Vaginal vault normal without discharge or lesions Cervix normal without discharge or lesions Bimanual exam normal without masses RECTAL: no rectal masses or hemorrhoids Musculoskeletal: Normal range of motion, no joint effusions. No gross deformities Neurological: he is alert and oriented to person, place, and time. No cranial nerve deficit. Coordination, balance, strength, speech and gait are normal.  Skin: Skin is warm and dry. No rash noted. No erythema.  Psychiatric: Patient has a normal mood and affect. behavior is normal. Judgment and thought content normal.      Fall Risk:    04/05/2022    1:13 PM 01/12/2022    8:05  AM 10/11/2021    8:34 AM  Fall Risk   Falls in the past year? 0 1 0  Number falls in past yr: 0 0 0  Injury with Fall? 0 1 0  Risk for fall due to :  Impaired balance/gait;Impaired mobility   Follow up Falls evaluation completed Falls prevention discussed;Education provided;Falls evaluation completed Falls evaluation completed   ***  Functional Status Survey:   ***  Assessment & Plan  There are no diagnoses linked to this encounter.  -USPSTF grade A and B recommendations reviewed with patient; age-appropriate recommendations, preventive care, screening tests, etc discussed and encouraged; healthy living encouraged; see AVS for patient education given to patient -Discussed importance of 150 minutes of physical activity weekly, eat two servings of fish weekly, eat one serving of tree nuts ( cashews, pistachios, pecans, almonds.Marland Kitchen) every other day, eat 6 servings of fruit/vegetables daily and drink plenty of water and avoid sweet beverages.

## 2022-04-27 ENCOUNTER — Ambulatory Visit (INDEPENDENT_AMBULATORY_CARE_PROVIDER_SITE_OTHER): Payer: BC Managed Care – PPO | Admitting: Plastic Surgery

## 2022-04-27 ENCOUNTER — Encounter: Payer: Self-pay | Admitting: Plastic Surgery

## 2022-04-27 VITALS — BP 169/88 | HR 69 | Ht 65.5 in | Wt 161.4 lb

## 2022-04-27 DIAGNOSIS — L989 Disorder of the skin and subcutaneous tissue, unspecified: Secondary | ICD-10-CM

## 2022-04-27 DIAGNOSIS — Z9884 Bariatric surgery status: Secondary | ICD-10-CM

## 2022-04-27 DIAGNOSIS — N906 Unspecified hypertrophy of vulva: Secondary | ICD-10-CM

## 2022-04-27 DIAGNOSIS — R21 Rash and other nonspecific skin eruption: Secondary | ICD-10-CM | POA: Diagnosis not present

## 2022-04-27 DIAGNOSIS — L987 Excessive and redundant skin and subcutaneous tissue: Secondary | ICD-10-CM | POA: Diagnosis not present

## 2022-04-27 NOTE — Progress Notes (Signed)
[name]   Referring Provider Bo Merino, [address] [place],  [place] [id number]   CC:  Chief Complaint  Patient presents with   Advice Only      [name] is an 51 y.o. female.  HPI: [name] is a pleasant 51 year old female who presents today for evaluation of her abdomen and thighs for possible skin removal.  She is also interested in evaluation of her labia for labial reduction.  She also request evaluation of a cyst on the left side of her face. The patient underwent gastric bypass surgery and is lost approximately 140 pounds.  She underwent panniculectomy by her general surgeon.  Is unclear exactly what he did as there is also a scar around her umbilicus and indications that he addressed the upper portion of her abdomen.  She is unhappy with the upper portion of her abdomen as she feels like the skin which remains folds into the middle portion of her abdomen and she gets rashes whenever she wears bike shorts up to the inframammary fold.  She is also unhappy with the skin on her thighs and the size of her labia.  She states that her labia are large enough to cause rashes and that she has difficulty urinating if she cannot sit in a stall big enough to spread her legs widely.  This on the left side of her face has been previously excised and has recurred.  No Known Allergies  Outpatient Encounter Medications as of 04/27/2022  Medication Sig   chlorpheniramine-HYDROcodone (TUSSIONEX) 10-8 MG/5ML Take 5 mLs by mouth every 12 (twelve) hours as needed for cough.   omeprazole (PRILOSEC) 40 MG capsule Take 1 capsule (40 mg total) by mouth daily.   traZODone (DESYREL) 50 MG tablet Take 0.5-1 tablets (25-50 mg total) by mouth at bedtime as needed for sleep.   No facility-administered encounter medications on file as of 04/27/2022.     Past Medical History:  Diagnosis Date   Anxiety    Bowel obstruction (HCC)    Depression    Hypertension     Past  Surgical History:  Procedure Laterality Date   CHOLECYSTECTOMY     GASTRIC BYPASS      Family History  Problem Relation Age of Onset   Cancer Mother    Heart disease Father    Alcohol abuse Father    Heart disease Brother    Anxiety disorder Brother    Diabetes Paternal Grandmother     Social History   Social History Narrative   Moved from [place] to [place] then went to [place] and back in [place] for 7 years     Review of Systems General: Denies fevers, chills, weight loss CV: Denies chest pain, shortness of breath, palpitations Skin: The patient has a 5 mm lesion on the left side of her face.  Abdomen: Patient complains of excess skin on her abdomen. Thighs: Patient complains of excess skin on her medial thighs. External genitalia: The patient complains of excess skin on her labia majora.  Physical Exam    04/27/2022    8:36 AM 04/27/2022    8:32 AM 04/05/2022    1:11 PM  Vitals with BMI  Height  5' 5.5" 5' 5.5"  Weight  161 lbs 6 oz 159 lbs 3 oz  BMI  [id number]  Systolic [id number]  Diastolic 88 [id number] 72  Pulse  69 100    General:  No acute distress,  Alert and  oriented, Non-Toxic, Normal speech and affect Face: The patient has a small 5 mm lesion on the left side of her face.  There is a well-healed scar where she had this excised previously. Abdomen: The patient has a well-healed low transverse incision which extends onto the flanks posteriorly and bilaterally.  She also has a scar around the umbilicus which is well-healed.  She has a small amount of excess skin above the umbilicus.  There is a raphae in the center of the and the excess skin lies on either side of this raphae. Thighs: The patient has excess skin on the medial aspect of the thighs. Labia: The patient has excess skin on the labia majora. Mammogram: No recent mammogram results are available Assessment/Plan Cyst: Patient has a 5 mm cyst on the left side of her face.  We will schedule her for  excision of this under local in the clinic. Abdomen: Patient has excess skin of the anterior abdominal wall.  I can make this a little better with reelevation anterior abdominal wall and removal of a small amount of additional skin.  She may still have the same problem postoperatively with skin folding into the midline.  We discussed this and she understands.  She also asked about removal of the umbilicus to help with upper abdominal skin contouring.  I would be willing to do this if she is interested at the time of surgery.  I also discussed with the patient the fact that she will have a small midline incision below the umbilicus or below where the umbilicus was due to the fact that does not have enough excess skin for me to be able to remove skin all the way above the umbilicus.  She understands this as well. Thighs: I told the patient that I do not do thigh lifts.  I have stopped doing them because of the excessively high rate of complications.  If she is interested in a thigh lift she should seek care from another surgeon. Labia: Prior to any consideration of labioplasty I encouraged the patient to see a dermatologist to discuss her ongoing rashes the rashes may be treated with topical creams.  I have a personally I am reticent to perform any surgery on the labia majora to reduce the size simply due to difficulty urinating. Will submit revision abdominoplasty for the patient.  She understands this is a cosmetic procedure and she will most likely be responsible for the cost of her procedure.  She feels that her insurance company may cover it she and Herbert Seta can discuss this.  Santiago Glad 04/27/2022, 9:18 AM

## 2022-05-04 ENCOUNTER — Other Ambulatory Visit: Payer: Self-pay | Admitting: Nurse Practitioner

## 2022-05-04 DIAGNOSIS — F5105 Insomnia due to other mental disorder: Secondary | ICD-10-CM

## 2022-05-04 NOTE — Telephone Encounter (Signed)
Requested Prescriptions  Pending Prescriptions Disp Refills   traZODone (DESYREL) 50 MG tablet [Pharmacy Med Name: TRAZODONE 50MG  TABLETS] 30 tablet 2    Sig: TAKE 1/2 TO 1 TABLET(25 TO 50 MG) BY MOUTH AT BEDTIME AS NEEDED FOR SLEEP     Psychiatry: Antidepressants - Serotonin Modulator Passed - 05/04/2022  8:08 AM      Passed - Completed PHQ-2 or PHQ-9 in the last 360 days      Passed - Valid encounter within last 6 months    Recent Outpatient Visits           4 weeks ago Viral upper respiratory tract infection   [facility], FNP   3 months ago Acute cough   [facility], FNP   6 months ago Hypertension, unspecified type   [facility] [place], [place], Rudolpho Sevin

## 2022-05-17 ENCOUNTER — Other Ambulatory Visit: Payer: Self-pay

## 2022-05-17 ENCOUNTER — Encounter: Payer: Self-pay | Admitting: Nurse Practitioner

## 2022-05-17 ENCOUNTER — Ambulatory Visit (INDEPENDENT_AMBULATORY_CARE_PROVIDER_SITE_OTHER): Payer: BC Managed Care – PPO | Admitting: Nurse Practitioner

## 2022-05-17 VITALS — BP 122/84 | HR 71 | Temp 97.7°F | Resp 18 | Ht 65.5 in | Wt 163.9 lb

## 2022-05-17 DIAGNOSIS — Z1211 Encounter for screening for malignant neoplasm of colon: Secondary | ICD-10-CM | POA: Diagnosis not present

## 2022-05-17 DIAGNOSIS — Z1212 Encounter for screening for malignant neoplasm of rectum: Secondary | ICD-10-CM

## 2022-05-17 DIAGNOSIS — M25512 Pain in left shoulder: Secondary | ICD-10-CM | POA: Diagnosis not present

## 2022-05-17 DIAGNOSIS — Z1231 Encounter for screening mammogram for malignant neoplasm of breast: Secondary | ICD-10-CM | POA: Diagnosis not present

## 2022-05-17 DIAGNOSIS — Z23 Encounter for immunization: Secondary | ICD-10-CM

## 2022-05-17 DIAGNOSIS — M545 Low back pain, unspecified: Secondary | ICD-10-CM

## 2022-05-17 MED ORDER — TRAMADOL HCL 50 MG PO TABS: 50.0000 mg | ORAL_TABLET | Freq: Three times a day (TID) | ORAL | 0 refills | Status: AC | PRN

## 2022-05-17 MED ORDER — METHOCARBAMOL 500 MG PO TABS: 500.0000 mg | ORAL_TABLET | Freq: Two times a day (BID) | ORAL | 0 refills | Status: DC | PRN

## 2022-05-17 NOTE — Progress Notes (Signed)
Pulse 71   Temp 97.7 F (36.5 C) (Oral)   Resp 18   Ht 5' 5.5" (1.664 m)   Wt 163 lb 14.4 oz (74.3 kg)   SpO2 98%   BMI 26.86 kg/m    Subjective:    Patient ID: [name], female    DOB: [birth year], 51 y.o.   MRN: [id number]  HPI: [name] is a 51 y.o. female  Chief Complaint  Patient presents with   Back Pain    Low back pain for 4 days   Shoulder Pain    Left shoulder for 4 days   Back pain/shoulder pain:low back and left shoulder pain started four days ago. 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  She says that she did not have any other trauma.  She says it started off just being sore but then the pain has increased.  She says that she is having trouble sleeping and can not get comfortable.  She denies any urinary complaints, fever, incontinence, numbness or tingling. She says she has taken tylenol for pain because she has an ulcer and bariatric surgery. Will send in muscle relaxer and a couple days of tramadol for pain. Recommend heat therapy and rest.    Relevant past medical, surgical, family and social history reviewed and updated as indicated. Interim medical history since our last visit reviewed. Allergies and medications reviewed and updated.  Review of Systems  Constitutional: Negative for fever or weight change.  Respiratory: Negative for cough and shortness of breath.   Cardiovascular: Negative for chest pain or palpitations.  Gastrointestinal: Negative for abdominal pain, no bowel changes.  Musculoskeletal: Negative for gait problem or joint swelling. Positive for left shoulder pain, and left sided low back pain Skin: Negative for rash.  Neurological: Negative for dizziness or headache.  No other specific complaints in a complete review of systems (except as listed in HPI above).      Objective:    Pulse 71   Temp 97.7 F (36.5 C) (Oral)   Resp 18   Ht 5' 5.5" (1.664 m)   Wt 163  lb 14.4 oz (74.3 kg)   SpO2 98%   BMI 26.86 kg/m   Wt Readings from Last 3 Encounters:  05/17/22 163 lb 14.4 oz (74.3 kg)  04/27/22 161 lb 6.4 oz (73.2 kg)  04/05/22 159 lb 3.2 oz (72.2 kg)    Physical Exam  Constitutional: Patient appears well-developed and well-nourished.  No distress.  HEENT: head atraumatic, normocephalic, pupils equal and reactive to light, neck supple Cardiovascular: Normal rate, regular rhythm and normal heart sounds.  No murmur heard. No BLE edema. Pulmonary/Chest: Effort normal and breath sounds normal. No respiratory distress. Abdominal: Soft.  There is no tenderness. GBT:[id number] noted on the left trapezoid muscle between the neck and left shoulder.  No cervical tenderness noted Psychiatric: Patient has a normal mood and affect. behavior is normal. Judgment and thought content normal.     Assessment & Plan:   Problem List Items Addressed This Visit   None Visit Diagnoses     Acute left-sided low back pain without sciatica    -  Primary   continue tylenol, may use tramadol for moderate to severe pain,  may use robaxin.  heat thearpy and rest.   Relevant Medications   traMADol (ULTRAM) 50 MG tablet   methocarbamol (ROBAXIN) 500 MG tablet   Acute pain of left  shoulder       continue tylenol, may use tramadol for moderate to severe pain, may use robaxin. heat thearpy and rest.   Relevant Medications   traMADol (ULTRAM) 50 MG tablet   methocarbamol (ROBAXIN) 500 MG tablet   Need for influenza vaccination       Relevant Orders   Flu Vaccine QUAD 6+ mos PF IM (Fluarix Quad PF) (Completed)   Screening mammogram for breast cancer       Relevant Orders   Ambulatory referral to Gastroenterology   Screening for colorectal cancer       Relevant Orders   MM 3D SCREEN BREAST BILATERAL        Follow up plan: Return if symptoms worsen or fail to improve.

## 2022-05-23 IMAGING — CR DG CHEST 2V
1 series · 2 of 2 positions shown · non-contrast
Comparison: None.

CLINICAL DATA: Pt to ER via POV with complaints of shortness of
breath. Reports testing positive for covid via a PCR test on [REDACTED]
[DATE]. Reports [REDACTED] she started finding it difficult to catch her
breath, was having generalized chest pain associated with deep
breaths, and a headache. Started having diarrhea today. Also reports
that her left eye has been hurting since [REDACTED], visibly swollen
and red in triage. No drainage or discharge.

EXAM:
CHEST - 2 VIEW

[Series 1: dg chest 2 view · 0.14mm/px · 2 of 2 slices shown]
[im 1/2]
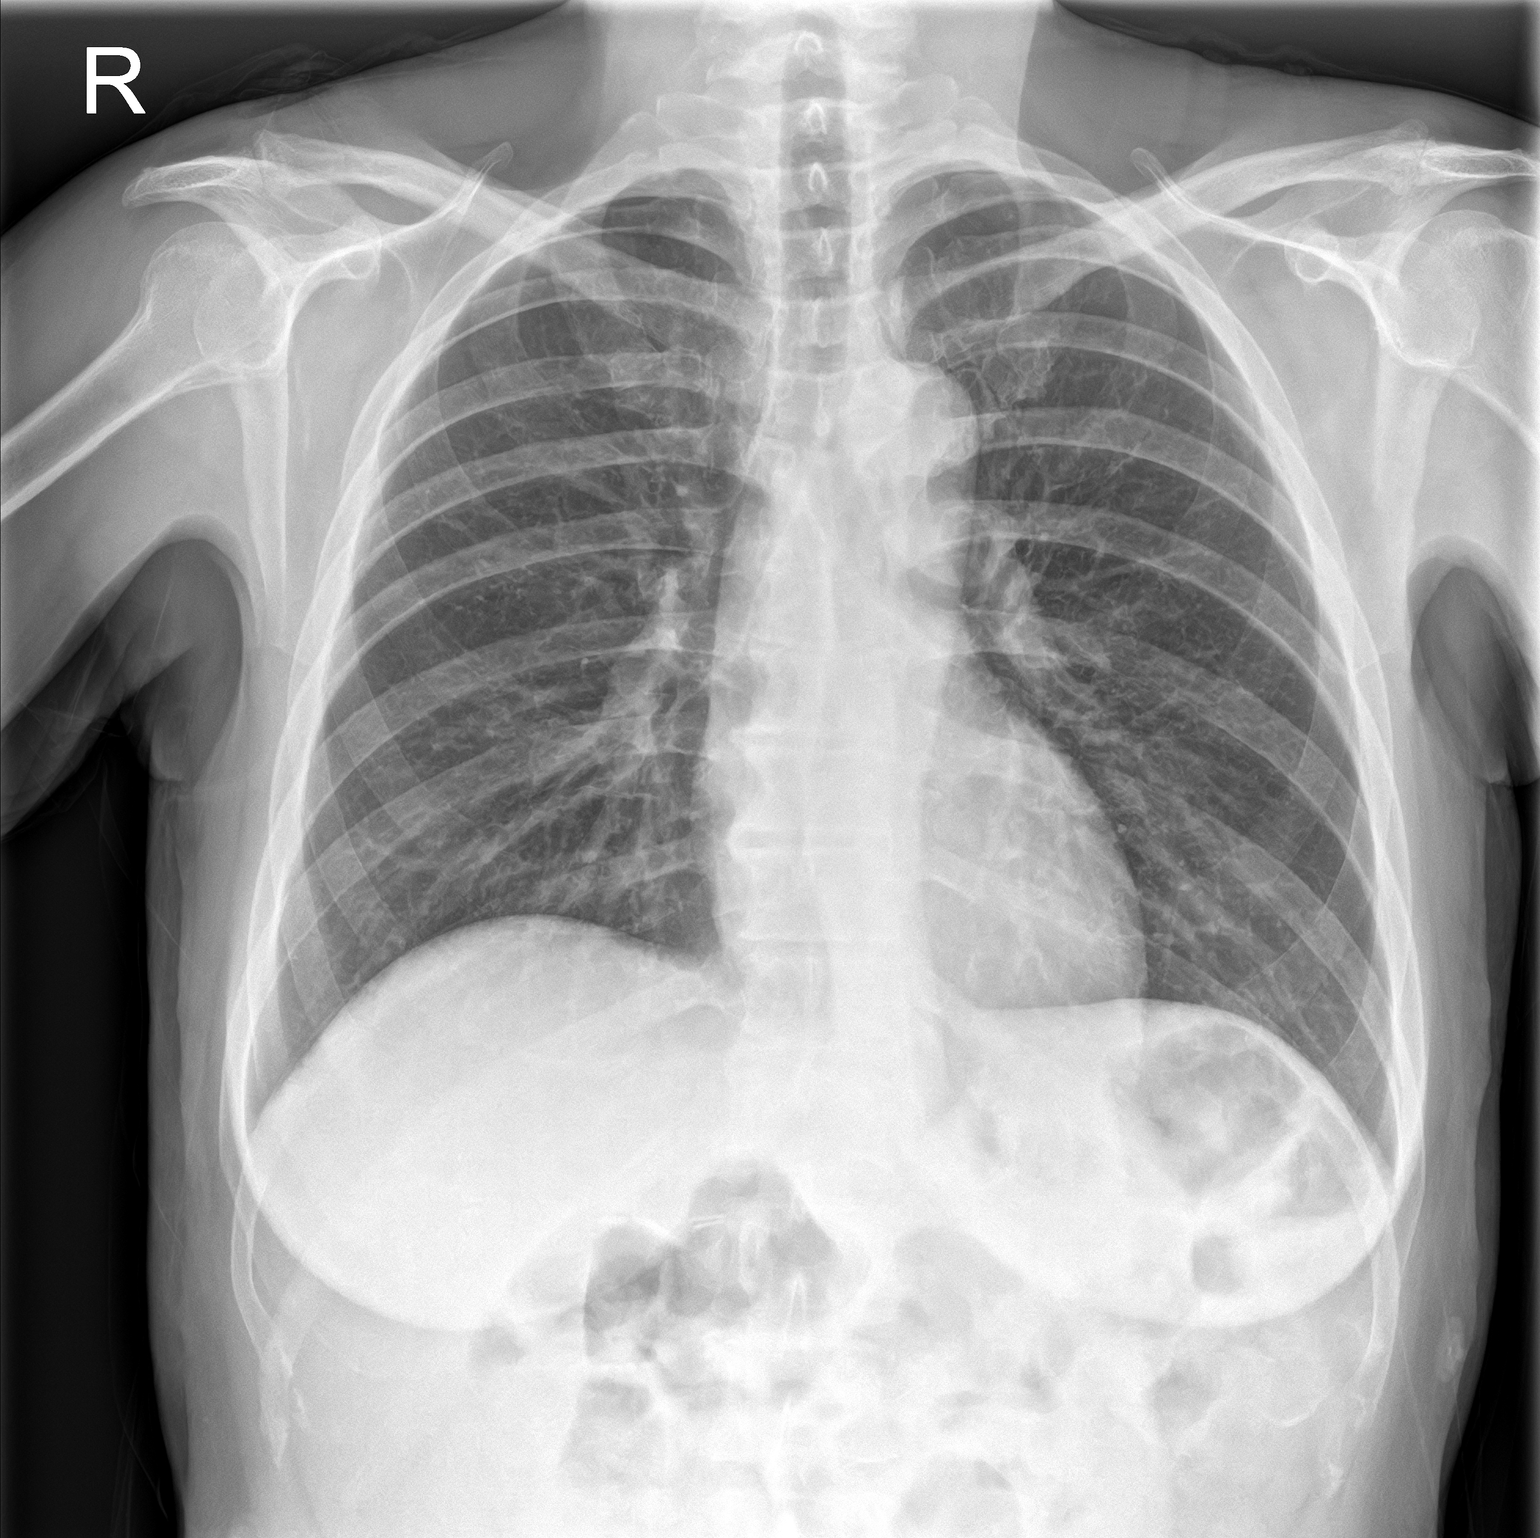
[im 2/2]
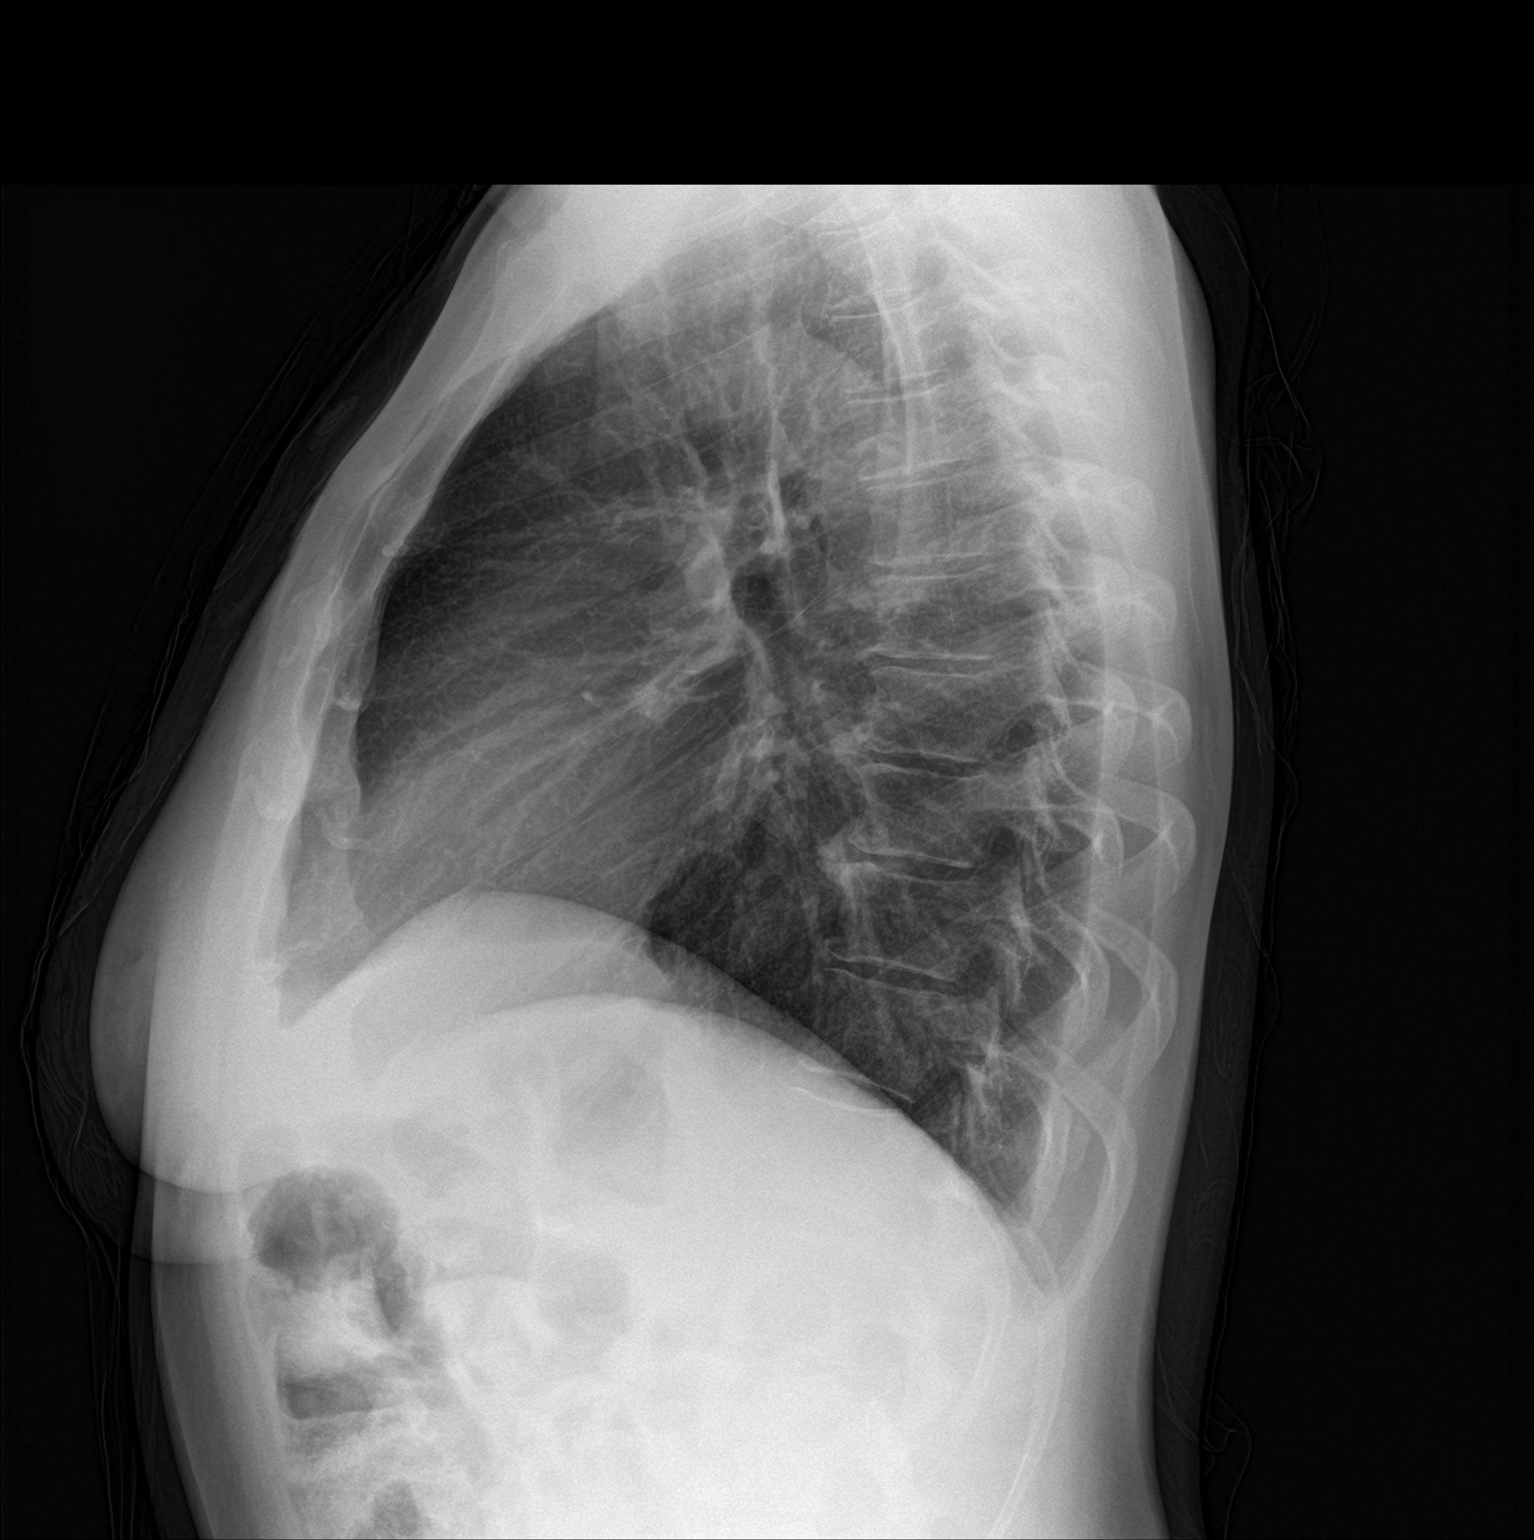

[2 of 2 positions shown; findings below may reference images not displayed]

FINDINGS: Normal heart, mediastinum and hila.

Clear lungs.

No pleural effusion or pneumothorax.

Skeletal structures are intact.
IMPRESSION: No active cardiopulmonary disease.

## 2022-05-25 ENCOUNTER — Ambulatory Visit (INDEPENDENT_AMBULATORY_CARE_PROVIDER_SITE_OTHER): Payer: BC Managed Care – PPO | Admitting: Nurse Practitioner

## 2022-05-25 ENCOUNTER — Encounter: Payer: Self-pay | Admitting: Nurse Practitioner

## 2022-05-25 ENCOUNTER — Other Ambulatory Visit (HOSPITAL_COMMUNITY)
Admission: RE | Admit: 2022-05-25 | Discharge: 2022-05-25 | Disposition: A | Discharge status: Home / Self Care | Payer: BC Managed Care – PPO | Source: Ambulatory Visit | Attending: Nurse Practitioner | Admitting: Nurse Practitioner

## 2022-05-25 ENCOUNTER — Other Ambulatory Visit: Payer: Self-pay

## 2022-05-25 VITALS — BP 116/74 | HR 73 | Temp 98.2°F | Resp 16 | Ht 65.5 in | Wt 163.6 lb

## 2022-05-25 DIAGNOSIS — I10 Essential (primary) hypertension: Secondary | ICD-10-CM

## 2022-05-25 DIAGNOSIS — Z1211 Encounter for screening for malignant neoplasm of colon: Secondary | ICD-10-CM

## 2022-05-25 DIAGNOSIS — Z0000 Encounter for general adult medical examination without abnormal findings: Secondary | ICD-10-CM

## 2022-05-25 DIAGNOSIS — Z124 Encounter for screening for malignant neoplasm of cervix: Secondary | ICD-10-CM | POA: Insufficient documentation

## 2022-05-25 DIAGNOSIS — Z114 Encounter for screening for human immunodeficiency virus [HIV]: Secondary | ICD-10-CM

## 2022-05-25 DIAGNOSIS — Z1159 Encounter for screening for other viral diseases: Secondary | ICD-10-CM

## 2022-05-25 NOTE — Progress Notes (Signed)
Name: [name]   MRN: [id number]    DOB: 1970-11-13   Date:05/25/2022       Progress Note  Subjective  Chief Complaint  Chief Complaint  Patient presents with   Annual Exam    HPI  Patient presents for annual CPE.  Diet: she says she tries to eat well balanced, but is lacking in the vegetables, she is taking a multivitamin Exercise: she has been working on cleaning out the barn, bike riding and hiking  Sleep: 6 hours, weekends 8 hours Last dental exam:last year Last eye exam: two years ago  [place] from 05/25/2022 in [facility]  AUDIT-C Score 0      Depression: Phq 9 is  negative    05/25/2022    1:33 PM 05/17/2022    2:32 PM 04/05/2022    1:14 PM 01/12/2022    8:05 AM 10/12/2021   10:20 AM  Depression screen PHQ 2/9  Decreased Interest 0 0 0 0   Down, Depressed, Hopeless 0 0 0 0   PHQ - 2 Score 0 0 0 0   Altered sleeping    0   Tired, decreased energy    0   Change in appetite    0   Feeling bad or failure about yourself     0   Trouble concentrating    0   Moving slowly or fidgety/restless    0   Suicidal thoughts    0   PHQ-9 Score    0   Difficult doing work/chores    Not difficult at all      Information is confidential and restricted. Go to Review Flowsheets to unlock data.   Hypertension: BP Readings from Last 3 Encounters:  05/25/22 116/74  05/17/22 122/84  04/27/22 (!) 169/88   Obesity: Wt Readings from Last 3 Encounters:  05/25/22 163 lb 9.6 oz (74.2 kg)  05/17/22 163 lb 14.4 oz (74.3 kg)  04/27/22 161 lb 6.4 oz (73.2 kg)   BMI Readings from Last 3 Encounters:  05/25/22 26.81 kg/m  05/17/22 26.86 kg/m  04/27/22 26.45 kg/m     Vaccines:  HPV: up to at age 74 , ask insurance if age between 39-45  Shingrix: 98-64 yo and ask insurance if covered when patient above 30 yo Pneumonia:  educated and discussed with patient. Flu:  educated and discussed with patient.  Hep C Screening: due  ordered STD testing and prevention (HIV/chl/gon/syphilis): due ordered Intimate partner violence:none Sexual History : yes  Menstrual History/LMP/Abnormal Bleeding: partial hysterectomy Incontinence Symptoms: none  Breast cancer:  - Last Mammogram: due, ordered - BRCA gene screening: had done but was negative  Osteoporosis: Discussed high calcium and vitamin D supplementation, weight bearing exercises  Cervical cancer screening: due  Skin cancer: Discussed monitoring for atypical lesions  Colorectal cancer: due   Lung cancer:   Low Dose CT Chest recommended if Age 39-80 years, 20 pack-year currently smoking OR have quit w/in 15years. Patient does not qualify.   ECG: 03/03/2021  Advanced Care Planning: A voluntary discussion about advance care planning including the explanation and discussion of advance directives.  Discussed health care proxy and Living will, and the patient was able to identify a health care proxy as husband.  Patient does not have a living will at present time. If patient does have living will, I have requested they bring this to the clinic to be scanned in to their chart.  Lipids: Lab Results  Component  Value Date   CHOL 157 10/11/2021   Lab Results  Component Value Date   HDL 67 10/11/2021   Lab Results  Component Value Date   LDLCALC 73 10/11/2021   Lab Results  Component Value Date   TRIG 89 10/11/2021   Lab Results  Component Value Date   CHOLHDL 2.3 10/11/2021   No results found for: "LDLDIRECT"  Glucose: Glucose, Bld  Date Value Ref Range Status  10/11/2021 85 65 - 99 mg/dL Final    Comment:    .            Fasting reference interval .   03/02/2021 93 70 - 99 mg/dL Final    Comment:    Glucose reference range applies only to samples taken after fasting for at least 8 hours.  11/10/2020 85 70 - 99 mg/dL Final    Comment:    Glucose reference range applies only to samples taken after fasting for at least 8 hours.    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    Past Surgical History:  Procedure Laterality Date   CHOLECYSTECTOMY     GASTRIC BYPASS      Family History  Problem Relation Age of Onset   Cancer Mother    Heart disease Father    Alcohol abuse Father    Heart disease Brother    Anxiety disorder Brother    Diabetes Paternal Grandmother     Social History   Socioeconomic History   Marital status: Married    Spouse name: Bradly Chris   Number of children: 3   Years of education: Not on file   Highest education level: Some college, no degree  Occupational History   Not on file  Tobacco Use   Smoking status: Never   Smokeless tobacco: Never  Vaping Use   Vaping Use: Never used  Substance and Sexual Activity   Alcohol use: Yes    Comment: occassional/social   Drug use: Yes    Types: Marijuana   Sexual activity: Not on file  Other Topics Concern   Not on file  Social History Narrative   Moved from [place] to [place] then went to [place] and back in [place] for 7 years   Social Determinants of Health   Financial Resource Strain: [place]  (05/25/2022)   Overall Financial Resource Strain (CARDIA)    Difficulty of Paying Living Expenses: Not hard at all  Food Insecurity: No Food Insecurity (05/25/2022)   Hunger Vital Sign    Worried About Running Out of Food in the Last Year: Never true    [place] in the Last Year: Never true  Transportation Needs: No Transportation Needs (05/25/2022)   PRAPARE - Hydrologist (Medical): No    Lack of Transportation (Non-Medical): No  Physical Activity: Sufficiently Active (05/25/2022)   Exercise Vital Sign    Days of Exercise per Week: 4 days    Minutes of Exercise per Session: 60 min  Stress: Stress Concern Present (05/25/2022)   [place]    Feeling of Stress : To  some extent  Social Connections: Moderately Integrated (05/25/2022)   Social Connection and Isolation Panel [NHANES]    Frequency of Communication with Friends and Family: Twice a week    Frequency of Social Gatherings with Friends and Family: Once a  week    Attends Religious Services: 1 to 4 times per year    Active Member of Clubs or Organizations: No    Attends Archivist Meetings: Never    Marital Status: Married  Human resources officer Violence: Not At Risk (05/25/2022)   Humiliation, Afraid, Rape, and Kick questionnaire    Fear of Current or Ex-Partner: No    Emotionally Abused: No    Physically Abused: No    Sexually Abused: No     Current Outpatient Medications:    methocarbamol (ROBAXIN) 500 MG tablet, Take 1 tablet (500 mg total) by mouth 2 (two) times daily as needed for muscle spasms., Disp: 20 tablet, Rfl: 0   omeprazole (PRILOSEC) 40 MG capsule, Take 1 capsule (40 mg total) by mouth daily., Disp: 90 capsule, Rfl: 1   traZODone (DESYREL) 50 MG tablet, TAKE 1/2 TO 1 TABLET(25 TO 50 MG) BY MOUTH AT BEDTIME AS NEEDED FOR SLEEP, Disp: 30 tablet, Rfl: 2  No Known Allergies   ROS  Constitutional: Negative for fever or weight change.  Respiratory: Negative for cough and shortness of breath.   Cardiovascular: Negative for chest pain or palpitations.  Gastrointestinal: Negative for abdominal pain, no bowel changes.  Musculoskeletal: Negative for gait problem or joint swelling.  Skin: Negative for rash.  Neurological: Negative for dizziness or headache.  No other specific complaints in a complete review of systems (except as listed in HPI above).   Objective  Vitals:   05/25/22 1331  BP: 116/74  Pulse: 73  Resp: 16  Temp: 98.2 F (36.8 C)  TempSrc: Oral  SpO2: 99%  Weight: 163 lb 9.6 oz (74.2 kg)  Height: 5' 5.5" (1.664 m)    Body mass index is 26.81 kg/m.  Physical Exam Constitutional: Patient appears well-developed and well-nourished. No distress.   HENT: Head: Normocephalic and atraumatic. Ears: B TMs ok, no erythema or effusion; Nose: Nose normal. Mouth/Throat: Oropharynx is clear and moist. No oropharyngeal exudate.  Eyes: Conjunctivae and EOM are normal. Pupils are equal, round, and reactive to light. No scleral icterus.  Neck: Normal range of motion. Neck supple. No JVD present. No thyromegaly present.  Cardiovascular: Normal rate, regular rhythm and normal heart sounds.  No murmur heard. No BLE edema. Pulmonary/Chest: Effort normal and breath sounds normal. No respiratory distress. Abdominal: Soft. Bowel sounds are normal, no distension. There is no tenderness. no masses Breast: no lumps or masses, no nipple discharge or rashes FEMALE GENITALIA:  External genitalia normal External urethra normal Vaginal vault normal without discharge or lesions Cervix normal without discharge or lesions Bimanual exam normal without masses RECTAL: no rectal masses or hemorrhoids Musculoskeletal: Normal range of motion, no joint effusions. No gross deformities Neurological: he is alert and oriented to person, place, and time. No cranial nerve deficit. Coordination, balance, strength, speech and gait are normal.  Skin: Skin is warm and dry. No rash noted. No erythema.  Psychiatric: Patient has a normal mood and affect. behavior is normal. Judgment and thought content normal.     Fall Risk:    05/25/2022    1:32 PM 05/17/2022    2:31 PM 04/05/2022    1:13 PM 01/12/2022    8:05 AM 10/11/2021    8:34 AM  Fall Risk   Falls in the past year? 0 1 0 1 0  Number falls in past yr: 0 0 0 0 0  Injury with Fall? 0 1 0 1 0  Risk for fall due to :  History  of fall(s)  Impaired balance/gait;Impaired mobility   Follow up Falls evaluation completed Falls evaluation completed Falls evaluation completed Falls prevention discussed;Education provided;Falls evaluation completed Falls evaluation completed     Functional Status Survey: Is the patient deaf or  have difficulty hearing?: No Does the patient have difficulty seeing, even when wearing glasses/contacts?: Yes Does the patient have difficulty concentrating, remembering, or making decisions?: Yes Does the patient have difficulty walking or climbing stairs?: Yes Does the patient have difficulty dressing or bathing?: No Does the patient have difficulty doing errands alone such as visiting a doctor's office or shopping?: No   Assessment & Plan  1. Annual physical exam  - CBC with Differential/Platelet - COMPLETE METABOLIC PANEL WITH GFR - Hepatitis C antibody - HIV Antibody (routine testing w rflx) - Ambulatory referral to Gastroenterology - Cytology - PAP - Iron, TIBC and Ferritin Panel  2. Hypertension, unspecified type  - CBC with Differential/Platelet - COMPLETE METABOLIC PANEL WITH GFR  3. Encounter for hepatitis C screening test for low risk patient  - Hepatitis C antibody  4. Screening for HIV without presence of risk factors  - HIV Antibody (routine testing w rflx)  5. Screening for colon cancer  - Ambulatory referral to Gastroenterology  6. Screening for cervical cancer  - Cytology - PAP   -USPSTF grade A and B recommendations reviewed with patient; age-appropriate recommendations, preventive care, screening tests, etc discussed and encouraged; healthy living encouraged; see AVS for patient education given to patient -Discussed importance of 150 minutes of physical activity weekly, eat two servings of fish weekly, eat one serving of tree nuts ( cashews, pistachios, pecans, almonds.Marland Kitchen) every other day, eat 6 servings of fruit/vegetables daily and drink plenty of water and avoid sweet beverages.   -Reviewed Health Maintenance: due for labs, mammogram, colonoscopy

## 2022-05-26 ENCOUNTER — Telehealth: Payer: Self-pay | Admitting: Nurse Practitioner

## 2022-05-26 ENCOUNTER — Ambulatory Visit (INDEPENDENT_AMBULATORY_CARE_PROVIDER_SITE_OTHER): Payer: BC Managed Care – PPO | Admitting: Plastic Surgery

## 2022-05-26 ENCOUNTER — Encounter: Payer: Self-pay | Admitting: Nurse Practitioner

## 2022-05-26 VITALS — BP 152/86 | HR 62

## 2022-05-26 DIAGNOSIS — L720 Epidermal cyst: Secondary | ICD-10-CM

## 2022-05-26 DIAGNOSIS — L989 Disorder of the skin and subcutaneous tissue, unspecified: Secondary | ICD-10-CM

## 2022-05-26 LAB — COMPLETE METABOLIC PANEL WITH GFR
AG Ratio: 1.8 (calc) (ref 1.0–2.5)
ALT: 20 U/L (ref 6–29)
AST: 22 U/L (ref 10–35)
Albumin: 4.1 g/dL (ref 3.6–5.1)
Alkaline phosphatase (APISO): 108 U/L (ref 37–153)
BUN: 11 mg/dL (ref 7–25)
CO2: 28 mmol/L (ref 20–32)
Calcium: 8.9 mg/dL (ref 8.6–10.4)
Chloride: 107 mmol/L (ref 98–110)
Creat: 0.64 mg/dL (ref 0.50–1.03)
Globulin: 2.3 g/dL (calc) (ref 1.9–3.7)
Glucose, Bld: 77 mg/dL (ref 65–99)
Potassium: 3.9 mmol/L (ref 3.5–5.3)
Sodium: 143 mmol/L (ref 135–146)
Total Bilirubin: 0.5 mg/dL (ref 0.2–1.2)
Total Protein: 6.4 g/dL (ref 6.1–8.1)
eGFR: 106 mL/min/{1.73_m2} (ref >=60)

## 2022-05-26 LAB — IRON,TIBC AND FERRITIN PANEL
%SAT: 7 % (calc) — ABNORMAL LOW (ref 16–45)
Ferritin: 4 ng/mL — ABNORMAL LOW (ref 16–232)
Iron: 31 ug/dL — ABNORMAL LOW (ref 45–160)
TIBC: 431 mcg/dL (calc) (ref 250–450)

## 2022-05-26 LAB — CBC WITH DIFFERENTIAL/PLATELET
Absolute Monocytes: 260 cells/uL (ref 200–950)
Basophils Absolute: 10 cells/uL (ref 0–200)
Basophils Relative: 0.2 %
Eosinophils Absolute: 181 cells/uL (ref 15–500)
Eosinophils Relative: 3.7 %
HCT: 34.2 % — ABNORMAL LOW (ref 35.0–45.0)
Hemoglobin: 11.5 g/dL — ABNORMAL LOW (ref 11.7–15.5)
Lymphs Abs: 1695 cells/uL (ref 850–3900)
MCH: 26.9 pg — ABNORMAL LOW (ref 27.0–33.0)
MCHC: 33.6 g/dL (ref 32.0–36.0)
MCV: 80.1 fL (ref 80.0–100.0)
MPV: 10.7 fL (ref 7.5–12.5)
Monocytes Relative: 5.3 %
Neutro Abs: 2754 cells/uL (ref 1500–7800)
Neutrophils Relative %: 56.2 %
Platelets: 336 10*3/uL (ref 140–400)
RBC: 4.27 10*6/uL (ref 3.80–5.10)
RDW: 15.4 % — ABNORMAL HIGH (ref 11.0–15.0)
Total Lymphocyte: 34.6 %
WBC: 4.9 10*3/uL (ref 3.8–10.8)

## 2022-05-26 LAB — HEPATITIS C ANTIBODY: Hepatitis C Ab: NONREACTIVE

## 2022-05-26 LAB — HIV ANTIBODY (ROUTINE TESTING W REFLEX): HIV 1&2 Ab, 4th Generation: NONREACTIVE

## 2022-05-26 NOTE — Telephone Encounter (Signed)
Pt is calling to check on the status of her lab results. Pt is very concerned wanting to speak to Warren. Please advise CB- [phone]

## 2022-05-26 NOTE — Telephone Encounter (Signed)
Called patient left message on voicemail about her labs

## 2022-05-26 NOTE — Progress Notes (Signed)
Procedure Note  Preoperative Dx: Cyst left side of the face  Postoperative Dx: Same  Procedure: Excision of cyst  Anesthesia: Lidocaine 1% with 1:100,000 epinephrine and 0.25% Sensorcaine   Indication for Procedure: Removal of recurrent cyst for pathologic diagnosis  Description of Procedure: Risks and complications were explained to the patient including recurrence.  Consent was confirmed and the patient understands the risks and benefits.  The potential complications and alternatives were explained and the patient consents.  The patient expressed understanding the option of not having the procedure and the risks of a scar.  Time out was called and all information was confirmed to be correct.    The area was prepped and drapped.  Local anesthetic was injected in the subcutaneous tissues.  After waiting for the local to take affect an incision was made through the previous scar and the pit overlying the cyst was excised.  A combination of sharp and blunt dissection were used to identify the cyst wall.  Because of scarring from the previous surgery removal of the cyst completely intact was not possible so it was removed in pieces.  I believe that I removed the entire cyst wall and the cyst contents.  The specimen was placed in formalin.  After obtaining hemostasis, the surgical wound was closed with 5-0 Prolene sutures.  The surgical wound measured 1 cm.  A dressing was applied.  The patient was given instructions on how to care for the area and a follow up appointment.  [name] tolerated the procedure well and there were no complications. The specimen was sent to pathology.

## 2022-05-27 ENCOUNTER — Other Ambulatory Visit: Payer: Self-pay | Admitting: Nurse Practitioner

## 2022-05-27 DIAGNOSIS — Z860100 Personal history of colon polyps, unspecified: Secondary | ICD-10-CM

## 2022-05-27 DIAGNOSIS — D509 Iron deficiency anemia, unspecified: Secondary | ICD-10-CM

## 2022-05-27 DIAGNOSIS — Z87898 Personal history of other specified conditions: Secondary | ICD-10-CM

## 2022-06-01 LAB — CYTOLOGY - PAP
Chlamydia: NEGATIVE
Comment: NEGATIVE
Comment: NEGATIVE
Comment: NORMAL
Diagnosis: UNDETERMINED — AB
High risk HPV: NEGATIVE
Neisseria Gonorrhea: NEGATIVE

## 2022-06-02 ENCOUNTER — Encounter: Payer: Self-pay | Admitting: Nurse Practitioner

## 2022-06-02 ENCOUNTER — Ambulatory Visit (INDEPENDENT_AMBULATORY_CARE_PROVIDER_SITE_OTHER): Payer: BC Managed Care – PPO | Admitting: Physician Assistant

## 2022-06-02 DIAGNOSIS — L989 Disorder of the skin and subcutaneous tissue, unspecified: Secondary | ICD-10-CM

## 2022-06-02 NOTE — Progress Notes (Signed)
Patient is a 52 year old female with PMH of cystic lesion on left side of face s/p excision performed in office by Dr. Lovena Le 05/26/2022 who returns to clinic for postprocedural follow-up.  Reviewed procedure note and the excision site was closed with 5-0 Prolene sutures.  The lesion was sent to pathology and has not yet resulted.  Today, patient is doing well.  She states that it is mildly sore, but otherwise no complaints.  She has been applying antibiotic ointment and keeping it covered with a bandage.    Physical exam is entirely reassuring.  3 Prolene sutures in place.  Incision well-approximated.  No erythema.  No dehiscence or drainage.  Sutures removed without complication or difficulty.  Discussed silicone scar mitigation treatments.  Recommending thin film of Vaseline for the next week before transitioning to the silicone scar treatments.  At that time, she can also begin gently massaging the excision site.  Follow-up only as needed.  Encouraged patient to call the office if they have any questions about the pathology results.  Picture(s) obtained of the patient and placed in the chart were with the patient's or guardian's permission.

## 2022-06-06 ENCOUNTER — Ambulatory Visit (INDEPENDENT_AMBULATORY_CARE_PROVIDER_SITE_OTHER): Payer: BC Managed Care – PPO | Admitting: Gastroenterology

## 2022-06-06 ENCOUNTER — Other Ambulatory Visit: Payer: Self-pay

## 2022-06-06 ENCOUNTER — Encounter: Payer: Self-pay | Admitting: Gastroenterology

## 2022-06-06 VITALS — BP 179/93 | HR 68 | Temp 98.7°F | Ht 65.5 in | Wt 163.2 lb

## 2022-06-06 DIAGNOSIS — D508 Other iron deficiency anemias: Secondary | ICD-10-CM

## 2022-06-06 MED ORDER — FERROUS SULFATE 325 (65 FE) MG PO TABS: 325.0000 mg | ORAL_TABLET | ORAL | 4 refills | Status: DC

## 2022-06-06 MED ORDER — NA SULFATE-K SULFATE-MG SULF 17.5-3.13-1.6 GM/177ML PO SOLN: 354.0000 mL | Freq: Once | ORAL | 0 refills | Status: AC

## 2022-06-06 NOTE — Progress Notes (Signed)
Anna Mood MD, MRCP(U.K) [address]  [address]  [place], [place] [address]  Main: [phone]  Fax: [phone]   Gastroenterology Consultation  Referring Provider:     Berniece Salines, FNP Primary Care Physician:  Anna Salines, FNP Primary Gastroenterologist:  Dr. Wyline Barr  Reason for Consultation:    Iron deficient anemia        HPI:   [name] is a 52 y.o. y/o female referred for consultation & management  by Dr. Zane Barr, Anna Sevin, FNP.     Patient has been referred for iron deficiency anemia.  Labs in December 2023 demonstrated a ferritin of 4, hemoglobin 11.5 g with an MCV of 80.1.  CMP was normal.  No prior procedures on epic but the patient mentioned that had her last colonoscopy 04/18/2020 precancerous polyps were noted.  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  She is not on an iron supplement.  Denies any hematemesis denies any melena not on any NSAIDs denies any abdominal pain.  No nosebleeds has occasionally seen blood in the urine on 1 occasion may be more.  Has noticed a change in the shape of her stool which is pencil thin recently associated with bloating and gas.  He has had precancerous polyps and a colonoscopy in 2016 as well as in 2021.  Past Medical History:  Diagnosis Date   Anxiety    Bowel obstruction (HCC)    Depression    Hypertension     Past Surgical History:  Procedure Laterality Date   CHOLECYSTECTOMY     GASTRIC BYPASS      Prior to Admission medications   Medication Sig Start Date End Date Taking? Authorizing Provider  methocarbamol (ROBAXIN) 500 MG tablet Take 1 tablet (500 mg total) by mouth 2 (two) times daily as needed for muscle spasms. 05/17/22   Anna Salines, FNP  omeprazole (PRILOSEC) 40 MG capsule Take 1 capsule (40 mg total) by mouth daily. 01/11/22    Anna Salines, FNP  traZODone (DESYREL) 50 MG tablet TAKE 1/2 TO 1 TABLET(25 TO 50 MG) BY MOUTH AT BEDTIME AS NEEDED FOR SLEEP 05/04/22   Anna Salines, FNP    Family History  Problem Relation Age of Onset   Cancer Mother    Heart disease Father    Alcohol abuse Father    Heart disease Brother    Anxiety disorder Brother    Diabetes Paternal Grandmother      Social History   Tobacco Use   Smoking status: Never   Smokeless tobacco: Never  Vaping Use   Vaping Use: Never used  Substance Use Topics   Alcohol use: Yes    Comment: occassional/social   Drug use: Yes    Types: Marijuana    Allergies as of 06/06/2022   (No Known Allergies)    Review of Systems:    All systems reviewed and negative except where noted in HPI.   Physical Exam:  BP (!) 179/93   Pulse 68   Temp 98.7 F (37.1 C) (Oral)   Ht 5' 5.5" (1.664 m)   Wt 163 lb 3.2 oz (74 kg)   BMI 26.74 kg/m  No LMP recorded. Patient has had a hysterectomy. Psych:  Alert and cooperative. Normal Barr and affect. General:   Alert,  Well-developed, well-nourished, pleasant and cooperative  in NAD Head:  Normocephalic and atraumatic. Eyes:  Sclera clear, no icterus.   Conjunctiva pink. Ears:  Normal auditory acuity. Neurologic:  Alert and oriented x3;  grossly normal neurologically. Psych:  Alert and cooperative. Normal Barr and affect.  Imaging Studies: No results found.  Assessment and Plan:   [name] is a 52 y.o. y/o female has been referred for iron deficiency anemia ferritin of 4.  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  B12 folate, H. pylori breath test, celiac serology, urine analysis 2.  Oral iron ferrous sulfate 325 mg every other day if no better at next visit we will give her IV iron 3.  EGD and colonoscopy  4.  I also suspect she may have a degree of constipation advised to increase dietary fiber and commence on  MiraLAX 1 capful full a day   I have discussed alternative options, risks & benefits,  which include, but are not limited to, bleeding, infection, perforation,respiratory complication & drug reaction.  The patient agrees with this plan & written consent will be obtained.     Follow up in 3 to 4 months  Dr Anna Bellows MD,MRCP(U.K)

## 2022-06-06 NOTE — Addendum Note (Signed)
Addended by: Wayna Chalet on: 06/06/2022 02:42 PM   Modules accepted: Orders

## 2022-06-08 ENCOUNTER — Other Ambulatory Visit: Payer: Self-pay

## 2022-06-08 ENCOUNTER — Encounter: Payer: Self-pay | Admitting: Gastroenterology

## 2022-06-08 DIAGNOSIS — D508 Other iron deficiency anemias: Secondary | ICD-10-CM

## 2022-06-08 LAB — CELIAC DISEASE AB SCREEN W/RFX
Antigliadin Abs, IgA: 6 units (ref 0–19)
IgA/Immunoglobulin A, Serum: 84 mg/dL — ABNORMAL LOW (ref 87–352)
Transglutaminase IgA: <2 U/mL (ref 0–3)

## 2022-06-08 LAB — URINALYSIS
Bilirubin, UA: NEGATIVE
Glucose, UA: NEGATIVE
Ketones, UA: NEGATIVE
Leukocytes,UA: NEGATIVE
Nitrite, UA: NEGATIVE
Protein,UA: NEGATIVE
RBC, UA: NEGATIVE
Specific Gravity, UA: 1.014 (ref 1.005–1.030)
Urobilinogen, Ur: 0.2 mg/dL (ref 0.2–1.0)
pH, UA: 6 (ref 5.0–7.5)

## 2022-06-08 LAB — H. PYLORI BREATH TEST: H pylori Breath Test: NEGATIVE

## 2022-06-08 LAB — B12 AND FOLATE PANEL
Folate: 10.5 ng/mL (ref >3.0)
Vitamin B-12: 935 pg/mL (ref 232–1245)

## 2022-06-17 ENCOUNTER — Telehealth: Payer: Self-pay

## 2022-06-17 NOTE — Telephone Encounter (Signed)
The endoscopy unit ([facility]) called to let me know that patient needs to be rescheduled.  Called patient to ask her if we could reschedule her procedure from 06/23/2022 to 06/29/2022 since Dr. Vicente Males is full that day.

## 2022-06-21 NOTE — Telephone Encounter (Signed)
Please read patient message from 06/17/2022.

## 2022-06-23 ENCOUNTER — Ambulatory Visit: Payer: Self-pay | Admitting: *Deleted

## 2022-06-23 NOTE — Progress Notes (Signed)
Virtual Visit via Video Note  I connected with [name] on 06/23/22 at  3:20 PM EST by a video enabled telemedicine application and verified that I am speaking with the correct person using two identifiers.  Location: Patient: Home Provider: [facility]   I discussed the limitations of evaluation and management by telemedicine and the availability of in person appointments. The patient expressed understanding and agreed to proceed.  History of Present Illness:  Patient is presenting via telemedicine for cough and back pain.  Patient was COVID-positive on 06/09/2022.  She states her symptoms started out as lower back ache which then progressed to chills, fatigue and generalized myalgias and arthralgias as well as a headache.  She was taking over-the-counter cold medicine and her symptoms eventually resolved.  Yesterday she then started to feel achy all over again, with lower back pain and fatigue.  She is also having a dry cough, that is worse at night or with laying down.  She tried [employer] in the past which have not helped.  She usually has to take Tussidex for cough, last time she had this prescribed was in November.  She did have a negative home COVID test yesterday.  She denies fevers, shortness of breath, wheezing or chest pain.    Observations/Objective:  General: well appearing, no acute distress ENT: conjunctiva normal appearing bilaterally  Pulm: Occasional dry cough auscultated over telemedicine Skin: no rashes, cyanosis or abnormal bruising noted Neuro: Answers all questions appropriately   Assessment and Plan:  1. Subacute cough/Myalgia: Patient wanting to be retested for COVID, however she is not feeling well and will be difficult for her to come in by the end of the day today.  Also utility of retesting so soon after a positive PCR COVID test will not be helpful as she could still be showing positive from her original infection.  She would be out of the timeframe for  antiviral therapy.  Will continue to treat symptoms as needed, I will refill her cough medication to help her sleep at night.  She can continue over-the-counter decongestants, nasal steroids and ibuprofen/Tylenol for her back pain.  I also recommend moist heat and topical medication such as Bengay and IcyHot for back pain as well.  Patient currently being worked up for iron deficiency anemia, planning for colonoscopy next week.  Is currently on iron supplements but should be seen in the office in the next month or so to recheck labs.  May require IV iron therapy.  - chlorpheniramine-HYDROcodone (TUSSIONEX) 10-8 MG/5ML; Take 5 mLs by mouth every 12 (twelve) hours as needed for up to 7 days for cough. Do not exceed 10 mL in a 24 hour period.  Dispense: 70 mL; Refill: 0  Follow Up Instructions: 3 months to recheck hemoglobin and iron    I discussed the assessment and treatment plan with the patient. The patient was provided an opportunity to ask questions and all were answered. The patient agreed with the plan and demonstrated an understanding of the instructions.   The patient was advised to call back or seek an in-person evaluation if the symptoms worsen or if the condition fails to improve as anticipated.  I provided 20 minutes of non-face-to-face time during this encounter.   Teodora Medici, DO

## 2022-06-23 NOTE — Telephone Encounter (Signed)
  Chief Complaint: Severe lower back pain in the middle and her cough has returned.   Tested positive for Covid on Jan. 11, 2024.  She had these same symptoms right before she got sick with Covid.   Symptoms: Dry cough, Severe middle lower back pain.  Tired.   Was very [place] and is that way again before she had Covid. Frequency: Now Pertinent Negatives: Patient denies Fever or urinary symptoms.   Disposition: [] ED /[] Urgent Care (no appt availability in office) / [x] Appointment(In office/virtual)/ []  [facility]/ [] Home Care/ [] Refused Recommended Disposition /[] [facility]/ []  Follow-up with PCP Additional Notes: Virtual visit made at her request with Dr. Rosana Berger for 06/24/2022 at 3:20.

## 2022-06-23 NOTE — Telephone Encounter (Signed)
Reason for Disposition  [1] SEVERE back pain (e.g., excruciating, unable to do any normal activities) AND [2] not improved 2 hours after pain medicine  Answer Assessment - Initial Assessment Questions 1. ONSET: "When did the pain begin?"      Having severe lower back pain.   I had it before Covid started.   It's in the lower middle back.    It went away when I got really sick with Covid.     I feel tired and awful.    My cough is back.   I had left over cough medicine I used during Covid.    2. LOCATION: "Where does it hurt?" (upper, mid or lower back)     I don't feel like I have fever.    I'm so [place].   My lower middle back.     Jan. 11 was when I was positive for Covid.   3. SEVERITY: "How bad is the pain?"  (e.g., Scale 1-10; mild, moderate, or severe)   - MILD (1-3): Doesn't interfere with normal activities.    - MODERATE (4-7): Interferes with normal activities or awakens from sleep.    - SEVERE (8-10): Excruciating pain, unable to do any normal activities.      No urinary symptoms. 4. PATTERN: "Is the pain constant?" (e.g., yes, no; constant, intermittent)      Constant  5. RADIATION: "Does the pain shoot into your legs or somewhere else?"     No 6. CAUSE:  "What do you think is causing the back pain?"      I don't know .    I think it's Covid again.   This is how it started when I was sick with Covid. 7. BACK OVERUSE:  "Any recent lifting of heavy objects, strenuous work or exercise?"     No 8. MEDICINES: "What have you taken so far for the pain?" (e.g., nothing, acetaminophen, NSAIDS)     I took OTC cold and Flu 9. NEUROLOGIC SYMPTOMS: "Do you have any weakness, numbness, or problems with bowel/bladder control?"     No 10. OTHER SYMPTOMS: "Do you have any other symptoms?" (e.g., fever, abdomen pain, burning with urination, blood in urine)       Coughing has returned.   It had gone away.    Dry cough. 11. PREGNANCY: "Is there any chance you are pregnant?" "When was your last  menstrual period?"       N/A  Protocols used: Back Pain-A-AH

## 2022-06-24 ENCOUNTER — Other Ambulatory Visit: Payer: Self-pay | Admitting: Gastroenterology

## 2022-06-24 ENCOUNTER — Telehealth (INDEPENDENT_AMBULATORY_CARE_PROVIDER_SITE_OTHER): Payer: BC Managed Care – PPO | Admitting: Internal Medicine

## 2022-06-24 ENCOUNTER — Telehealth: Payer: Self-pay | Admitting: Nurse Practitioner

## 2022-06-24 DIAGNOSIS — M545 Low back pain, unspecified: Secondary | ICD-10-CM

## 2022-06-24 DIAGNOSIS — R052 Subacute cough: Secondary | ICD-10-CM

## 2022-06-24 DIAGNOSIS — M791 Myalgia, unspecified site: Secondary | ICD-10-CM

## 2022-06-24 MED ORDER — HYDROCOD POLI-CHLORPHE POLI ER 10-8 MG/5ML PO SUER: 5.0000 mL | Freq: Two times a day (BID) | ORAL | 0 refills | Status: DC | PRN

## 2022-06-24 NOTE — Telephone Encounter (Signed)
Patient called in states [facility] is out of tussinex

## 2022-06-27 ENCOUNTER — Other Ambulatory Visit: Payer: Self-pay | Admitting: Internal Medicine

## 2022-06-27 DIAGNOSIS — R052 Subacute cough: Secondary | ICD-10-CM

## 2022-06-27 MED ORDER — HYDROCOD POLI-CHLORPHE POLI ER 10-8 MG/5ML PO SUER: 5.0000 mL | Freq: Two times a day (BID) | ORAL | 0 refills | Status: AC | PRN

## 2022-06-27 NOTE — Telephone Encounter (Signed)
Pt called reporting that she wants her prescription for Tussionex sent to [facility] on [address] and [address]

## 2022-06-27 NOTE — Telephone Encounter (Signed)
Roselyn Reef taking care of this seen Dr.Andrews

## 2022-07-07 ENCOUNTER — Encounter
Admission: RE | Disposition: A | Discharge status: Home / Self Care | Payer: Self-pay | Source: Ambulatory Visit | Attending: Gastroenterology

## 2022-07-07 ENCOUNTER — Ambulatory Visit: Payer: BC Managed Care – PPO | Admitting: Anesthesiology

## 2022-07-07 ENCOUNTER — Ambulatory Visit
Admission: RE | Admit: 2022-07-07 | Discharge: 2022-07-07 | Disposition: A | Discharge status: Home / Self Care | Payer: BC Managed Care – PPO | Source: Ambulatory Visit | Attending: Gastroenterology | Admitting: Gastroenterology

## 2022-07-07 ENCOUNTER — Encounter: Payer: Self-pay | Admitting: Gastroenterology

## 2022-07-07 DIAGNOSIS — I10 Essential (primary) hypertension: Secondary | ICD-10-CM | POA: Insufficient documentation

## 2022-07-07 DIAGNOSIS — Z9884 Bariatric surgery status: Secondary | ICD-10-CM | POA: Diagnosis not present

## 2022-07-07 DIAGNOSIS — K317 Polyp of stomach and duodenum: Secondary | ICD-10-CM | POA: Insufficient documentation

## 2022-07-07 DIAGNOSIS — Z9049 Acquired absence of other specified parts of digestive tract: Secondary | ICD-10-CM | POA: Diagnosis not present

## 2022-07-07 DIAGNOSIS — D649 Anemia, unspecified: Secondary | ICD-10-CM

## 2022-07-07 DIAGNOSIS — F418 Other specified anxiety disorders: Secondary | ICD-10-CM | POA: Diagnosis not present

## 2022-07-07 DIAGNOSIS — F32A Depression, unspecified: Secondary | ICD-10-CM | POA: Diagnosis not present

## 2022-07-07 DIAGNOSIS — D508 Other iron deficiency anemias: Secondary | ICD-10-CM

## 2022-07-07 DIAGNOSIS — F419 Anxiety disorder, unspecified: Secondary | ICD-10-CM | POA: Insufficient documentation

## 2022-07-07 DIAGNOSIS — D509 Iron deficiency anemia, unspecified: Secondary | ICD-10-CM | POA: Diagnosis not present

## 2022-07-07 DIAGNOSIS — D131 Benign neoplasm of stomach: Secondary | ICD-10-CM | POA: Diagnosis not present

## 2022-07-07 HISTORY — PX: ESOPHAGOGASTRODUODENOSCOPY: SHX5428

## 2022-07-07 HISTORY — PX: COLONOSCOPY WITH PROPOFOL: SHX5780

## 2022-07-07 SURGERY — COLONOSCOPY WITH PROPOFOL
Anesthesia: General

## 2022-07-07 MED ORDER — PROPOFOL 10 MG/ML IV BOLUS
INTRAVENOUS | Status: DC | PRN
  Administered 2022-07-07: 80 mg via INTRAVENOUS

## 2022-07-07 MED ORDER — LIDOCAINE HCL (CARDIAC) PF 100 MG/5ML IV SOSY
PREFILLED_SYRINGE | INTRAVENOUS | Status: DC | PRN
  Administered 2022-07-07: 4 mg via INTRAVENOUS

## 2022-07-07 MED ORDER — SODIUM CHLORIDE 0.9 % IV SOLN
INTRAVENOUS | Status: DC
  Administered 2022-07-07: 1000 mL via INTRAVENOUS

## 2022-07-07 MED ORDER — PROPOFOL 500 MG/50ML IV EMUL
INTRAVENOUS | Status: DC | PRN
  Administered 2022-07-07: 200 ug/kg/min via INTRAVENOUS

## 2022-07-07 MED ORDER — DEXMEDETOMIDINE HCL IN NACL 200 MCG/50ML IV SOLN
INTRAVENOUS | Status: DC | PRN
  Administered 2022-07-07: 4 ug via INTRAVENOUS

## 2022-07-07 NOTE — H&P (Signed)
Jonathon Bellows, MD [address], [place], [place], [place], [address] [address], [place], [place], [place], [address] Phone: [phone]  Fax: [phone]  Primary Care Physician:  Bo Merino, FNP   Pre-Procedure History & Physical: HPI:  [name] is a 52 y.o. female is here for an endoscopy and colonoscopy    Past Medical History:  Diagnosis Date   Anxiety    Bowel obstruction ([place])    Depression    Hypertension     Past Surgical History:  Procedure Laterality Date   ABDOMINAL HYSTERECTOMY     CHOLECYSTECTOMY     GASTRIC BYPASS      Prior to Admission medications   Medication Sig Start Date End Date Taking? Authorizing Provider  chlorpheniramine-HYDROcodone (TUSSIONEX) 10-8 MG/5ML Take 5 mLs by mouth 2 (two) times daily.   Yes [provider]  ferrous sulfate 325 (65 FE) MG tablet TAKE 1 TABLET BY MOUTH EVERY OTHER DAY 06/27/22  Yes Jonathon Bellows, MD  omeprazole (PRILOSEC) 40 MG capsule Take 1 capsule (40 mg total) by mouth daily. 01/11/22  Yes Bo Merino, FNP  traZODone (DESYREL) 50 MG tablet TAKE 1/2 TO 1 TABLET(25 TO 50 MG) BY MOUTH AT BEDTIME AS NEEDED FOR SLEEP 05/04/22  Yes Bo Merino, FNP    Allergies as of 06/07/2022   (No Known Allergies)    Family History  Problem Relation Age of Onset   Cancer Mother    Heart disease Father    Alcohol abuse Father    Heart disease Brother    Anxiety disorder Brother    Diabetes Paternal Grandmother     Social History   Socioeconomic History   Marital status: Married    Spouse name: [name]   Number of children: 3   Years of education: Not on file   Highest education level: Some college, no degree  Occupational History   Not on file  Tobacco Use   Smoking status: Never   Smokeless tobacco: Never  Vaping Use   Vaping Use: Never used  Substance and Sexual Activity   Alcohol use: Yes    Comment: occassional/social   Drug use: Yes    Types: Marijuana   Sexual activity: Not  on file  Other Topics Concern   Not on file  Social History Narrative   Moved from [place] to [place] then went to [place] and back in [place] for 7 years   Social Determinants of Health   Financial Resource Strain: [place]  (05/25/2022)   Overall Financial Resource Strain (CARDIA)    Difficulty of Paying Living Expenses: Not hard at all  Food Insecurity: No Food Insecurity (05/25/2022)   Hunger Vital Sign    Worried About Running Out of Food in the Last Year: Never true    [place] in the Last Year: Never true  Transportation Needs: No Transportation Needs (05/25/2022)   PRAPARE - Hydrologist (Medical): No    Lack of Transportation (Non-Medical): No  Physical Activity: Sufficiently Active (05/25/2022)   Exercise Vital Sign    Days of Exercise per Week: 4 days    Minutes of Exercise per Session: 60 min  Stress: Stress Concern Present (05/25/2022)   [place]    Feeling of Stress : To some extent  Social Connections: Moderately Integrated (05/25/2022)   Social Connection and Isolation Panel [NHANES]    Frequency of  Communication with Friends and Family: Twice a week    Frequency of Social Gatherings with Friends and Family: Once a week    Attends Religious Services: 1 to 4 times per year    Active Member of [employer] or Organizations: No    Attends Archivist Meetings: Never    Marital Status: Married  Human resources officer Violence: Not At Risk (05/25/2022)   Humiliation, Afraid, Rape, and Kick questionnaire    Fear of Current or Ex-Partner: No    Emotionally Abused: No    Physically Abused: No    Sexually Abused: No    Review of Systems: See HPI, otherwise negative ROS  Physical Exam: BP (!) 143/94   Pulse 72   Temp (!) 96.9 F (36.1 C) (Temporal)   Resp 18   Ht 5\' 6"  (1.676 m)   Wt 70 kg   SpO2 100%   BMI 24.92 kg/m  General:   Alert,  pleasant and  cooperative in NAD Head:  Normocephalic and atraumatic. Neck:  Supple; no masses or thyromegaly. Lungs:  Clear throughout to auscultation, normal respiratory effort.    Heart:  +S1, +S2, Regular rate and rhythm, No edema. Abdomen:  Soft, nontender and nondistended. Normal bowel sounds, without guarding, and without rebound.   Neurologic:  Alert and  oriented x4;  grossly normal neurologically.  Impression/Plan: [name] is here for an endoscopy and colonoscopy  to be performed for  evaluation of iron deficiency anemia    Risks, benefits, limitations, and alternatives regarding endoscopy have been reviewed with the patient.  Questions have been answered.  All parties agreeable.   Jonathon Bellows, MD  07/07/2022, 9:48 AM

## 2022-07-07 NOTE — Transfer of Care (Signed)
Immediate Anesthesia Transfer of Care Note  Patient: Nalanie Winiecki  Procedure(s) Performed: Procedure(s): COLONOSCOPY WITH PROPOFOL (N/A) ESOPHAGOGASTRODUODENOSCOPY (EGD) (N/A)  Patient Location: PACU and Endoscopy Unit  Anesthesia Type:General  Level of Consciousness: sedated  Airway & Oxygen Therapy: Patient Spontanous Breathing and Patient connected to nasal cannula oxygen  Post-op Assessment: Report given to RN and Post -op Vital signs reviewed and stable  Post vital signs: Reviewed and stable  Last Vitals:  Vitals:   07/07/22 0927 07/07/22 1035  BP: (!) 143/94 121/77  Pulse: 72 71  Resp: 18 18  Temp: (!) 36.1 C (!) 36.1 C  SpO2: 350% 093%    Complications: No apparent anesthesia complications

## 2022-07-07 NOTE — Op Note (Signed)
[facility] Patient Name: [name] Procedure Date: 07/07/2022 9:59 AM MRN: [id number] Account #: [id number] Date of Birth: [birth year] Admit Type: Outpatient Age: 52 Room: [facility] ENDO ROOM 4 Gender: Female Note Status: Finalized Instrument Name: Altamese Cabal Endoscope [id number] Procedure:             Upper GI endoscopy Indications:           Iron deficiency anemia Providers:             Jonathon Bellows MD, MD Referring MD:          Myna Hidalgo. Reece Packer (Referring MD) Medicines:             Monitored Anesthesia Care Complications:         No immediate complications. Procedure:             Pre-Anesthesia Assessment:                        - Prior to the procedure, a History and Physical was                         performed, and patient medications, allergies and                         sensitivities were reviewed. The patient's tolerance                         of previous anesthesia was reviewed.                        - The risks and benefits of the procedure and the                         sedation options and risks were discussed with the                         patient. All questions were answered and informed                         consent was obtained.                        - ASA Grade Assessment: II - A patient with mild                         systemic disease.                        After obtaining informed consent, the endoscope was                         passed under direct vision. Throughout the procedure,                         the patient's blood pressure, pulse, and oxygen                         saturations were monitored continuously. The Endoscope                         was  introduced through the mouth, and advanced to the                         jejunum. The upper GI endoscopy was accomplished with                         ease. The patient tolerated the procedure well. Findings:      The esophagus was normal.      The examined jejunum  was normal. Biopsies for histology were taken with       a cold forceps for evaluation of celiac disease.      Evidence of a Roux-en-Y gastrojejunostomy was found. The gastrojejunal       anastomosis was characterized by healthy appearing mucosa. This was       traversed. The pouch-to-jejunum limb was characterized by healthy       appearing mucosa.      A single 15 mm semi-pedunculated polyp with no bleeding and no stigmata       of recent bleeding was found in the cardia. The polyp was removed with a       hot snare. Resection and retrieval were complete. To prevent bleeding       after the polypectomy, one hemostatic clip was successfully placed.       There was no bleeding at the end of the procedure.      The cardia and gastric fundus were normal on retroflexion. Impression:            - Normal esophagus.                        - Normal examined jejunum. Biopsied.                        - Roux-en-Y gastrojejunostomy with gastrojejunal                         anastomosis characterized by healthy appearing mucosa.                        - A single gastric polyp. Resected and retrieved. Clip                         was placed. Recommendation:        - Await pathology results.                        - Perform a colonoscopy today. Procedure Code(s):     --- Professional ---                        [phone], Esophagogastroduodenoscopy, flexible,                         transoral; with removal of tumor(s), polyp(s), or                         other lesion(s) by snare technique                        43239, 59, Esophagogastroduodenoscopy, flexible,  transoral; with biopsy, single or multiple Diagnosis Code(s):     --- Professional ---                        Z98.0, Intestinal bypass and anastomosis status                        K31.7, Polyp of stomach and duodenum                        D50.9, Iron deficiency anemia, unspecified CPT copyright 2022 American Medical  Association. All rights reserved. The codes documented in this report are preliminary and upon coder review may  be revised to meet current compliance requirements. Jonathon Bellows, MD Jonathon Bellows MD, MD 07/07/2022 10:17:31 AM This report has been signed electronically. Number of Addenda: 0 Note Initiated On: 07/07/2022 9:59 AM Estimated Blood Loss:  Estimated blood loss: none.      [facility]

## 2022-07-07 NOTE — Anesthesia Preprocedure Evaluation (Signed)
Anesthesia Evaluation  Patient identified by MRN, date of birth, ID band Patient awake    Reviewed: Allergy & Precautions, NPO status , Patient's Chart, lab work & pertinent test results  Airway Mallampati: II  TM Distance: >3 FB Neck ROM: full    Dental  (+) Chipped, Partial Upper, Missing, Caps   Pulmonary neg pulmonary ROS   Pulmonary exam normal        Cardiovascular hypertension, Normal cardiovascular exam     Neuro/Psych  PSYCHIATRIC DISORDERS Anxiety Depression    negative neurological ROS     GI/Hepatic negative GI ROS, Neg liver ROS,,,  Endo/Other  negative endocrine ROS    Renal/GU negative Renal ROS  negative genitourinary   Musculoskeletal   Abdominal   Peds  Hematology negative hematology ROS (+)   Anesthesia Other Findings Past Medical History: No date: Anxiety No date: Bowel obstruction ([facility]) No date: Depression No date: Hypertension  Past Surgical History: No date: ABDOMINAL HYSTERECTOMY No date: CHOLECYSTECTOMY No date: GASTRIC BYPASS  BMI    Body Mass Index: 24.92 kg/m      Reproductive/Obstetrics negative OB ROS                             Anesthesia Physical Anesthesia Plan  ASA: 2  Anesthesia Plan: General   Post-op Pain Management:    Induction: Intravenous  PONV Risk Score and Plan: Propofol infusion and TIVA  Airway Management Planned: Natural Airway and Nasal Cannula  Additional Equipment:   Intra-op Plan:   Post-operative Plan:   Informed Consent: I have reviewed the patients History and Physical, chart, labs and discussed the procedure including the risks, benefits and alternatives for the proposed anesthesia with the patient or authorized representative who has indicated his/her understanding and acceptance.     Dental Advisory Given  Plan Discussed with: Anesthesiologist, CRNA and Surgeon  Anesthesia Plan Comments: (Patient  consented for risks of anesthesia including but not limited to:  - adverse reactions to medications - risk of airway placement if required - damage to eyes, teeth, lips or other oral mucosa - nerve damage due to positioning  - sore throat or hoarseness - Damage to heart, brain, nerves, lungs, other parts of body or loss of life  Patient voiced understanding.)        Anesthesia Quick Evaluation

## 2022-07-07 NOTE — Anesthesia Postprocedure Evaluation (Signed)
Anesthesia Post Note  Patient: [name]  Procedure(s) Performed: COLONOSCOPY WITH PROPOFOL ESOPHAGOGASTRODUODENOSCOPY (EGD)  Patient location during evaluation: Endoscopy Anesthesia Type: General Level of consciousness: awake and alert Pain management: pain level controlled Vital Signs Assessment: post-procedure vital signs reviewed and stable Respiratory status: spontaneous breathing, nonlabored ventilation and respiratory function stable Cardiovascular status: blood pressure returned to baseline and stable Postop Assessment: no apparent nausea or vomiting Anesthetic complications: no   No notable events documented.   Last Vitals:  Vitals:   07/07/22 1035 07/07/22 1055  BP: 121/77   Pulse: 71 61  Resp: 18   Temp: (!) 36.1 C   SpO2: 100% 100%    Last Pain:  Vitals:   07/07/22 1055  TempSrc:   PainSc: 0-No pain                 Alphonsus Sias

## 2022-07-07 NOTE — Op Note (Signed)
[facility] Patient Name: [name] Procedure Date: 07/07/2022 9:58 AM MRN: [id number] Account #: [id number] Date of Birth: 03-08-1971 Admit Type: Outpatient Age: 52 Room: [facility] ENDO ROOM 4 Gender: Female Note Status: Finalized Instrument Name: Jasper Riling [id number] Procedure:             Colonoscopy Indications:           Iron deficiency anemia Providers:             Jonathon Bellows MD, MD Referring MD:          Myna Hidalgo. Reece Packer (Referring MD) Medicines:             Monitored Anesthesia Care Complications:         No immediate complications. Procedure:             Pre-Anesthesia Assessment:                        - Prior to the procedure, a History and Physical was                         performed, and patient medications, allergies and                         sensitivities were reviewed. The patient's tolerance                         of previous anesthesia was reviewed.                        - The risks and benefits of the procedure and the                         sedation options and risks were discussed with the                         patient. All questions were answered and informed                         consent was obtained.                        - ASA Grade Assessment: II - A patient with mild                         systemic disease.                        After obtaining informed consent, the colonoscope was                         passed under direct vision. Throughout the procedure,                         the patient's blood pressure, pulse, and oxygen                         saturations were monitored continuously. The                         Colonoscope was introduced through the  anus and                         advanced to the the cecum, identified by the                         appendiceal orifice. The colonoscopy was performed                         with ease. The patient tolerated the procedure well.                          The quality of the bowel preparation was excellent.                         The ileocecal valve, appendiceal orifice, and rectum                         were photographed. Findings:      The perianal and digital rectal examinations were normal.      The entire examined colon appeared normal on direct and retroflexion       views. Impression:            - The entire examined colon is normal on direct and                         retroflexion views.                        - No specimens collected. Recommendation:        - Discharge patient to home (with escort).                        - Resume previous diet.                        - Continue present medications.                        - Repeat colonoscopy in 10 years for screening                         purposes.                        - Return to GI office as previously scheduled. Procedure Code(s):     --- Professional ---                        [phone], Colonoscopy, flexible; diagnostic, including                         collection of specimen(s) by brushing or washing, when                         performed (separate procedure) Diagnosis Code(s):     --- Professional ---                        D50.9, Iron deficiency anemia, unspecified CPT copyright 2022 American Medical Association. All rights reserved. The codes documented in this  report are preliminary and upon coder review may  be revised to meet current compliance requirements. Jonathon Bellows, MD Jonathon Bellows MD, MD 07/07/2022 10:32:47 AM This report has been signed electronically. Number of Addenda: 0 Note Initiated On: 07/07/2022 9:58 AM Scope Withdrawal Time: 0 hours 8 minutes 3 seconds  Total Procedure Duration: 0 hours 12 minutes 57 seconds  Estimated Blood Loss:  Estimated blood loss: none.      [facility]

## 2022-07-07 NOTE — Anesthesia Procedure Notes (Signed)
Date/Time: 07/07/2022 10:01 AM  Performed by: Doreen Salvage, CRNAPre-anesthesia Checklist: Patient identified, Emergency Drugs available, Suction available and Patient being monitored Patient Re-evaluated:Patient Re-evaluated prior to induction Oxygen Delivery Method: Nasal cannula Induction Type: IV induction Dental Injury: Teeth and Oropharynx as per pre-operative assessment  Comments: Nasal cannula with etCO2 monitoring

## 2022-07-08 ENCOUNTER — Encounter: Payer: Self-pay | Admitting: Gastroenterology

## 2022-07-11 ENCOUNTER — Encounter: Payer: Self-pay | Admitting: Gastroenterology

## 2022-07-11 LAB — SURGICAL PATHOLOGY

## 2022-07-25 ENCOUNTER — Encounter: Payer: Self-pay | Admitting: Gastroenterology

## 2022-07-25 ENCOUNTER — Encounter: Payer: Self-pay | Admitting: Nurse Practitioner

## 2022-07-26 ENCOUNTER — Other Ambulatory Visit: Payer: Self-pay

## 2022-07-26 ENCOUNTER — Encounter: Payer: Self-pay | Admitting: Nurse Practitioner

## 2022-07-26 ENCOUNTER — Ambulatory Visit (INDEPENDENT_AMBULATORY_CARE_PROVIDER_SITE_OTHER): Payer: BC Managed Care – PPO | Admitting: Nurse Practitioner

## 2022-07-26 VITALS — BP 120/80 | HR 78 | Temp 97.7°F | Resp 16 | Ht 65.5 in | Wt 162.1 lb

## 2022-07-26 DIAGNOSIS — F419 Anxiety disorder, unspecified: Secondary | ICD-10-CM | POA: Diagnosis not present

## 2022-07-26 DIAGNOSIS — F332 Major depressive disorder, recurrent severe without psychotic features: Secondary | ICD-10-CM | POA: Diagnosis not present

## 2022-07-26 DIAGNOSIS — K219 Gastro-esophageal reflux disease without esophagitis: Secondary | ICD-10-CM

## 2022-07-26 MED ORDER — OMEPRAZOLE 40 MG PO CPDR: 40.0000 mg | DELAYED_RELEASE_CAPSULE | Freq: Every day | ORAL | 3 refills | Status: DC

## 2022-07-26 MED ORDER — ESCITALOPRAM OXALATE 10 MG PO TABS: 10.0000 mg | ORAL_TABLET | Freq: Every day | ORAL | 0 refills | Status: DC

## 2022-07-26 MED ORDER — BUSPIRONE HCL 5 MG PO TABS: 5.0000 mg | ORAL_TABLET | Freq: Three times a day (TID) | ORAL | 0 refills | Status: DC

## 2022-07-26 NOTE — Assessment & Plan Note (Signed)
Start lexapro 10 mg daily and buspar 5 mg three times a day as needed. Also recommend [facility].  Referral placed and information provided for patient to call.

## 2022-07-26 NOTE — Progress Notes (Signed)
BP 120/80   Pulse 78   Temp 97.7 F (36.5 C) (Oral)   Resp 16   Ht 5' 5.5" (1.664 m)   Wt 162 lb 1.6 oz (73.5 kg)   SpO2 99%   BMI 26.56 kg/m    Subjective:    Patient ID: [name], female    DOB: Jun 18, 1970, 52 y.o.   MRN: [id number]  HPI: [name] is a 52 y.o. female  Chief Complaint  Patient presents with   Depression   Anxiety   Depression/anxiety:patient reports she has noticed recently that her anxiety and depression is getting worse. She has coming up to the anniversary of the death of [name].  She is struggling right now and can not do an inpatient program due to work. She says she has previously been on medications in the past. Her PHQ9 and GAD scores are positive.  She has no plan to hurt herself but acknowledges that she needs help. Will start lexapro 10 mg daily and buspar 5 mg three times a day as needed.  [facility]. Referral and information provided.     07/26/2022    2:43 PM 06/24/2022    2:07 PM 05/25/2022    1:33 PM 05/17/2022    2:32 PM 04/05/2022    1:14 PM  Depression screen PHQ 2/9  Decreased Interest 2 0 0 0 0  Down, Depressed, Hopeless 2 0 0 0 0  PHQ - 2 Score 4 0 0 0 0  Altered sleeping 3 0     Tired, decreased energy 2 0     Change in appetite 3 0     Feeling bad or failure about yourself  1 0     Trouble concentrating 2 0     Moving slowly or fidgety/restless 1 0     Suicidal thoughts 1 0     PHQ-9 Score 17 0     Difficult doing work/chores Very difficult           07/26/2022    2:46 PM 10/11/2021    8:46 AM  GAD 7 : Generalized Anxiety Score  Nervous, Anxious, on Edge 2 1  Control/stop worrying 1 0  Worry too much - different things 2 1  Trouble relaxing 2 2  Restless 1 1  Easily annoyed or irritable 3 3  Afraid - awful might happen 0 0  Total GAD 7 Score 11 8  Anxiety Difficulty Somewhat difficult Somewhat difficult     Relevant past medical, surgical, family  and social history reviewed and updated as indicated. Interim medical history since our last visit reviewed. Allergies and medications reviewed and updated.  Review of Systems  Constitutional: Negative for fever or weight change.  Respiratory: Negative for cough and shortness of breath.   Cardiovascular: Negative for chest pain or palpitations.  Gastrointestinal: Negative for abdominal pain, no bowel changes.  Musculoskeletal: Negative for gait problem or joint swelling.  Skin: Negative for rash.  Neurological: Negative for dizziness or headache.  No other specific complaints in a complete review of systems (except as listed in HPI above).      Objective:    BP 120/80   Pulse 78   Temp 97.7 F (36.5 C) (Oral)   Resp 16   Ht 5' 5.5" (1.664 m)   Wt 162 lb 1.6 oz (73.5 kg)   SpO2 99%   BMI 26.56 kg/m   Wt Readings from Last 3 Encounters:  07/26/22  162 lb 1.6 oz (73.5 kg)  07/07/22 154 lb 6.4 oz (70 kg)  06/06/22 163 lb 3.2 oz (74 kg)    Physical Exam  Constitutional: Patient appears well-developed and well-nourished.  No distress.  HEENT: head atraumatic, normocephalic, pupils equal and reactive to light, neck supple Cardiovascular: Normal rate, regular rhythm and normal heart sounds.  No murmur heard. No BLE edema. Pulmonary/Chest: Effort normal and breath sounds normal. No respiratory distress. Abdominal: Soft.  There is no tenderness. Psychiatric: Patient has a normal mood and affect. behavior is normal. Judgment and thought content normal.      Assessment & Plan:   Problem List Items Addressed This Visit       Other   Severe episode of recurrent major depressive disorder, without psychotic features ([place]) - Primary    Start lexapro 10 mg daily and buspar 5 mg three times a day as needed. [facility].  Referral placed and information provided for patient to call.       Relevant Medications   escitalopram (LEXAPRO) 10 MG tablet    busPIRone (BUSPAR) 5 MG tablet   Other Relevant Orders   Ambulatory referral to Psychiatry   Anxiety    Start lexapro 10 mg daily and buspar 5 mg three times a day as needed. Also [facility].  Referral placed and information provided for patient to call.       Relevant Medications   escitalopram (LEXAPRO) 10 MG tablet   busPIRone (BUSPAR) 5 MG tablet   Other Relevant Orders   Ambulatory referral to Psychiatry     Follow up plan: Return in about 4 weeks (around 08/23/2022) for follow up.

## 2022-07-26 NOTE — Telephone Encounter (Signed)
1. No ulcers seen on recent EGD 2. Can give refill for prilosec

## 2022-08-04 ENCOUNTER — Other Ambulatory Visit: Payer: Self-pay | Admitting: Nurse Practitioner

## 2022-08-04 DIAGNOSIS — F419 Anxiety disorder, unspecified: Secondary | ICD-10-CM

## 2022-08-04 DIAGNOSIS — F603 Borderline personality disorder: Secondary | ICD-10-CM | POA: Diagnosis not present

## 2022-08-04 DIAGNOSIS — Z5181 Encounter for therapeutic drug level monitoring: Secondary | ICD-10-CM | POA: Diagnosis not present

## 2022-08-04 DIAGNOSIS — F332 Major depressive disorder, recurrent severe without psychotic features: Secondary | ICD-10-CM

## 2022-08-04 DIAGNOSIS — F411 Generalized anxiety disorder: Secondary | ICD-10-CM | POA: Diagnosis not present

## 2022-08-04 DIAGNOSIS — F1320 Sedative, hypnotic or anxiolytic dependence, uncomplicated: Secondary | ICD-10-CM | POA: Diagnosis not present

## 2022-08-05 ENCOUNTER — Other Ambulatory Visit: Payer: Self-pay

## 2022-08-05 DIAGNOSIS — F419 Anxiety disorder, unspecified: Secondary | ICD-10-CM

## 2022-08-05 DIAGNOSIS — F332 Major depressive disorder, recurrent severe without psychotic features: Secondary | ICD-10-CM

## 2022-08-05 NOTE — Telephone Encounter (Signed)
Requested medication (s) are due for refill today: No  Requested medication (s) are on the active medication list: Yes  Last refill:  07/26/22  Future visit scheduled: Yes  Notes to clinic:  See pharmacy request.    Requested Prescriptions  Pending Prescriptions Disp Refills   busPIRone (BUSPAR) 5 MG tablet [Pharmacy Med Name: BUSPIRONE HCL 5 MG TABLET] 270 tablet 1    Sig: TAKE 1 TABLET BY MOUTH THREE TIMES A DAY     Psychiatry: Anxiolytics/Hypnotics - Non-controlled Passed - 08/04/2022  8:37 PM      Passed - Valid encounter within last 12 months    Recent Outpatient Visits           1 week ago Severe episode of recurrent major depressive disorder, without psychotic features [facility])   [facility] Bo Merino, FNP   1 month ago Subacute cough   [facility] Teodora Medici, DO   2 months ago Annual physical exam   [facility] Serafina Royals F, FNP   2 months ago Acute left-sided low back pain without sciatica   [facility] Bo Merino, FNP   4 months ago Viral upper respiratory tract infection   [place], Julie F, FNP       Future Appointments             In 3 weeks Reece Packer, Myna Hidalgo, [facility], [place]   In 1 month Jonathon Bellows, [facility] at [facility]             escitalopram (LEXAPRO) 10 MG tablet [Pharmacy Med Name: ESCITALOPRAM 10 MG TABLET] 90 tablet 1    Sig: TAKE 1 [place]     Psychiatry:  Antidepressants - SSRI Passed - 08/04/2022  8:37 PM      Passed - Completed PHQ-2 or PHQ-9 in the last 360 days      Passed - Valid encounter within last 6 months    Recent Outpatient Visits           1 week ago Severe episode of recurrent major depressive disorder, without psychotic features [facility])   [facility] Bo Merino, FNP   1 month ago Subacute cough   [facility] Teodora Medici, DO   2 months ago Annual physical exam   [facility] Serafina Royals F, FNP   2 months ago Acute left-sided low back pain without sciatica   [facility] Bo Merino, FNP   4 months ago Viral upper respiratory tract infection   [place], Julie F, FNP       Future Appointments             In 3 weeks Reece Packer, Myna Hidalgo, [facility], [place]   In 1 month Jonathon Bellows, MD [place] [facility] at [facility]

## 2022-08-23 DIAGNOSIS — S93602A Unspecified sprain of left foot, initial encounter: Secondary | ICD-10-CM | POA: Diagnosis not present

## 2022-08-23 DIAGNOSIS — S86911A Strain of unspecified muscle(s) and tendon(s) at lower leg level, right leg, initial encounter: Secondary | ICD-10-CM | POA: Diagnosis not present

## 2022-08-23 DIAGNOSIS — S99922A Unspecified injury of left foot, initial encounter: Secondary | ICD-10-CM | POA: Diagnosis not present

## 2022-08-23 DIAGNOSIS — M7989 Other specified soft tissue disorders: Secondary | ICD-10-CM | POA: Diagnosis not present

## 2022-08-26 ENCOUNTER — Ambulatory Visit: Payer: BC Managed Care – PPO | Admitting: Nurse Practitioner

## 2022-08-26 MED ORDER — ESCITALOPRAM OXALATE 10 MG PO TABS: 10.0000 mg | ORAL_TABLET | Freq: Every day | ORAL | 0 refills | Status: DC

## 2022-08-26 MED ORDER — BUSPIRONE HCL 5 MG PO TABS: 5.0000 mg | ORAL_TABLET | Freq: Three times a day (TID) | ORAL | 0 refills | Status: DC

## 2022-08-26 NOTE — Progress Notes (Deleted)
There were no vitals taken for this visit.   Subjective:    Patient ID: [name], female    DOB: Feb 17, 1971, 52 y.o.   MRN: [id number]  HPI: [name] is a 52 y.o. female  No chief complaint on file.  Depression/anxiety:patient reported at last visit that  she had noticed recently that her anxiety and depression had been getting worse. She has the anniversary of the death of [name].  She is struggling right now and can not do an inpatient program due to work. She says she has previously been on medications in the past. Her PHQ9 and GAD scores were positive.  She had no plan to hurt herself but acknowledges that she needs help.  Started patient on lexapro 10 mg daily and buspar 5 mg three time a day as needed.  Also discussed counseling.  Patient reports ***    07/26/2022    2:43 PM 06/24/2022    2:07 PM 05/25/2022    1:33 PM 05/17/2022    2:32 PM 04/05/2022    1:14 PM  Depression screen PHQ 2/9  Decreased Interest 2 0 0 0 0  Down, Depressed, Hopeless 2 0 0 0 0  PHQ - 2 Score 4 0 0 0 0  Altered sleeping 3 0     Tired, decreased energy 2 0     Change in appetite 3 0     Feeling bad or failure about yourself  1 0     Trouble concentrating 2 0     Moving slowly or fidgety/restless 1 0     Suicidal thoughts 1 0     PHQ-9 Score 17 0     Difficult doing work/chores Very difficult           07/26/2022    2:46 PM 10/11/2021    8:46 AM  GAD 7 : Generalized Anxiety Score  Nervous, Anxious, on Edge 2 1  Control/stop worrying 1 0  Worry too much - different things 2 1  Trouble relaxing 2 2  Restless 1 1  Easily annoyed or irritable 3 3  Afraid - awful might happen 0 0  Total GAD 7 Score 11 8  Anxiety Difficulty Somewhat difficult Somewhat difficult     Relevant past medical, surgical, family and social history reviewed and updated as indicated. Interim medical history since our last visit reviewed. Allergies and medications reviewed and updated.  Review of  Systems  Constitutional: Negative for fever or weight change.  Respiratory: Negative for cough and shortness of breath.   Cardiovascular: Negative for chest pain or palpitations.  Gastrointestinal: Negative for abdominal pain, no bowel changes.  Musculoskeletal: Negative for gait problem or joint swelling.  Skin: Negative for rash.  Neurological: Negative for dizziness or headache.  No other specific complaints in a complete review of systems (except as listed in HPI above).      Objective:    There were no vitals taken for this visit.  Wt Readings from Last 3 Encounters:  07/26/22 162 lb 1.6 oz (73.5 kg)  07/07/22 154 lb 6.4 oz (70 kg)  06/06/22 163 lb 3.2 oz (74 kg)    Physical Exam  Constitutional: Patient appears well-developed and well-nourished.  No distress.  HEENT: head atraumatic, normocephalic, pupils equal and reactive to light, neck supple Cardiovascular: Normal rate, regular rhythm and normal heart sounds.  No murmur heard. No BLE edema. Pulmonary/Chest: Effort normal and breath sounds normal. No respiratory distress. Abdominal: Soft.  There is no tenderness.  Psychiatric: Patient has a normal mood and affect. behavior is normal. Judgment and thought content normal.      Assessment & Plan:   Problem List Items Addressed This Visit   None    Follow up plan: No follow-ups on file.

## 2022-09-05 ENCOUNTER — Encounter: Payer: Self-pay | Admitting: Nurse Practitioner

## 2022-09-05 ENCOUNTER — Other Ambulatory Visit: Payer: Self-pay

## 2022-09-05 ENCOUNTER — Ambulatory Visit: Payer: BC Managed Care – PPO | Admitting: Nurse Practitioner

## 2022-09-05 VITALS — BP 118/72 | HR 68 | Temp 98.0°F | Resp 16 | Ht 65.5 in | Wt 162.3 lb

## 2022-09-05 DIAGNOSIS — F332 Major depressive disorder, recurrent severe without psychotic features: Secondary | ICD-10-CM

## 2022-09-05 DIAGNOSIS — F419 Anxiety disorder, unspecified: Secondary | ICD-10-CM

## 2022-09-05 NOTE — Assessment & Plan Note (Signed)
Start buspar 5 mg three times a day as needed.

## 2022-09-05 NOTE — Progress Notes (Signed)
BP 118/72   Pulse 68   Temp 98 F (36.7 C) (Oral)   Resp 16   Ht 5' 5.5" (1.664 m)   Wt 162 lb 4.8 oz (73.6 kg)   SpO2 99%   BMI 26.60 kg/m    Subjective:    Patient ID: [name], female    DOB: 06-09-1970, 52 y.o.   MRN: [id number]  HPI: [name] is a 52 y.o. female  Chief Complaint  Patient presents with   Depression   Depression/anxiety:patient reported at last visit that  she had noticed recently that her anxiety and depression had been getting worse. She has the anniversary of the death of [name].  She is struggling right now and can not do an inpatient program due to work. She says she has previously been on medications in the past. Her PHQ9 and GAD scores were positive.  She had no plan to hurt herself but acknowledges that she needs help.  Started patient on lexapro 10 mg daily and buspar 5 mg three time a day as needed.  Also discussed counseling. She did go to [facility]  She was started on hydroxyzine.  She did not like it. She never started taking the lexapro and buspar.  She is going to try the buspar 5 mg three times a day and see if she likes that.  She does not want to take something every day. She wants something to take that she can just take when she is irritated.      09/05/2022   11:01 AM 07/26/2022    2:43 PM 06/24/2022    2:07 PM 05/25/2022    1:33 PM 05/17/2022    2:32 PM  Depression screen PHQ 2/9  Decreased Interest 0 2 0 0 0  Down, Depressed, Hopeless 0 2 0 0 0  PHQ - 2 Score 0 4 0 0 0  Altered sleeping 1 3 0    Tired, decreased energy 1 2 0    Change in appetite 1 3 0    Feeling bad or failure about yourself  0 1 0    Trouble concentrating 1 2 0    Moving slowly or fidgety/restless 0 1 0    Suicidal thoughts 0 1 0    PHQ-9 Score 4 17 0    Difficult doing work/chores Not difficult at all Very difficult          09/05/2022   11:02 AM 07/26/2022    2:46 PM 10/11/2021    8:46 AM  GAD 7 : Generalized Anxiety Score  Nervous,  Anxious, on Edge 0 2 1  Control/stop worrying 0 1 0  Worry too much - different things 0 2 1  Trouble relaxing 0 2 2  Restless 0 1 1  Easily annoyed or irritable 1 3 3   Afraid - awful might happen 0 0 0  Total GAD 7 Score 1 11 8   Anxiety Difficulty Not difficult at all Somewhat difficult Somewhat difficult     Relevant past medical, surgical, family and social history reviewed and updated as indicated. Interim medical history since our last visit reviewed. Allergies and medications reviewed and updated.  Review of Systems  Constitutional: Negative for fever or weight change.  Respiratory: Negative for cough and shortness of breath.   Cardiovascular: Negative for chest pain or palpitations.  Gastrointestinal: Negative for abdominal pain, no bowel changes.  Musculoskeletal: Negative for gait problem or joint swelling.  Skin: Negative for rash.  Neurological: Negative for dizziness  or headache.  No other specific complaints in a complete review of systems (except as listed in HPI above).      Objective:    BP 118/72   Pulse 68   Temp 98 F (36.7 C) (Oral)   Resp 16   Ht 5' 5.5" (1.664 m)   Wt 162 lb 4.8 oz (73.6 kg)   SpO2 99%   BMI 26.60 kg/m   Wt Readings from Last 3 Encounters:  09/05/22 162 lb 4.8 oz (73.6 kg)  07/26/22 162 lb 1.6 oz (73.5 kg)  07/07/22 154 lb 6.4 oz (70 kg)    Physical Exam  Constitutional: Patient appears well-developed and well-nourished.  No distress.  HEENT: head atraumatic, normocephalic, pupils equal and reactive to light, neck supple Cardiovascular: Normal rate, regular rhythm and normal heart sounds.  No murmur heard. No BLE edema. Pulmonary/Chest: Effort normal and breath sounds normal. No respiratory distress. Abdominal: Soft.  There is no tenderness. Psychiatric: Patient has a normal mood and affect. behavior is normal. Judgment and thought content normal.      Assessment & Plan:   Problem List Items Addressed This Visit        Other   Severe episode of recurrent major depressive disorder, without psychotic features    Start buspar 5 mg three times a day as needed.       Anxiety - Primary    Start buspar 5 mg three times a day as needed.         Follow up plan: Return in about 6 months (around 03/07/2023) for follow up.

## 2022-09-05 NOTE — Assessment & Plan Note (Signed)
Start buspar 5 mg three times a day as needed.  

## 2022-09-12 ENCOUNTER — Other Ambulatory Visit: Payer: Self-pay

## 2022-09-15 ENCOUNTER — Ambulatory Visit (INDEPENDENT_AMBULATORY_CARE_PROVIDER_SITE_OTHER): Payer: Self-pay | Admitting: Gastroenterology

## 2022-09-15 ENCOUNTER — Encounter: Payer: Self-pay | Admitting: Gastroenterology

## 2022-09-15 DIAGNOSIS — D508 Other iron deficiency anemias: Secondary | ICD-10-CM

## 2022-09-15 DIAGNOSIS — Z91199 Patient's noncompliance with other medical treatment and regimen due to unspecified reason: Secondary | ICD-10-CM

## 2022-09-15 NOTE — Progress Notes (Signed)
No show

## 2022-09-16 DIAGNOSIS — M1711 Unilateral primary osteoarthritis, right knee: Secondary | ICD-10-CM | POA: Diagnosis not present

## 2022-09-19 NOTE — Telephone Encounter (Signed)
Patient called also at 10:10am. Patient is having sever abdominal pain that started this morning when she woke. The pain is a aching pain. The patient has been vomiting after eating. States this has been going on several months once a week. Please call patient she wants to know what she needs to do

## 2022-09-19 NOTE — Telephone Encounter (Signed)
Yes , if pain is severe , with colonoscopy we do not go into the small intestine with regards to her quesiton

## 2022-09-20 ENCOUNTER — Emergency Department: Payer: BC Managed Care – PPO

## 2022-09-20 ENCOUNTER — Emergency Department
Admission: EM | Admit: 2022-09-20 | Discharge: 2022-09-20 | Disposition: A | Discharge status: Home / Self Care | Payer: BC Managed Care – PPO | Attending: Emergency Medicine | Admitting: Emergency Medicine

## 2022-09-20 DIAGNOSIS — K5900 Constipation, unspecified: Secondary | ICD-10-CM | POA: Diagnosis not present

## 2022-09-20 DIAGNOSIS — I10 Essential (primary) hypertension: Secondary | ICD-10-CM | POA: Diagnosis not present

## 2022-09-20 DIAGNOSIS — R109 Unspecified abdominal pain: Secondary | ICD-10-CM | POA: Insufficient documentation

## 2022-09-20 LAB — URINALYSIS, ROUTINE W REFLEX MICROSCOPIC
Bacteria, UA: NONE SEEN
Bilirubin Urine: NEGATIVE
Glucose, UA: NEGATIVE mg/dL
Hgb urine dipstick: NEGATIVE
Ketones, ur: 5 mg/dL — AB
Nitrite: NEGATIVE
Protein, ur: NEGATIVE mg/dL
Specific Gravity, Urine: 1.024 (ref 1.005–1.030)
pH: 5 (ref 5.0–8.0)

## 2022-09-20 LAB — COMPREHENSIVE METABOLIC PANEL
ALT: 20 U/L (ref 0–44)
AST: 26 U/L (ref 15–41)
Albumin: 4.1 g/dL (ref 3.5–5.0)
Alkaline Phosphatase: 128 U/L — ABNORMAL HIGH (ref 38–126)
Anion gap: 10 (ref 5–15)
BUN: 12 mg/dL (ref 6–20)
CO2: 24 mmol/L (ref 22–32)
Calcium: 9 mg/dL (ref 8.9–10.3)
Chloride: 106 mmol/L (ref 98–111)
Creatinine, Ser: 0.61 mg/dL (ref 0.44–1.00)
GFR, Estimated: >60 mL/min (ref >60)
Glucose, Bld: 78 mg/dL (ref 70–99)
Potassium: 3.5 mmol/L (ref 3.5–5.1)
Sodium: 140 mmol/L (ref 135–145)
Total Bilirubin: 0.7 mg/dL (ref 0.3–1.2)
Total Protein: 6.9 g/dL (ref 6.5–8.1)

## 2022-09-20 LAB — CBC
HCT: 41.7 % (ref 36.0–46.0)
Hemoglobin: 12.8 g/dL (ref 12.0–15.0)
MCH: 25.5 pg — ABNORMAL LOW (ref 26.0–34.0)
MCHC: 30.7 g/dL (ref 30.0–36.0)
MCV: 83.1 fL (ref 80.0–100.0)
Platelets: 357 10*3/uL (ref 150–400)
RBC: 5.02 MIL/uL (ref 3.87–5.11)
RDW: 14.1 % (ref 11.5–15.5)
WBC: 6.2 10*3/uL (ref 4.0–10.5)
nRBC: 0 % (ref 0.0–0.2)

## 2022-09-20 LAB — LIPASE, BLOOD: Lipase: 31 U/L (ref 11–51)

## 2022-09-20 MED ORDER — IOHEXOL 300 MG/ML  SOLN
100.0000 mL | Freq: Once | INTRAMUSCULAR | Status: AC | PRN
  Administered 2022-09-20: 100 mL via INTRAVENOUS

## 2022-09-20 MED ORDER — POLYETHYLENE GLYCOL 3350 17 G PO PACK: 17.0000 g | PACK | Freq: Every day | ORAL | 0 refills | Status: DC

## 2022-09-20 MED ORDER — BISACODYL 5 MG PO TBEC: 5.0000 mg | DELAYED_RELEASE_TABLET | Freq: Every day | ORAL | 0 refills | Status: DC | PRN

## 2022-09-20 NOTE — ED Provider Notes (Signed)
[facility] Provider Note    Event Date/Time   First MD Initiated Contact with Patient 09/20/22 1621     (approximate)   History   Abdominal Pain   HPI  [name] is a 52 y.o. 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  Patient reports that she has had thin stools recently.  She is still passing flatus today.  Patient reports that she had an episode of abdominal pain 3 weeks ago which went away, and then returned last night.  She reports that she has also had her gallbladder removed.  She denies fevers or chills.  She denies urinary symptoms.  She is concerned that she may be developing another bowel obstruction which is why she came in for evaluation.  Her surgeries were done in [place], she now lives in [place].  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 --      Excl. in GC? --     Most recent vital signs: Vitals:   09/20/22 1542  BP: (!)  172/103  Pulse: 86  Resp: 18  Temp: 98.1 F (36.7 C)  SpO2: 97%    Physical Exam Vitals and nursing note reviewed.  Constitutional:      General: Awake and alert. No acute distress.    Appearance: Normal appearance. The patient is overweight.  HENT:     Head: Normocephalic and atraumatic.     Mouth: Mucous membranes are moist.  Eyes:     General: PERRL. Normal EOMs        Right eye: No discharge.        Left eye: No discharge.     Conjunctiva/sclera: Conjunctivae normal.  Cardiovascular:     Rate and Rhythm: Normal rate and regular rhythm.     Pulses: Normal pulses.  Pulmonary:     Effort: Pulmonary effort is normal. No respiratory distress.     Breath sounds: Normal breath sounds.  Abdominal:     Abdomen is soft. There is a large midline surgical incision.  There is mild epigastric abdominal tenderness. No rebound or guarding. No distention. Musculoskeletal:        General: No swelling. Normal range of motion.  Cervical back: Normal range of motion and neck supple.  Skin:    General: Skin is warm and dry.     Capillary Refill: Capillary refill takes less than 2 seconds.     Findings: No rash.  Neurological:     Mental Status: The patient is awake and alert.      ED Results / Procedures / Treatments   Labs (all labs ordered are listed, but only abnormal results are displayed) Labs Reviewed  COMPREHENSIVE METABOLIC PANEL - Abnormal; Notable for the following components:      Result Value   Alkaline Phosphatase 128 (*)    All other components within normal limits  CBC - Abnormal; Notable for the following components:   MCH 25.5 (*)    All other components within normal limits  URINALYSIS, ROUTINE W REFLEX MICROSCOPIC - Abnormal; Notable for the following components:   Color, Urine YELLOW (*)    APPearance CLOUDY (*)    Ketones, ur 5 (*)    Leukocytes,Ua TRACE (*)    All other components within normal limits  LIPASE, BLOOD     EKG     RADIOLOGY I  independently reviewed and interpreted imaging and agree with radiologists findings.     PROCEDURES:  Critical Care performed:   Procedures   MEDICATIONS ORDERED IN ED: Medications  iohexol (OMNIPAQUE) 300 MG/ML solution 100 mL (100 mLs Intravenous Contrast Given 09/20/22 1648)     IMPRESSION / MDM / ASSESSMENT AND PLAN / ED COURSE  I reviewed the triage vital signs and the nursing notes.   Differential diagnosis includes, but is not limited to, small bowel obstruction, large bowel obstruction, constipation, fecal impaction, marginal ulcer, gastritis.  I reviewed the patient's chart.  Patient has been seen by gastroenterology, most recently 09/15/2022 at which time she was seen for iron deficient anemia.  She underwent colonoscopy on 07/07/2022 which revealed 15 mm pedunculated polyp that was resected, otherwise Roux-en-Y gastric bypass appeared normal and the rest of her colonoscopy was normal.  She also had a gastric biopsy and EGD which revealed normal polyps in the stomach which were resected.  Patient is awake and alert, nontoxic in appearance.  I do not appreciate abdominal distention on exam, and her abdomen is soft.  No rebound or guarding.  Labs obtained in triage are overall reassuring, no leukocytosis, normal LFTs and lipase.  CT scan obtained given her numerous previous abdominal surgeries no history of SBO, and this was negative for obstruction, though did demonstrate findings consistent with constipation.  Discussed this finding with the patient.  She has not attempted anything at home to help with her constipation.  She was prescribed a stool softener as well as MiraLAX.  We also discussed the importance of gentle exercise to help increase gastric motility, and increasing the fiber in her diet.  Discussed the importance of close outpatient follow-up as well.  Patient understands and agrees with plan.  She was discharged in stable condition.  Patient's presentation is most  consistent with acute presentation with potential threat to life or bodily function.      FINAL CLINICAL IMPRESSION(S) / ED DIAGNOSES   Final diagnoses:  Constipation, unspecified constipation type     Rx / DC Orders   ED Discharge Orders          Ordered    bisacodyl (DULCOLAX) 5 MG EC tablet  Daily PRN        09/20/22 1739    polyethylene glycol (MIRALAX) 17 g packet  Daily        09/20/22 1739             Note:  This document was prepared using Dragon voice recognition software and may include unintentional dictation errors.   Keturah Shavers 09/20/22 1807    Concha Se, MD 09/21/22 [phone]

## 2022-09-20 NOTE — Discharge Instructions (Signed)
You may take the medications as prescribed to help with your constipation.  Remember to increase the fiber in your diet, increase your fluid consumption, and perform gentle exercise such as walking to help your gastric motility.  There is no obstruction on your CAT scan.  Please follow-up with your outpatient provider.  Please return for any new, worsening, or change in symptoms or other concerns.  It was a pleasure caring for you today.

## 2022-09-20 NOTE — ED Triage Notes (Signed)
Pt reports that she had gastric bypass about 2 years ago and afterwards got a SBO, states that she has been having worsening abd pain, constipation x 1 month and thinks may have a SBO again. Pt in NAD at this time.

## 2022-09-28 ENCOUNTER — Encounter: Payer: Self-pay | Admitting: Gastroenterology

## 2022-09-28 ENCOUNTER — Ambulatory Visit (INDEPENDENT_AMBULATORY_CARE_PROVIDER_SITE_OTHER): Payer: BC Managed Care – PPO | Admitting: Gastroenterology

## 2022-09-28 VITALS — BP 137/84 | HR 66 | Temp 98.2°F | Ht 65.5 in | Wt 162.0 lb

## 2022-09-28 DIAGNOSIS — K5909 Other constipation: Secondary | ICD-10-CM | POA: Diagnosis not present

## 2022-09-28 DIAGNOSIS — D508 Other iron deficiency anemias: Secondary | ICD-10-CM

## 2022-09-28 NOTE — Progress Notes (Signed)
[name] MD, MRCP(U.K) [address]  [address]  [place], [place] [address]  Main: [phone]  Fax: [phone]   Primary Care Physician: Berniece Salines, FNP  Primary Gastroenterologist:  Dr. [name]   Chief Complaint  Patient presents with   Anemia    HPI: [name] is a 52 y.o. female Expand All Collapse All    [name] MD, MRCP(U.K) [address]  [address]  [place], [place] [address]  Main: [phone]  Fax: [phone]     Primary Care Physician: Berniece Salines, FNP   Primary Gastroenterologist:  Dr. [name]    Follow-up for Anemia HPI: [name] is a 52 y.o. female     Summary of history : Initially referred and seen for iron deficiency anemia in January 2024. Labs in December 2023 demonstrated a ferritin of 4, hemoglobin 11.5 g with an MCV of 80.1.  CMP was normal.  No prior procedures on epic but the patient mentioned that had her last colonoscopy 04/18/2020 precancerous polyps were noted.   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  She is not on an iron supplement.  Denies any hematemesis denies any melena not on any NSAIDs denies any abdominal pain.  No nosebleeds has occasionally seen blood in the urine on 1 occasion may be more.  Has noticed a change in the shape of her stool which is pencil thin recently associated with bloating and gas.  He has had precancerous polyps and a colonoscopy in 2016 as well as in 2021.     Interval history 06/06/2022-09/28/2022   On 01/17/2023: B12 folate, Celiac serology, urine analysis, H. pylori breath test normal/negative.      07/07/2022 to 01/17/2023: Colonoscopy/EGD: EGD showed 15 mm pedunculated polyp that was resected otherwise Roux-en-Y gastric bypass appeared normal colonoscopy was normal.   Gastric biopsies normal jejunal  biopsies normal polyps in the stomach resected was a fundic gland polyp.   09/17/2022 seen in the ER for abdominal pain and found that she may have constipation she underwent a CT scan of the abdomen that showed moderate retained stool throughout the colon consistent with constipation.  Hemoglobin was 12.8 g.  Since the ER visit she is having no abdominal pains.  She recollects she had adequate bowel movements even prior to the ER visit.  No other complaints she is not taking her oral iron at this point of time.   Current Outpatient Medications  Medication Sig Dispense Refill   bisacodyl (DULCOLAX) 5 MG EC tablet Take 1 tablet (5 mg total) by mouth daily as needed for moderate constipation. 15 tablet 0   ferrous sulfate 325 (65 FE) MG tablet TAKE 1 TABLET BY MOUTH EVERY OTHER DAY 45 tablet 1   hydrOXYzine (VISTARIL) 25 MG capsule Take 25 mg by mouth 3 (three) times daily as needed.     omeprazole (PRILOSEC) 40 MG capsule Take 1 capsule (40 mg total) by mouth daily. 90 capsule 3   polyethylene glycol (MIRALAX) 17 g packet Take 17 g by mouth daily. 14 each 0   traZODone (DESYREL) 50 MG tablet TAKE 1/2 TO 1 TABLET(25 TO 50 MG) BY MOUTH AT BEDTIME AS NEEDED FOR SLEEP 30 tablet 2   No current facility-administered medications for this visit.    Allergies as of 09/28/2022   (No Known Allergies)  ROS:  General: Negative for anorexia, weight loss, fever, chills, fatigue, weakness. ENT: Negative for hoarseness, difficulty swallowing , nasal congestion. CV: Negative for chest pain, angina, palpitations, dyspnea on exertion, peripheral edema.  Respiratory: Negative for dyspnea at rest, dyspnea on exertion, cough, sputum, wheezing.  GI: See history of present illness. GU:  Negative for dysuria, hematuria, urinary incontinence, urinary frequency, nocturnal urination.  Endo: Negative for unusual weight change.    Physical Examination:   BP 137/84   Pulse 66   Temp 98.2 F (36.8 C) (Oral)    Ht 5' 5.5" (1.664 m)   Wt 162 lb (73.5 kg)   BMI 26.55 kg/m   General: Well-nourished, well-developed in no acute distress.  Eyes: No icterus. Conjunctivae pink. Mouth: Oropharyngeal mucosa moist and pink , no lesions erythema or exudate. Neuro: Alert and oriented x 3.  Grossly intact. Skin: Warm and dry, no jaundice.   Psych: Alert and cooperative, normal mood and affect.   Imaging Studies: CT ABDOMEN PELVIS W CONTRAST  Result Date: 09/20/2022 CLINICAL DATA:  Abdominal pain, history of bariatric surgery 2 years ago with prior small bowel obstruction, worsening abdominal pain for 1 month EXAM: CT ABDOMEN AND PELVIS WITH CONTRAST TECHNIQUE: Multidetector CT imaging of the abdomen and pelvis was performed using the standard protocol following bolus administration of intravenous contrast. RADIATION DOSE REDUCTION: This exam was performed according to the departmental dose-optimization program which includes automated exposure control, adjustment of the mA and/or kV according to patient size and/or use of iterative reconstruction technique. CONTRAST:  OMNIPAQUE IOHEXOL 300 MG/ML  SOLN COMPARISON:  None Available. FINDINGS: Lower chest: No acute pleural or parenchymal lung disease. Hepatobiliary: No focal liver abnormality is seen. Status post cholecystectomy. No biliary dilatation. Pancreas: Unremarkable. No pancreatic ductal dilatation or surrounding inflammatory changes. Spleen: Normal in size without focal abnormality. Adrenals/Urinary Tract: Kidneys enhance normally and symmetrically. No urinary tract calculi or obstructive uropathy. The adrenals are grossly unremarkable. The bladder is minimally distended, limiting its evaluation. Stomach/Bowel: No bowel obstruction or ileus. Moderate retained stool throughout the colon consistent with constipation. Prior appendectomy. Postsurgical changes from bariatric surgery. Small hiatal hernia. No bowel wall thickening or inflammatory change.  Vascular/Lymphatic: No significant vascular findings are present. No enlarged abdominal or pelvic lymph nodes. Reproductive: Status post hysterectomy. No adnexal masses. Other: No free fluid or free intraperitoneal gas. No abdominal wall hernia. Musculoskeletal: No acute or destructive bony lesions. 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 There is severe L5-S1 spondylosis and facet hypertrophy as well. Reconstructed images demonstrate no additional findings. IMPRESSION: 1. Postsurgical changes from bariatric surgery. No bowel obstruction or ileus. 2. Moderate retained stool throughout the colon consistent with constipation. 3. 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 Marked associated L5-S1 spondylosis and facet hypertrophy. Electronically Signed   By: Sharlet Salina M.D.   On: 09/20/2022 17:00    Assessment and Plan:   [name] is a 52 y.o. y/o female follow-up for iron deficiency anemia ferritin of 4.  Very likely this is due to Roux-en-Y gastric bypass which leads to impaired absorption of iron due to low acid.  In the interim since her last visit she had an episode of acute abdominal pain and she was concerned about a bowel obstruction and went to the emergency room that did not show any bowel obstruction but showed constipation.  I explained to her that it is very likely that she has scar tissue which lead  to impaired motility of the bowel and may be causing her to get constipated.  In terms of management I suggested we check her iron studies today if still low will require IV iron as oral iron may make her more constipated.  I explained to her that if she were to get abdominal discomfort the for step would be to ensure she is having adequate bowel movements to consider use MiraLAX 1-2 times a day may be an enema and see how she does if it is no better can contact us and we can get an abdominal x-ray to see how severe the stool burden is in  her abdomen.  She was very concerned to know whether she has a bowel blockage and explained to her that no such bowel blockage was seen on the CAT scan but it did not mean that she could not get it in the future as she is at increased risk of bowel obstruction from scar tissue from prior surgery.     Dr [name]  MD,MRCP [facility]) Follow up in as needed

## 2022-09-29 ENCOUNTER — Other Ambulatory Visit: Payer: Self-pay | Admitting: *Deleted

## 2022-09-29 DIAGNOSIS — D508 Other iron deficiency anemias: Secondary | ICD-10-CM

## 2022-09-29 LAB — IRON,TIBC AND FERRITIN PANEL
Ferritin: 8 ng/mL — ABNORMAL LOW (ref 15–150)
Iron Saturation: 7 % — CL (ref 15–55)
Iron: 33 ug/dL (ref 27–159)
Total Iron Binding Capacity: 472 ug/dL — ABNORMAL HIGH (ref 250–450)
UIBC: 439 ug/dL — ABNORMAL HIGH (ref 131–425)

## 2022-09-29 NOTE — Progress Notes (Signed)
Iron still low needs iv iron refer to hematology

## 2022-09-30 DIAGNOSIS — M1711 Unilateral primary osteoarthritis, right knee: Secondary | ICD-10-CM | POA: Diagnosis not present

## 2022-10-10 ENCOUNTER — Inpatient Hospital Stay: Payer: BC Managed Care – PPO

## 2022-10-10 ENCOUNTER — Other Ambulatory Visit: Payer: Self-pay | Admitting: Nurse Practitioner

## 2022-10-10 ENCOUNTER — Encounter: Payer: Self-pay | Admitting: Internal Medicine

## 2022-10-10 ENCOUNTER — Encounter: Payer: Self-pay | Admitting: Nurse Practitioner

## 2022-10-10 ENCOUNTER — Inpatient Hospital Stay: Payer: BC Managed Care – PPO | Attending: Internal Medicine | Admitting: Internal Medicine

## 2022-10-10 VITALS — BP 133/84 | HR 64 | Temp 97.5°F | Ht 65.5 in | Wt 163.3 lb

## 2022-10-10 DIAGNOSIS — F419 Anxiety disorder, unspecified: Secondary | ICD-10-CM

## 2022-10-10 DIAGNOSIS — I10 Essential (primary) hypertension: Secondary | ICD-10-CM | POA: Insufficient documentation

## 2022-10-10 DIAGNOSIS — D649 Anemia, unspecified: Secondary | ICD-10-CM

## 2022-10-10 DIAGNOSIS — F332 Major depressive disorder, recurrent severe without psychotic features: Secondary | ICD-10-CM

## 2022-10-10 DIAGNOSIS — Z9884 Bariatric surgery status: Secondary | ICD-10-CM | POA: Insufficient documentation

## 2022-10-10 DIAGNOSIS — Z803 Family history of malignant neoplasm of breast: Secondary | ICD-10-CM | POA: Diagnosis not present

## 2022-10-10 DIAGNOSIS — E611 Iron deficiency: Secondary | ICD-10-CM | POA: Insufficient documentation

## 2022-10-10 NOTE — Assessment & Plan Note (Addendum)
#   Iron deficient [APRIL 2024-PCP hemoglobin 12.8 ferritin-8/iron/7%]-patient symptomatic.  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    # Discussed option of pills versus IV iron infusion-patient is comfortable with iron infusions. Discussed the potential acute infusion reactions with IV iron; which are quite rare.  Patient understands the risk; will proceed with infusions.   #Etiology of iron deficiency: Likely secondary to gastric bypass.  Status post GI evaluation-EGD colonoscopy [FEB 2024; Dr.Anna]; no capsule. CT AP- April 2024- No kidney/bladder   # Family History cancer: mother- 49s- breast cancer; stomach cancer [died in 1989]; father- ? Cancer. 2 brother- no cancer; no maternal relatives. Perosnal hx of BRCA genetics- negative. Discuss genetic evaluation.   # Hx of HTN-improved status post bypass.  Currently not on blood pressure medication.  Repeat blood pressure systolic 130/80.  Monitor follow-up.  Thank you Ms. Pender FNP for allowing me to participate in the care of your pleasant patient. Please do not hesitate to contact me with questions or concerns in the interim.  # DISPOSITION: # repeat BP today-  # NO labs today # weekly venofer x3  # follow up 3 months- MD; 2-3 day PRIOR- labs- cbc/bmp;LDH; iron studies; ferritin; vit D 25 OH; B12-  possible venofer- Dr.B

## 2022-10-10 NOTE — Progress Notes (Signed)
Fatigue/weakness: tired all the time Dyspena: no  Light headedness: no  Blood in stool: no   C/o stomach pain and sickness after eating. Has had gastric bi-pass. Gets a "build Up" of stool. Had scan done in ED. Eats a lot of nuts/protein.

## 2022-10-10 NOTE — Progress Notes (Signed)
[facility] CONSULT NOTE  Patient Care Team: Berniece Salines, FNP as PCP - General (Nurse Practitioner)  CHIEF COMPLAINTS/PURPOSE OF CONSULTATION: ANEMIA   HEMATOLOGY HISTORY  # ANEMIA[Hb; MCV-platelets- WBC; Iron sat; ferritin;  GFR- CT/US- ;  EGD/colonoscopy-FEB 2024 [Dr.Anna]-unremarkable.   Latest Reference Range & Units 09/28/22 14:17  Iron 27 - 159 ug/dL 33  UIBC 161 - 096 ug/dL 045 (H)  TIBC 409 - 811 ug/dL 914 (H)  Ferritin 15 - 150 ng/mL 8 (L)  Iron Saturation 15 - 55 % 7 (LL)  (LL): Data is critically low (H): Data is abnormally high (L): Data is abnormally low  HISTORY OF PRESENTING ILLNESS: Patient ambulating-independently.  Alone.   [name] 52 y.o.  female pleasant patient was been referred to [place] for further evaluation of iron deficiency.  Patient complains of ongoing fatigue.  For the last many months.  Patient not taking iron pills regular basis.  Blood in stools: none Blood in urine:  none Difficulty swallowing: not regular.  Change of bowel movement/constipation:  Prior blood transfusion: none Prior history of blood loss: none Liver disease: none Alcohol: none Bariatric surgery: 2020  Vaginal bleeding: none [S/p TAH- intact ovaries] Prior evaluation with hematology: none Prior bone marrow biopsy: none Oral iron: intermittent- compliance  Prior IV iron infusions: none   Review of Systems  Constitutional:  Positive for malaise/fatigue. Negative for chills, diaphoresis, fever and weight loss.  HENT:  Negative for nosebleeds and sore throat.   Eyes:  Negative for double vision.  Respiratory:  Negative for cough, hemoptysis, sputum production, shortness of breath and wheezing.   Cardiovascular:  Negative for chest pain, palpitations, orthopnea and leg swelling.  Gastrointestinal:  Negative for abdominal pain, blood in stool, constipation, diarrhea, heartburn, melena, nausea and vomiting.  Genitourinary:  Negative for dysuria,  frequency and urgency.  Musculoskeletal:  Negative for back pain and joint pain.  Skin: Negative.  Negative for itching and rash.  Neurological:  Negative for dizziness, tingling, focal weakness, weakness and headaches.  Endo/Heme/Allergies:  Does not bruise/bleed easily.  Psychiatric/Behavioral:  Negative for depression. The patient is not nervous/anxious and does not have insomnia.      MEDICAL HISTORY:  Past Medical History:  Diagnosis Date   Anxiety    Bowel obstruction (HCC)    Depression    Hypertension     SURGICAL HISTORY: Past Surgical History:  Procedure Laterality Date   ABDOMINAL HYSTERECTOMY     CHOLECYSTECTOMY     COLONOSCOPY WITH PROPOFOL N/A 07/07/2022   Procedure: COLONOSCOPY WITH PROPOFOL;  Surgeon: [name], MD;  Location: [facility] ENDOSCOPY;  Service: Gastroenterology;  Laterality: N/A;   ESOPHAGOGASTRODUODENOSCOPY N/A 07/07/2022   Procedure: ESOPHAGOGASTRODUODENOSCOPY (EGD);  Surgeon: [name], MD;  Location[facility] ENDOSCOPY;  Service: Gastroenterology;  Laterality: N/A;   GASTRIC BYPASS      SOCIAL HISTORY: Social History   Socioeconomic History   Marital status: Married    Spouse name: Gilda Crease   Number of children: 3   Years of education: Not on file   Highest education level: Some college, no degree  Occupational History   Not on file  Tobacco Use   Smoking status: Never   Smokeless tobacco: Never  Vaping Use   Vaping Use: Never used  Substance and Sexual Activity   Alcohol use: Yes    Comment: occassional/social   Drug use: Yes    Types: Marijuana   Sexual activity: Not on file  Other Topics Concern   Not on  file  Social History Narrative   Moved from [place] to [place] then went to [place] and back in [place] for 7 years   Social Determinants of Health   Financial Resource Strain: Low Risk  (05/25/2022)   Overall Financial Resource Strain (CARDIA)    Difficulty of Paying Living Expenses: Not hard at all  Food Insecurity: No Food  Insecurity (10/10/2022)   Hunger Vital Sign    Worried About Running Out of Food in the Last Year: Never true    Ran Out of Food in the Last Year: Never true  Transportation Needs: No Transportation Needs (10/10/2022)   PRAPARE - Administrator, Civil Service (Medical): No    Lack of Transportation (Non-Medical): No  Physical Activity: Sufficiently Active (05/25/2022)   Exercise Vital Sign    Days of Exercise per Week: 4 days    Minutes of Exercise per Session: 60 min  Stress: Stress Concern Present (05/25/2022)   [employer] of Occupational Health - Occupational Stress Questionnaire    Feeling of Stress : To some extent  Social Connections: Moderately Integrated (05/25/2022)   Social Connection and Isolation Panel [NHANES]    Frequency of Communication with Friends and Family: Twice a week    Frequency of Social Gatherings with Friends and Family: Once a week    Attends Religious Services: 1 to 4 times per year    Active Member of [employer] or Organizations: No    Attends Banker Meetings: Never    Marital Status: Married  Catering manager Violence: Not At Risk (10/10/2022)   Humiliation, Afraid, Rape, and Kick questionnaire    Fear of Current or Ex-Partner: No    Emotionally Abused: No    Physically Abused: No    Sexually Abused: No    FAMILY HISTORY: Family History  Problem Relation Age of Onset   Cancer Mother    Heart disease Father    Alcohol abuse Father    Heart disease Brother    Anxiety disorder Brother    Diabetes Paternal Grandmother     ALLERGIES:  has No Known Allergies.  MEDICATIONS:  Current Outpatient Medications  Medication Sig Dispense Refill   bisacodyl (DULCOLAX) 5 MG EC tablet Take 1 tablet (5 mg total) by mouth daily as needed for moderate constipation. 15 tablet 0   ferrous sulfate 325 (65 FE) MG tablet TAKE 1 TABLET BY MOUTH EVERY OTHER DAY 45 tablet 1   omeprazole (PRILOSEC) 40 MG capsule Take 1 capsule (40 mg total) by  mouth daily. 90 capsule 3   polyethylene glycol (MIRALAX) 17 g packet Take 17 g by mouth daily. 14 each 0   traZODone (DESYREL) 50 MG tablet TAKE 1/2 TO 1 TABLET(25 TO 50 MG) BY MOUTH AT BEDTIME AS NEEDED FOR SLEEP 30 tablet 2   No current facility-administered medications for this visit.     PHYSICAL EXAMINATION:   Vitals:   10/10/22 1355 10/10/22 1447  BP: (!) 167/98 133/84  Pulse: 64   Temp: (!) 97.5 F (36.4 C)   SpO2: 100%    Filed Weights   10/10/22 1355  Weight: 163 lb 4.8 oz (74.1 kg)    Physical Exam Vitals and nursing note reviewed.  HENT:     Head: Normocephalic and atraumatic.     Mouth/Throat:     Pharynx: Oropharynx is clear.  Eyes:     Extraocular Movements: Extraocular movements intact.     Pupils: Pupils are equal, round, and reactive to  light.  Cardiovascular:     Rate and Rhythm: Normal rate and regular rhythm.  Pulmonary:     Comments: Decreased breath sounds bilaterally.  Abdominal:     Palpations: Abdomen is soft.  Musculoskeletal:        General: Normal range of motion.     Cervical back: Normal range of motion.  Skin:    General: Skin is warm.  Neurological:     General: No focal deficit present.     Mental Status: She is alert and oriented to person, place, and time.  Psychiatric:        Behavior: Behavior normal.        Judgment: Judgment normal.      LABORATORY DATA:  I have reviewed the data as listed Lab Results  Component Value Date   WBC 6.2 09/20/2022   HGB 12.8 09/20/2022   HCT 41.7 09/20/2022   MCV 83.1 09/20/2022   PLT 357 09/20/2022   Recent Labs    10/11/21 0913 05/25/22 1403 09/20/22 1545  NA 143 143 140  K 4.6 3.9 3.5  CL 106 107 106  CO2 30 28 24   GLUCOSE 85 77 78  BUN 11 11 12   CREATININE 0.57 0.64 0.61  CALCIUM 9.2 8.9 9.0  GFRNONAA  --   --  >60  PROT 6.2 6.4 6.9  ALBUMIN  --   --  4.1  AST 25 22 26   ALT 23 20 20   ALKPHOS  --   --  128*  BILITOT 0.7 0.5 0.7     CT ABDOMEN PELVIS W  CONTRAST  Result Date: 09/20/2022 CLINICAL DATA:  Abdominal pain, history of bariatric surgery 2 years ago with prior small bowel obstruction, worsening abdominal pain for 1 month EXAM: CT ABDOMEN AND PELVIS WITH CONTRAST TECHNIQUE: Multidetector CT imaging of the abdomen and pelvis was performed using the standard protocol following bolus administration of intravenous contrast. RADIATION DOSE REDUCTION: This exam was performed according to the departmental dose-optimization program which includes automated exposure control, adjustment of the mA and/or kV according to patient size and/or use of iterative reconstruction technique. CONTRAST:  OMNIPAQUE IOHEXOL 300 MG/ML  SOLN COMPARISON:  None Available. FINDINGS: Lower chest: No acute pleural or parenchymal lung disease. Hepatobiliary: No focal liver abnormality is seen. Status post cholecystectomy. No biliary dilatation. Pancreas: Unremarkable. No pancreatic ductal dilatation or surrounding inflammatory changes. Spleen: Normal in size without focal abnormality. Adrenals/Urinary Tract: Kidneys enhance normally and symmetrically. No urinary tract calculi or obstructive uropathy. The adrenals are grossly unremarkable. The bladder is minimally distended, limiting its evaluation. Stomach/Bowel: No bowel obstruction or ileus. Moderate retained stool throughout the colon consistent with constipation. Prior appendectomy. Postsurgical changes from bariatric surgery. Small hiatal hernia. No bowel wall thickening or inflammatory change. Vascular/Lymphatic: No significant vascular findings are present. No enlarged abdominal or pelvic lymph nodes. Reproductive: Status post hysterectomy. No adnexal masses. Other: No free fluid or free intraperitoneal gas. No abdominal wall hernia. Musculoskeletal: No acute or destructive bony lesions. 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 There is severe L5-S1 spondylosis and facet hypertrophy as well.  Reconstructed images demonstrate no additional findings. IMPRESSION: 1. Postsurgical changes from bariatric surgery. No bowel obstruction or ileus. 2. Moderate retained stool throughout the colon consistent with constipation. 3. 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 Marked associated L5-S1 spondylosis and facet hypertrophy. Electronically Signed   By: Sharlet Salina M.D.   On: 09/20/2022 17:00  ASSESSMENT & PLAN:   Symptomatic anemia # Iron deficient [APRIL 2024-PCP hemoglobin 12.8 ferritin-8/iron/7%]-patient symptomatic.  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    # Discussed option of pills versus IV iron infusion-patient is comfortable with iron infusions. Discussed the potential acute infusion reactions with IV iron; which are quite rare.  Patient understands the risk; will proceed with infusions.   #Etiology of iron deficiency: Likely secondary to gastric bypass.  Status post GI evaluation-EGD colonoscopy [FEB 2024; Dr.Anna]; no capsule. CT AP- April 2024- No kidney/bladder   # Family History cancer: mother- 67s- breast cancer; stomach cancer [died in 1989]; father- ? Cancer. 2 brother- no cancer; no maternal relatives. Perosnal hx of BRCA genetics- negative. Discuss genetic evaluation.   # Hx of HTN-improved status post bypass.  Currently not on blood pressure medication.  Repeat blood pressure systolic 130/80.  Monitor follow-up.  Thank you Ms. Pender FNP for allowing me to participate in the care of your pleasant patient. Please do not hesitate to contact me with questions or concerns in the interim.  # DISPOSITION: # repeat BP today-  # NO labs today # weekly venofer x3  # follow up 3 months- MD; 2-3 day PRIOR- labs- cbc/bmp;LDH; iron studies; ferritin; vit D 25 OH; B12-  possible venofer- Dr.B  All questions were answered. The patient knows to call the clinic with any problems, questions or concerns.   Earna Coder, MD 10/10/2022  3:25 PM

## 2022-10-13 ENCOUNTER — Ambulatory Visit
Admission: RE | Admit: 2022-10-13 | Discharge: 2022-10-13 | Disposition: A | Discharge status: Home / Self Care | Payer: BC Managed Care – PPO | Source: Ambulatory Visit | Attending: Nurse Practitioner | Admitting: Nurse Practitioner

## 2022-10-13 DIAGNOSIS — Z1231 Encounter for screening mammogram for malignant neoplasm of breast: Secondary | ICD-10-CM | POA: Diagnosis not present

## 2022-10-13 DIAGNOSIS — Z1211 Encounter for screening for malignant neoplasm of colon: Secondary | ICD-10-CM | POA: Diagnosis not present

## 2022-10-13 DIAGNOSIS — Z1212 Encounter for screening for malignant neoplasm of rectum: Secondary | ICD-10-CM | POA: Insufficient documentation

## 2022-10-18 ENCOUNTER — Inpatient Hospital Stay
Admission: RE | Admit: 2022-10-18 | Discharge: 2022-10-18 | Disposition: A | Discharge status: Home / Self Care | Payer: Self-pay | Source: Ambulatory Visit | Attending: Nurse Practitioner | Admitting: Nurse Practitioner

## 2022-10-18 ENCOUNTER — Other Ambulatory Visit: Payer: Self-pay | Admitting: *Deleted

## 2022-10-18 DIAGNOSIS — Z1231 Encounter for screening mammogram for malignant neoplasm of breast: Secondary | ICD-10-CM

## 2022-10-19 ENCOUNTER — Encounter: Payer: Self-pay | Admitting: Nurse Practitioner

## 2022-10-20 ENCOUNTER — Inpatient Hospital Stay: Payer: BC Managed Care – PPO

## 2022-10-20 VITALS — BP 138/85 | HR 63 | Temp 97.6°F | Resp 18

## 2022-10-20 DIAGNOSIS — Z9884 Bariatric surgery status: Secondary | ICD-10-CM | POA: Diagnosis not present

## 2022-10-20 DIAGNOSIS — D649 Anemia, unspecified: Secondary | ICD-10-CM

## 2022-10-20 DIAGNOSIS — I10 Essential (primary) hypertension: Secondary | ICD-10-CM | POA: Diagnosis not present

## 2022-10-20 DIAGNOSIS — E611 Iron deficiency: Secondary | ICD-10-CM | POA: Diagnosis not present

## 2022-10-20 DIAGNOSIS — Z803 Family history of malignant neoplasm of breast: Secondary | ICD-10-CM | POA: Diagnosis not present

## 2022-10-20 MED ORDER — SODIUM CHLORIDE 0.9 % IV SOLN
Freq: Once | INTRAVENOUS | Status: AC
  Filled 2022-10-20: qty 250

## 2022-10-20 MED ORDER — SODIUM CHLORIDE 0.9 % IV SOLN
200.0000 mg | Freq: Once | INTRAVENOUS | Status: AC
  Administered 2022-10-20: 200 mg via INTRAVENOUS
  Filled 2022-10-20: qty 200

## 2022-10-20 NOTE — Patient Instructions (Signed)

## 2022-10-26 ENCOUNTER — Inpatient Hospital Stay: Payer: BC Managed Care – PPO

## 2022-10-26 VITALS — BP 142/82 | HR 75 | Temp 97.0°F | Resp 18

## 2022-10-26 DIAGNOSIS — E611 Iron deficiency: Secondary | ICD-10-CM | POA: Diagnosis not present

## 2022-10-26 DIAGNOSIS — I10 Essential (primary) hypertension: Secondary | ICD-10-CM | POA: Diagnosis not present

## 2022-10-26 DIAGNOSIS — Z9884 Bariatric surgery status: Secondary | ICD-10-CM | POA: Diagnosis not present

## 2022-10-26 DIAGNOSIS — D649 Anemia, unspecified: Secondary | ICD-10-CM

## 2022-10-26 DIAGNOSIS — Z803 Family history of malignant neoplasm of breast: Secondary | ICD-10-CM | POA: Diagnosis not present

## 2022-10-26 MED ORDER — SODIUM CHLORIDE 0.9 % IV SOLN
Freq: Once | INTRAVENOUS | Status: AC
  Filled 2022-10-26: qty 250

## 2022-10-26 MED ORDER — SODIUM CHLORIDE 0.9 % IV SOLN
200.0000 mg | Freq: Once | INTRAVENOUS | Status: AC
  Administered 2022-10-26: 200 mg via INTRAVENOUS
  Filled 2022-10-26: qty 200

## 2022-10-27 ENCOUNTER — Ambulatory Visit: Payer: BC Managed Care – PPO

## 2022-10-27 DIAGNOSIS — M1711 Unilateral primary osteoarthritis, right knee: Secondary | ICD-10-CM | POA: Diagnosis not present

## 2022-11-01 ENCOUNTER — Telehealth: Payer: Self-pay | Admitting: *Deleted

## 2022-11-01 ENCOUNTER — Encounter: Payer: Self-pay | Admitting: Internal Medicine

## 2022-11-01 NOTE — Telephone Encounter (Signed)
Patient called stating that she is no better after 2 iron infusions and that she is actually worse. SH eis sleeping 10 + hours and still having dfficulty staying awake, is fatigued and she had a severe headache yesterday. She is out of town this week and has scheduled appointment for infusion 3 of iron next week , but she is asking if she needs to e seen prior to infusion and or have labs checked first. Please advise

## 2022-11-07 ENCOUNTER — Encounter: Payer: Self-pay | Admitting: Nurse Practitioner

## 2022-11-10 ENCOUNTER — Inpatient Hospital Stay: Payer: BC Managed Care – PPO | Attending: Internal Medicine

## 2022-11-10 VITALS — BP 142/86 | HR 56 | Temp 97.5°F | Resp 16

## 2022-11-10 DIAGNOSIS — E611 Iron deficiency: Secondary | ICD-10-CM | POA: Insufficient documentation

## 2022-11-10 DIAGNOSIS — Z9884 Bariatric surgery status: Secondary | ICD-10-CM | POA: Diagnosis not present

## 2022-11-10 DIAGNOSIS — D649 Anemia, unspecified: Secondary | ICD-10-CM

## 2022-11-10 MED ORDER — SODIUM CHLORIDE 0.9 % IV SOLN
Freq: Once | INTRAVENOUS | Status: AC
  Filled 2022-11-10: qty 250

## 2022-11-10 MED ORDER — SODIUM CHLORIDE 0.9 % IV SOLN
200.0000 mg | Freq: Once | INTRAVENOUS | Status: AC
  Administered 2022-11-10: 200 mg via INTRAVENOUS
  Filled 2022-11-10: qty 200

## 2022-11-10 NOTE — Patient Instructions (Signed)

## 2022-11-10 NOTE — Progress Notes (Signed)
Declined 30 minute post observation. Aware of risks. Vitals stable at discharge.

## 2022-11-11 ENCOUNTER — Encounter: Payer: Self-pay | Admitting: Nurse Practitioner

## 2022-11-11 ENCOUNTER — Ambulatory Visit: Payer: BC Managed Care – PPO | Admitting: Nurse Practitioner

## 2022-11-11 ENCOUNTER — Other Ambulatory Visit: Payer: Self-pay

## 2022-11-11 VITALS — BP 138/84 | HR 68 | Temp 97.9°F | Resp 16 | Ht 65.5 in | Wt 163.7 lb

## 2022-11-11 DIAGNOSIS — R5383 Other fatigue: Secondary | ICD-10-CM

## 2022-11-11 DIAGNOSIS — D509 Iron deficiency anemia, unspecified: Secondary | ICD-10-CM

## 2022-11-11 DIAGNOSIS — R519 Headache, unspecified: Secondary | ICD-10-CM

## 2022-11-11 DIAGNOSIS — I10 Essential (primary) hypertension: Secondary | ICD-10-CM

## 2022-11-11 LAB — POCT URINALYSIS DIPSTICK
Bilirubin, UA: NEGATIVE
Blood, UA: NEGATIVE
Glucose, UA: NEGATIVE
Ketones, UA: NEGATIVE
Leukocytes, UA: NEGATIVE
Nitrite, UA: NEGATIVE
Protein, UA: NEGATIVE
Spec Grav, UA: 1.02 (ref 1.010–1.025)
Urobilinogen, UA: 0.2 E.U./dL — NL
pH, UA: 5 (ref 5.0–8.0)

## 2022-11-11 LAB — CBC WITH DIFFERENTIAL/PLATELET
HCT: 38 % (ref 35.0–45.0)
Hemoglobin: 12.2 g/dL (ref 11.7–15.5)
MCV: 82.6 fL (ref 80.0–100.0)
Neutro Abs: 3677 cells/uL (ref 1500–7800)
RBC: 4.6 10*6/uL (ref 3.80–5.10)

## 2022-11-11 MED ORDER — LISINOPRIL 5 MG PO TABS: 5.0000 mg | ORAL_TABLET | Freq: Every day | ORAL | 0 refills | Status: DC

## 2022-11-11 NOTE — Assessment & Plan Note (Signed)
Start lisinopril 5 mg daily.  Continue to exercise.  Stay hydrated.

## 2022-11-11 NOTE — Progress Notes (Signed)
BP 138/84   Pulse 68   Temp 97.9 F (36.6 C) (Other (Comment))   Resp 16   Ht 5' 5.5" (1.664 m)   Wt 163 lb 11.2 oz (74.3 kg)   SpO2 99%   BMI 26.83 kg/m    Subjective:    Patient ID: [name], female    DOB: 1970/11/23, 51 y.o.   MRN: [id number]  HPI: [name] is a 52 y.o. female  Chief Complaint  Patient presents with   Hypertension   Fatigue   Hypertension:  -Medications: previously on lisinopril and metoprolol, has not been on medication for awhile -Patient is compliant with above medications and reports no side effects. -elevated blood pressure readings at iron infusions 156/78, 142/86 -Denies any SOB, CP, vision changes, LE edema or symptoms of hypotension. She does report headaches.  -Diet: recommend DASH diet -Exercise: increase physical activity as tolerated.   Will restart lisinopril 5 mg daily.     11/11/2022    2:43 PM 11/10/2022    2:05 PM 11/10/2022    1:32 PM  Vitals with BMI  Height 5' 5.5"    Weight 163 lbs 11 oz    BMI 26.82    Systolic 138 142 347  Diastolic 84 86 78  Pulse 68 56 57    Headaches: she reports that she woke up with a headache. She says that she has been having more headaches recently . She reports she has had 1 headache every other day.   She says she has never had headaches like this before.  She denies any blurred vision.  She reports the pain is a throbbing pain on both sides of her temples.  Will get ct scan.   Fatigue/iron deficiency anemia:she says last week she felt really fatigued, was falling asleep in meetings.  She reports that she just feels exhausted.  She denies any fever.  She denies any recent illness.  She reports just feeling tired.   She does have a history of iron deficiency anemia and is getting iron infusions.  Most recent infusion was yesterday. She says she does not see hematology again until August.  Will get labs and urine dip.  Relevant past medical, surgical, family and social history  reviewed and updated as indicated. Interim medical history since our last visit reviewed. Allergies and medications reviewed and updated.  Review of Systems Constitutional: Negative for fever or weight change.positive for fatigue  Respiratory: Negative for cough and shortness of breath.   Cardiovascular: Negative for chest pain or palpitations.  Gastrointestinal: Negative for abdominal pain, no bowel changes.  Musculoskeletal: Negative for gait problem or joint swelling.  Skin: Negative for rash.  Neurological: Negative for dizziness, positive for headache.  No other specific complaints in a complete review of systems (except as listed in HPI above).      Objective:    BP 138/84   Pulse 68   Temp 97.9 F (36.6 C) (Other (Comment))   Resp 16   Ht 5' 5.5" (1.664 m)   Wt 163 lb 11.2 oz (74.3 kg)   SpO2 99%   BMI 26.83 kg/m   Wt Readings from Last 3 Encounters:  11/11/22 163 lb 11.2 oz (74.3 kg)  10/10/22 163 lb 4.8 oz (74.1 kg)  09/28/22 162 lb (73.5 kg)    Physical Exam   Constitutional: Patient appears well-developed and well-nourished.  No distress.  HEENT: head atraumatic, normocephalic, pupils equal and reactive to light, neck supple Cardiovascular: Normal rate, regular rhythm  and normal heart sounds.  No murmur heard. No BLE edema. Pulmonary/Chest: Effort normal and breath sounds normal. No respiratory distress. Abdominal: Soft.  There is no tenderness. Neuro: equal grip, normal gait, no arm drift, no slurred speech Psychiatric: Patient has a normal mood and affect. behavior is normal. Judgment and thought content normal.  Results for orders placed or performed in visit on 11/11/22  POCT urinalysis dipstick  Result Value Ref Range   Color, UA yellow    Clarity, UA clear    Glucose, UA Negative Negative   Bilirubin, UA negative    Ketones, UA negative    Spec Grav, UA 1.020 1.010 - 1.025   Blood, UA negative    pH, UA 5.0 5.0 - 8.0   Protein, UA Negative Negative    Urobilinogen, UA negative (A) 0.2 or 1.0 E.U./dL   Nitrite, UA negative    Leukocytes, UA Negative Negative   Appearance clear    Odor none       Assessment & Plan:   Problem List Items Addressed This Visit       Cardiovascular and Mediastinum   Hypertension - Primary    Start lisinopril 5 mg daily.  Continue to exercise.  Stay hydrated.        Relevant Medications   lisinopril (ZESTRIL) 5 MG tablet   Other Relevant Orders   CBC with Differential/Platelet   COMPLETE METABOLIC PANEL WITH GFR   Other Visit Diagnoses     Iron deficiency anemia, unspecified iron deficiency anemia type       getting labs   Relevant Orders   CBC with Differential/Platelet   Iron, TIBC and Ferritin Panel   Other fatigue       getting labs and ua   Relevant Orders   CBC with Differential/Platelet   COMPLETE METABOLIC PANEL WITH GFR   TSH   Iron, TIBC and Ferritin Panel   Vitamin B12   VITAMIN D 25 Hydroxy (Vit-D Deficiency, Fractures)   Magnesium   POCT urinalysis dipstick (Completed)   Frequent headaches       getting ct scan   Relevant Orders   CT HEAD WO CONTRAST ( )        Follow up plan: No follow-ups on file.

## 2022-11-12 LAB — COMPLETE METABOLIC PANEL WITH GFR
AG Ratio: 1.9 (calc) (ref 1.0–2.5)
ALT: 23 U/L (ref 6–29)
AST: 21 U/L (ref 10–35)
Albumin: 4.3 g/dL (ref 3.6–5.1)
Alkaline phosphatase (APISO): 107 U/L (ref 37–153)
BUN: 17 mg/dL (ref 7–25)
CO2: 29 mmol/L (ref 20–32)
Calcium: 9.3 mg/dL (ref 8.6–10.4)
Chloride: 105 mmol/L (ref 98–110)
Creat: 0.64 mg/dL (ref 0.50–1.03)
Globulin: 2.3 g/dL (calc) (ref 1.9–3.7)
Glucose, Bld: 87 mg/dL (ref 65–99)
Potassium: 4.2 mmol/L (ref 3.5–5.3)
Sodium: 143 mmol/L (ref 135–146)
Total Bilirubin: 0.6 mg/dL (ref 0.2–1.2)
Total Protein: 6.6 g/dL (ref 6.1–8.1)
eGFR: 106 mL/min/{1.73_m2} (ref >=60)

## 2022-11-12 LAB — CBC WITH DIFFERENTIAL/PLATELET
Absolute Monocytes: 221 cells/uL (ref 200–950)
Basophils Absolute: 11 cells/uL (ref 0–200)
Basophils Relative: 0.2 %
Eosinophils Absolute: 130 cells/uL (ref 15–500)
Eosinophils Relative: 2.4 %
Lymphs Abs: 1361 cells/uL (ref 850–3900)
MCH: 26.5 pg — ABNORMAL LOW (ref 27.0–33.0)
MCHC: 32.1 g/dL (ref 32.0–36.0)
MPV: 10.9 fL (ref 7.5–12.5)
Monocytes Relative: 4.1 %
Neutrophils Relative %: 68.1 %
Platelets: 277 10*3/uL (ref 140–400)
RDW: 16.7 % — ABNORMAL HIGH (ref 11.0–15.0)
Total Lymphocyte: 25.2 %
WBC: 5.4 10*3/uL (ref 3.8–10.8)

## 2022-11-12 LAB — IRON,TIBC AND FERRITIN PANEL
%SAT: 81 % (calc) — ABNORMAL HIGH (ref 16–45)
Ferritin: 132 ng/mL (ref 16–232)
Iron: 342 ug/dL — ABNORMAL HIGH (ref 45–160)
TIBC: 423 mcg/dL (calc) (ref 250–450)

## 2022-11-12 LAB — MAGNESIUM: Magnesium: 2.1 mg/dL (ref 1.5–2.5)

## 2022-11-12 LAB — TSH: TSH: 2.09 mIU/L

## 2022-11-13 ENCOUNTER — Other Ambulatory Visit: Payer: Self-pay | Admitting: Nurse Practitioner

## 2022-11-13 DIAGNOSIS — R5383 Other fatigue: Secondary | ICD-10-CM

## 2022-11-13 DIAGNOSIS — R519 Headache, unspecified: Secondary | ICD-10-CM

## 2022-11-14 ENCOUNTER — Encounter: Payer: Self-pay | Admitting: Family Medicine

## 2022-11-14 ENCOUNTER — Encounter: Payer: Self-pay | Admitting: Nurse Practitioner

## 2022-11-15 ENCOUNTER — Encounter: Payer: Self-pay | Admitting: Internal Medicine

## 2022-11-16 ENCOUNTER — Ambulatory Visit
Admission: RE | Admit: 2022-11-16 | Discharge: 2022-11-16 | Disposition: A | Discharge status: Home / Self Care | Payer: BC Managed Care – PPO | Source: Ambulatory Visit | Attending: Nurse Practitioner | Admitting: Nurse Practitioner

## 2022-11-16 DIAGNOSIS — R519 Headache, unspecified: Secondary | ICD-10-CM | POA: Diagnosis not present

## 2022-11-24 ENCOUNTER — Telehealth: Payer: Self-pay

## 2022-11-24 NOTE — Telephone Encounter (Signed)
Called patient, LMVM.  Inquired what type of surgery she was wanting to schedule as she and Dr. Ladona Ridgel talked about few things during her visit.  There was no surgery route as well.

## 2022-11-29 DIAGNOSIS — R519 Headache, unspecified: Secondary | ICD-10-CM | POA: Diagnosis not present

## 2022-11-29 DIAGNOSIS — R5383 Other fatigue: Secondary | ICD-10-CM | POA: Diagnosis not present

## 2022-12-06 ENCOUNTER — Telehealth: Payer: Self-pay | Admitting: Plastic Surgery

## 2022-12-06 NOTE — Telephone Encounter (Signed)
Pt called returning our call to set up a f/u with Dr Ladona Ridgel. Pt has question about CPT codes. She stated that [employer] gave her CPT codes that were covered by them. She also stated that the CPT codes that were going to be used were not covered by [employer]. She is wanting a call back

## 2022-12-06 NOTE — Telephone Encounter (Signed)
Returned patient's call and had to leave a VM.

## 2022-12-07 ENCOUNTER — Other Ambulatory Visit: Payer: Self-pay | Admitting: Student

## 2022-12-07 DIAGNOSIS — R519 Headache, unspecified: Secondary | ICD-10-CM

## 2022-12-15 DIAGNOSIS — M25561 Pain in right knee: Secondary | ICD-10-CM | POA: Diagnosis not present

## 2022-12-18 ENCOUNTER — Emergency Department (HOSPITAL_COMMUNITY): Payer: BC Managed Care – PPO

## 2022-12-18 ENCOUNTER — Other Ambulatory Visit: Payer: Self-pay

## 2022-12-18 ENCOUNTER — Emergency Department (HOSPITAL_COMMUNITY)
Admission: EM | Admit: 2022-12-18 | Discharge: 2022-12-18 | Disposition: A | Discharge status: Home / Self Care | Payer: BC Managed Care – PPO | Source: Home / Self Care | Attending: Emergency Medicine | Admitting: Emergency Medicine

## 2022-12-18 DIAGNOSIS — S3992XA Unspecified injury of lower back, initial encounter: Secondary | ICD-10-CM | POA: Diagnosis not present

## 2022-12-18 DIAGNOSIS — M25519 Pain in unspecified shoulder: Secondary | ICD-10-CM | POA: Diagnosis not present

## 2022-12-18 DIAGNOSIS — M542 Cervicalgia: Secondary | ICD-10-CM | POA: Insufficient documentation

## 2022-12-18 DIAGNOSIS — M19011 Primary osteoarthritis, right shoulder: Secondary | ICD-10-CM | POA: Diagnosis not present

## 2022-12-18 DIAGNOSIS — R0789 Other chest pain: Secondary | ICD-10-CM | POA: Insufficient documentation

## 2022-12-18 DIAGNOSIS — S3991XA Unspecified injury of abdomen, initial encounter: Secondary | ICD-10-CM | POA: Diagnosis not present

## 2022-12-18 DIAGNOSIS — S3993XA Unspecified injury of pelvis, initial encounter: Secondary | ICD-10-CM | POA: Diagnosis not present

## 2022-12-18 DIAGNOSIS — M25511 Pain in right shoulder: Secondary | ICD-10-CM | POA: Diagnosis not present

## 2022-12-18 DIAGNOSIS — S199XXA Unspecified injury of neck, initial encounter: Secondary | ICD-10-CM | POA: Diagnosis not present

## 2022-12-18 DIAGNOSIS — S299XXA Unspecified injury of thorax, initial encounter: Secondary | ICD-10-CM | POA: Diagnosis not present

## 2022-12-18 DIAGNOSIS — I517 Cardiomegaly: Secondary | ICD-10-CM | POA: Diagnosis not present

## 2022-12-18 DIAGNOSIS — R0781 Pleurodynia: Secondary | ICD-10-CM | POA: Diagnosis not present

## 2022-12-18 DIAGNOSIS — Z043 Encounter for examination and observation following other accident: Secondary | ICD-10-CM | POA: Diagnosis not present

## 2022-12-18 DIAGNOSIS — S4981XA Other specified injuries of right shoulder and upper arm, initial encounter: Secondary | ICD-10-CM | POA: Diagnosis not present

## 2022-12-18 DIAGNOSIS — S4991XA Unspecified injury of right shoulder and upper arm, initial encounter: Secondary | ICD-10-CM | POA: Diagnosis not present

## 2022-12-18 LAB — I-STAT CHEM 8, ED
BUN: 27 mg/dL — ABNORMAL HIGH (ref 6–20)
Calcium, Ion: 1 mmol/L — ABNORMAL LOW (ref 1.15–1.40)
Chloride: 110 mmol/L (ref 98–111)
Creatinine, Ser: 0.6 mg/dL (ref 0.44–1.00)
Glucose, Bld: 124 mg/dL — ABNORMAL HIGH (ref 70–99)
HCT: 37 % (ref 36.0–46.0)
Hemoglobin: 12.6 g/dL (ref 12.0–15.0)
Potassium: 3.5 mmol/L (ref 3.5–5.1)
Sodium: 142 mmol/L (ref 135–145)
TCO2: 23 mmol/L (ref 22–32)

## 2022-12-18 LAB — CBC
HCT: 39.2 % (ref 36.0–46.0)
Hemoglobin: 12.7 g/dL (ref 12.0–15.0)
MCH: 27.9 pg (ref 26.0–34.0)
MCHC: 32.4 g/dL (ref 30.0–36.0)
MCV: 86 fL (ref 80.0–100.0)
Platelets: 290 10*3/uL (ref 150–400)
RBC: 4.56 MIL/uL (ref 3.87–5.11)
RDW: 16.3 % — ABNORMAL HIGH (ref 11.5–15.5)
WBC: 13.5 10*3/uL — ABNORMAL HIGH (ref 4.0–10.5)
nRBC: 0 % (ref 0.0–0.2)

## 2022-12-18 LAB — COMPREHENSIVE METABOLIC PANEL
ALT: 51 U/L — ABNORMAL HIGH (ref 0–44)
AST: 83 U/L — ABNORMAL HIGH (ref 15–41)
Albumin: 4 g/dL (ref 3.5–5.0)
Alkaline Phosphatase: 94 U/L (ref 38–126)
Anion gap: 11 (ref 5–15)
BUN: 24 mg/dL — ABNORMAL HIGH (ref 6–20)
CO2: 20 mmol/L — ABNORMAL LOW (ref 22–32)
Calcium: 8.7 mg/dL — ABNORMAL LOW (ref 8.9–10.3)
Chloride: 108 mmol/L (ref 98–111)
Creatinine, Ser: 0.75 mg/dL (ref 0.44–1.00)
GFR, Estimated: >60 mL/min (ref >60)
Glucose, Bld: 118 mg/dL — ABNORMAL HIGH (ref 70–99)
Potassium: 3 mmol/L — ABNORMAL LOW (ref 3.5–5.1)
Sodium: 139 mmol/L (ref 135–145)
Total Bilirubin: 1.1 mg/dL (ref 0.3–1.2)
Total Protein: 6.8 g/dL (ref 6.5–8.1)

## 2022-12-18 LAB — URINALYSIS, ROUTINE W REFLEX MICROSCOPIC
Bilirubin Urine: NEGATIVE
Glucose, UA: NEGATIVE mg/dL
Hgb urine dipstick: NEGATIVE
Ketones, ur: 20 mg/dL — AB
Leukocytes,Ua: NEGATIVE
Nitrite: NEGATIVE
Protein, ur: NEGATIVE mg/dL
Specific Gravity, Urine: 1.02 (ref 1.005–1.030)
pH: 5 (ref 5.0–8.0)

## 2022-12-18 MED ORDER — POTASSIUM CHLORIDE CRYS ER 20 MEQ PO TBCR
40.0000 meq | EXTENDED_RELEASE_TABLET | Freq: Once | ORAL | Status: AC
  Administered 2022-12-18: 40 meq via ORAL
  Filled 2022-12-18: qty 2

## 2022-12-18 MED ORDER — METHOCARBAMOL 500 MG PO TABS
1000.0000 mg | ORAL_TABLET | Freq: Once | ORAL | Status: AC
  Administered 2022-12-18: 1000 mg via ORAL
  Filled 2022-12-18: qty 2

## 2022-12-18 MED ORDER — HYDROMORPHONE HCL 1 MG/ML IJ SOLN
1.0000 mg | Freq: Once | INTRAMUSCULAR | Status: AC
  Administered 2022-12-18: 1 mg via INTRAVENOUS
  Filled 2022-12-18: qty 1

## 2022-12-18 MED ORDER — ONDANSETRON HCL 4 MG/2ML IJ SOLN
4.0000 mg | Freq: Once | INTRAMUSCULAR | Status: AC
  Administered 2022-12-18: 4 mg via INTRAVENOUS
  Filled 2022-12-18: qty 2

## 2022-12-18 MED ORDER — OXYCODONE-ACETAMINOPHEN 5-325 MG PO TABS: 1.0000 | ORAL_TABLET | Freq: Four times a day (QID) | ORAL | 0 refills | Status: AC | PRN

## 2022-12-18 MED ORDER — METHOCARBAMOL 500 MG PO TABS: 1000.0000 mg | ORAL_TABLET | Freq: Three times a day (TID) | ORAL | 0 refills | Status: DC | PRN

## 2022-12-18 MED ORDER — ONDANSETRON 4 MG PO TBDP: 4.0000 mg | ORAL_TABLET | Freq: Three times a day (TID) | ORAL | 0 refills | Status: DC | PRN

## 2022-12-18 MED ORDER — OXYCODONE-ACETAMINOPHEN 5-325 MG PO TABS
1.0000 | ORAL_TABLET | Freq: Once | ORAL | Status: AC
  Administered 2022-12-18: 1 via ORAL
  Filled 2022-12-18: qty 1

## 2022-12-18 MED ORDER — KETOROLAC TROMETHAMINE 15 MG/ML IJ SOLN
15.0000 mg | Freq: Once | INTRAMUSCULAR | Status: AC
  Administered 2022-12-18: 15 mg via INTRAVENOUS
  Filled 2022-12-18: qty 1

## 2022-12-18 MED ORDER — IOHEXOL 300 MG/ML  SOLN
100.0000 mL | Freq: Once | INTRAMUSCULAR | Status: AC | PRN
  Administered 2022-12-18: 100 mL via INTRAVENOUS

## 2022-12-18 NOTE — ED Triage Notes (Signed)
Pt fell of a horse this am. Describes as more of a tumble, she was not trampled. C/o right sided pain down to rib/ stomach area. No lower extremities are hurting at this time

## 2022-12-18 NOTE — Discharge Instructions (Addendum)
Your imaging studies did not show any new injuries.  Expect continued soreness, likely worse tomorrow.  Wear sling as needed for comfort.  Prescriptions for narcotic pain medication and muscle relaxer was sent to your pharmacy.  Take as needed.  Take MiraLAX for treatment of constipation.  This is available over-the-counter.  Follow-up with your orthopedic doctor as needed for persistent joint pain or decreased function.  Return to the emergency department for any new or worsening symptoms of concern.

## 2022-12-18 NOTE — ED Provider Notes (Signed)
[place] EMERGENCY DEPARTMENT AT [facility] Provider Note   CSN: [id number] Arrival date & time: 12/18/22  1119     History  Chief Complaint  Patient presents with   fell of horse    [name] is a 52 y.o. female.  HPI Patient presents for fall.  Medical history includes anxiety, depression, HTN.  Patient was in her normal state of health earlier today.  Shortly prior to arrival, she fell off of a horse.  Estimated height of fall was 5 feet.  She fell in the dark, landing on her right side.  She has since had pain in areas of right side of neck, posterior right shoulder, and right-sided chest wall.  She endorses some paresthesias to her right hand.  She received 6 mg of morphine prior to arrival.  She was ambulatory with EMS.  She is not on blood thinners.    Home Medications Prior to Admission medications   Medication Sig Start Date End Date Taking? Authorizing Provider  methocarbamol (ROBAXIN) 500 MG tablet Take 2 tablets (1,000 mg total) by mouth every 8 (eight) hours as needed for muscle spasms. 12/18/22  Yes Gloris Manchester, MD  ondansetron (ZOFRAN-ODT) 4 MG disintegrating tablet Take 1 tablet (4 mg total) by mouth every 8 (eight) hours as needed for nausea or vomiting. 12/18/22  Yes Gloris Manchester, MD  oxyCODONE-acetaminophen (PERCOCET/ROXICET) 5-325 MG tablet Take 1 tablet by mouth every 6 (six) hours as needed for up to 3 days for severe pain. 12/18/22 12/21/22 Yes Gloris Manchester, MD  lisinopril (ZESTRIL) 5 MG tablet Take 1 tablet (5 mg total) by mouth daily. 11/11/22   Berniece Salines, FNP  omeprazole (PRILOSEC) 40 MG capsule Take 1 capsule (40 mg total) by mouth daily. 07/26/22   [name], MD  traZODone (DESYREL) 50 MG tablet TAKE 1/2 TO 1 TABLET(25 TO 50 MG) BY MOUTH AT BEDTIME AS NEEDED FOR SLEEP 05/04/22   Berniece Salines, FNP      Allergies    Patient has no known allergies.    Review of Systems   Review of Systems  Cardiovascular:  Positive for chest pain.   Musculoskeletal:  Positive for arthralgias, back pain and neck pain.  All other systems reviewed and are negative.   Physical Exam Updated Vital Signs BP 134/81   Pulse (!) 59   Temp 98.1 F (36.7 C) (Oral)   Resp 14   Ht 5' 5.5" (1.664 m)   Wt 74.8 kg   SpO2 97%   BMI 27.04 kg/m  Physical Exam Vitals and nursing note reviewed.  Constitutional:      General: She is not in acute distress.    Appearance: Normal appearance. She is well-developed. She is not ill-appearing, toxic-appearing or diaphoretic.  HENT:     Head: Normocephalic and atraumatic.     Right Ear: External ear normal.     Left Ear: External ear normal.     Nose: Nose normal.     Mouth/Throat:     Mouth: Mucous membranes are moist.  Eyes:     Extraocular Movements: Extraocular movements intact.     Conjunctiva/sclera: Conjunctivae normal.  Neck:     Comments: Cervical collar on Cardiovascular:     Rate and Rhythm: Normal rate and regular rhythm.     Heart sounds: No murmur heard. Pulmonary:     Effort: Pulmonary effort is normal. No respiratory distress.     Breath sounds: Normal breath sounds. No wheezing or rales.  Chest:     Chest wall: Tenderness present.  Abdominal:     General: There is no distension.     Palpations: Abdomen is soft.     Tenderness: There is no abdominal tenderness.  Musculoskeletal:        General: Tenderness present. No swelling or deformity.     Cervical back: Neck supple.  Skin:    General: Skin is warm and dry.     Coloration: Skin is not jaundiced or pale.  Neurological:     General: No focal deficit present.     Mental Status: She is alert and oriented to person, place, and time.     Cranial Nerves: No cranial nerve deficit.     Sensory: No sensory deficit.     Motor: No weakness.     Coordination: Coordination normal.  Psychiatric:        Mood and Affect: Mood normal.        Behavior: Behavior normal.        Thought Content: Thought content normal.         Judgment: Judgment normal.     ED Results / Procedures / Treatments   Labs (all labs ordered are listed, but only abnormal results are displayed) Labs Reviewed  COMPREHENSIVE METABOLIC PANEL - Abnormal; Notable for the following components:      Result Value   Potassium 3.0 (*)    CO2 20 (*)    Glucose, Bld 118 (*)    BUN 24 (*)    Calcium 8.7 (*)    AST 83 (*)    ALT 51 (*)    All other components within normal limits  CBC - Abnormal; Notable for the following components:   WBC 13.5 (*)    RDW 16.3 (*)    All other components within normal limits  URINALYSIS, ROUTINE W REFLEX MICROSCOPIC - Abnormal; Notable for the following components:   APPearance HAZY (*)    Ketones, ur 20 (*)    All other components within normal limits  I-STAT CHEM 8, ED - Abnormal; Notable for the following components:   BUN 27 (*)    Glucose, Bld 124 (*)    Calcium, Ion 1.00 (*)    All other components within normal limits    EKG EKG Interpretation Date/Time:  Sunday December 18 2022 11:58:47 EDT Ventricular Rate:  56 PR Interval:  154 QRS Duration:  88 QT Interval:  440 QTC Calculation: 425 R Axis:   58  Text Interpretation: Sinus rhythm Probable left atrial enlargement Confirmed by Gloris Manchester ([id number]) on 12/18/2022 12:25:05 PM  Radiology CT T-SPINE NO CHARGE  Result Date: 12/18/2022 CLINICAL DATA:  Trauma, fall from horse EXAM: CT CHEST, ABDOMEN AND PELVIS WITH CONTRAST CT THORACIC AND LUMBAR SPINE WITH CONTRAST TECHNIQUE: Multidetector CT images of the chest, abdomen, and pelvis were obtained using the standard protocol following the administration of intravenous contrast. Multidetector CT images of the thoracic lumbar spine were obtained using the standard protocol following the administration of intravenous contrast. RADIATION DOSE REDUCTION: This exam was performed according to the departmental dose-optimization program which includes automated exposure control, adjustment of the mA and/or kV  according to patient size and/or use of iterative reconstruction technique. CONTRAST:  100 mL Omnipaque 300 iodinated contrast IV COMPARISON:  None Available. FINDINGS: CT CHEST FINDINGS Cardiovascular: Aortic atherosclerosis. Normal heart size. No pericardial effusion. Mediastinum/Nodes: No enlarged mediastinal, hilar, or axillary lymph nodes. Thymic remnant in the anterior mediastinum. Small hiatal hernia. Thyroid gland, trachea,  and esophagus demonstrate no significant findings. Lungs/Pleura: Lungs are clear. No pleural effusion or pneumothorax. Musculoskeletal: No chest wall mass or suspicious osseous lesions identified. CT ABDOMEN PELVIS FINDINGS Hepatobiliary: No focal liver abnormality is seen. Status post cholecystectomy. Postoperative biliary dilatation. Pancreas: Unremarkable. No pancreatic ductal dilatation or surrounding inflammatory changes. Spleen: Normal in size without significant abnormality. Adrenals/Urinary Tract: Adrenal glands are unremarkable. Kidneys are normal, without renal calculi, solid lesion, or hydronephrosis. Bladder is unremarkable. Stomach/Bowel: Status post Roux type gastric bypass. Status post appendectomy. No evidence of bowel wall thickening, distention, or inflammatory changes. Large burden of stool throughout the colon and rectum. Vascular/Lymphatic: No significant vascular findings are present. No enlarged abdominal or pelvic lymph nodes. Reproductive: No mass or other abnormality. Other: No abdominal wall hernia or abnormality. No ascites. Musculoskeletal: No acute osseous findings. CT THORACIC AND LUMBAR SPINE FINDINGS Alignment: Normal thoracic kyphosis. Greater than 25% chronic degenerative anterolisthesis of L5 on S1. Otherwise normal lumbar lordosis. 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 No acute fracture. Disc spaces: Mild multilevel disc space height loss and osteophytosis throughout the  thoracic spine. Focally severe disc space height loss and osteophytosis of L5-S1 with otherwise preserved lumbar and lumbar disc spaces. Paraspinous soft tissues: Unremarkable. IMPRESSION: 1. No acute traumatic injury to the chest, abdomen, or pelvis. 2. No acute fracture or static subluxation of the thoracic or lumbar spine. 3. 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 4. Large burden of stool throughout the colon and rectum. 5. Status post Roux type gastric bypass, cholecystectomy, and appendectomy. Aortic Atherosclerosis (ICD10-I70.0). Electronically Signed   By: Jearld Lesch M.D.   On: 12/18/2022 13:59   CT HEAD WO CONTRAST  Result Date: 12/18/2022 CLINICAL DATA:  Fall from horse EXAM: CT HEAD WITHOUT CONTRAST CT CERVICAL SPINE WITHOUT CONTRAST TECHNIQUE: Multidetector CT imaging of the head and cervical spine was performed following the standard protocol without intravenous contrast. Multiplanar CT image reconstructions of the cervical spine were also generated. RADIATION DOSE REDUCTION: This exam was performed according to the departmental dose-optimization program which includes automated exposure control, adjustment of the mA and/or kV according to patient size and/or use of iterative reconstruction technique. COMPARISON:  11/16/2018 FINDINGS: CT HEAD FINDINGS Brain: No evidence of acute infarction, hemorrhage, hydrocephalus, extra-axial collection or mass lesion/mass effect. Vascular: No hyperdense vessel or unexpected calcification. Skull: Normal. Negative for fracture or focal lesion. Sinuses/Orbits: No acute finding. Other: None. CT CERVICAL SPINE FINDINGS Alignment: Normal. Skull base and vertebrae: No acute fracture. No primary bone lesion or focal pathologic process. Soft tissues and spinal canal: No prevertebral fluid or swelling. No visible canal hematoma. Disc levels: Congenital fusion of C2-C3. Minimal multilevel disc degenerative change. Upper chest:  Negative. Other: None. IMPRESSION: 1. No acute intracranial pathology. 2. No fracture or static subluxation of the cervical spine. 3. Congenital fusion of C2-C3. Minimal multilevel disc degenerative change. Electronically Signed   By: Jearld Lesch M.D.   On: 12/18/2022 13:51   CT CERVICAL SPINE WO CONTRAST  Result Date: 12/18/2022 CLINICAL DATA:  Fall from horse EXAM: CT HEAD WITHOUT CONTRAST CT CERVICAL SPINE WITHOUT CONTRAST TECHNIQUE: Multidetector CT imaging of the head and cervical spine was performed following the standard protocol without intravenous contrast. Multiplanar CT image reconstructions of the cervical spine were also generated. RADIATION DOSE REDUCTION: This exam was performed according to the departmental dose-optimization program which includes automated exposure control, adjustment of  the mA and/or kV according to patient size and/or use of iterative reconstruction technique. COMPARISON:  11/16/2018 FINDINGS: CT HEAD FINDINGS Brain: No evidence of acute infarction, hemorrhage, hydrocephalus, extra-axial collection or mass lesion/mass effect. Vascular: No hyperdense vessel or unexpected calcification. Skull: Normal. Negative for fracture or focal lesion. Sinuses/Orbits: No acute finding. Other: None. CT CERVICAL SPINE FINDINGS Alignment: Normal. Skull base and vertebrae: No acute fracture. No primary bone lesion or focal pathologic process. Soft tissues and spinal canal: No prevertebral fluid or swelling. No visible canal hematoma. Disc levels: Congenital fusion of C2-C3. Minimal multilevel disc degenerative change. Upper chest: Negative. Other: None. IMPRESSION: 1. No acute intracranial pathology. 2. No fracture or static subluxation of the cervical spine. 3. Congenital fusion of C2-C3. Minimal multilevel disc degenerative change. Electronically Signed   By: Jearld Lesch M.D.   On: 12/18/2022 13:51    Procedures Procedures    Medications Ordered in ED Medications  ondansetron  (ZOFRAN) injection 4 mg (has no administration in time range)  potassium chloride SA (KLOR-CON M) CR tablet 40 mEq (has no administration in time range)  oxyCODONE-acetaminophen (PERCOCET/ROXICET) 5-325 MG per tablet 1 tablet (has no administration in time range)  HYDROmorphone (DILAUDID) injection 1 mg (1 mg Intravenous Given 12/18/22 1208)  iohexol (OMNIPAQUE) 300 MG/ML solution 100 mL (100 mLs Intravenous Contrast Given 12/18/22 1312)  ketorolac (TORADOL) 15 MG/ML injection 15 mg (15 mg Intravenous Given 12/18/22 1420)  methocarbamol (ROBAXIN) tablet 1,000 mg (1,000 mg Oral Given 12/18/22 1423)    ED Course/ Medical Decision Making/ A&P                             Medical Decision Making Amount and/or Complexity of Data Reviewed Labs: ordered. Radiology: ordered. ECG/medicine tests: ordered.  Risk Prescription drug management.   This patient presents to the ED for concern of fall, this involves an extensive number of treatment options, and is a complaint that carries with it a high risk of complications and morbidity.  The differential diagnosis includes acute injuries   Co morbidities that complicate the patient evaluation  anxiety, depression, HTN, prior gastric bypass surgery   Additional history obtained:  Additional history obtained from EMS External records from outside source obtained and reviewed including EMR   Lab Tests:  I Ordered, and personally interpreted labs.  The pertinent results include: Leukocytosis is present.  Hemoglobin is normal.  There is mild elevation in transaminases.  Potassium is low.  Kidney function is normal with no evidence of hematuria on urinalysis.   Imaging Studies ordered:  I ordered imaging studies including x-ray of chest, pelvis, right shoulder; CT imaging of head, cervical spine, chest, abdomen, T-spine, L-spine I independently visualized and interpreted imaging which showed no acute injuries, stool burden is present. I agree  with the radiologist interpretation   Cardiac Monitoring: / EKG:  The patient was maintained on a cardiac monitor.  I personally viewed and interpreted the cardiac monitored which showed an underlying rhythm of: Sinus rhythm  Problem List / ED Course / Critical interventions / Medication management  Patient resents after a fall off of a horse.  On arrival in the ED, she is alert and oriented.  Cervical collar is in place.  Bilateral breath sounds are present.  She has tenderness to right upper chest wall and posterior right shoulder.  Distal right arm is neurovascularly intact.  She does endorse some paresthesias in right hand.  She has had  improved pain following 6 mg of morphine that was given prior to arrival.  Workup was initiated.  While in the ED, patient had recurrence of musculoskeletal pain in addition to onset of abdominal pain.  Dilaudid was ordered for analgesia.  FAST exam was negative.  She underwent CT imaging which did not show any acute injuries.  A large stool burden was noted.  She was informed of these results.  Toradol and Robaxin were ordered for ongoing analgesia.  Zofran was given for nausea.  Cervical collar was cleared.  Patient was discharged in stable condition. I ordered medication including Dilaudid, Toradol, Robaxin, Percocet for analgesia; Zofran for nausea: Potassium chloride for hypokalemia Reevaluation of the patient after these medicines showed that the patient improved I have reviewed the patients home medicines and have made adjustments as needed   Social Determinants of Health:  Has PCP        Final Clinical Impression(s) / ED Diagnoses Final diagnoses:  Fall from horse, initial encounter    Rx / DC Orders ED Discharge Orders          Ordered    oxyCODONE-acetaminophen (PERCOCET/ROXICET) 5-325 MG tablet  Every 6 hours PRN        12/18/22 1523    methocarbamol (ROBAXIN) 500 MG tablet  Every 8 hours PRN        12/18/22 1523    ondansetron  (ZOFRAN-ODT) 4 MG disintegrating tablet  Every 8 hours PRN        12/18/22 1524              Gloris Manchester, MD 12/18/22 1524

## 2022-12-19 NOTE — Telephone Encounter (Signed)
Quote sent to patient via mychart with direction to reach out if she wishes to schedule surgery.

## 2022-12-19 NOTE — Telephone Encounter (Signed)
Patients insurance does not cover CPT 15734 & [phone] for Revision Abdominoplasty. Patient was made aware of this and sent an estimate for the surgery. She replied via MyChart with:  "I didn't ask for abdominplasty. I asked for what insurance covers."  The CPT codes above were provided by our Coder Lisa H. Explained to the patient that we must follow the CPT codes advised by our coding team which are in compliance with CMS guidelines.   Previously patient asked that we bill different codes that her plan does cover:   "I think the that code for Lipectomy would also be appropriate, to correct previous surgery. CPT codes 60454 or [phone] with ICD 10 [id number] and [id number]"  At that time again explained to patient that we can't pick ad choose our codes based on what insurance covers but rather we follow our Codes who make sure we are billing within CMS guidelines to remain compliant.  Today, advised patient to call and schedule a follow-up with Dr. Ladona Ridgel for further discussion.

## 2022-12-24 ENCOUNTER — Emergency Department: Payer: BC Managed Care – PPO

## 2022-12-24 ENCOUNTER — Other Ambulatory Visit: Payer: Self-pay

## 2022-12-24 ENCOUNTER — Emergency Department
Admission: EM | Admit: 2022-12-24 | Discharge: 2022-12-24 | Disposition: A | Discharge status: Home / Self Care | Payer: BC Managed Care – PPO | Attending: Emergency Medicine | Admitting: Emergency Medicine

## 2022-12-24 DIAGNOSIS — S3991XA Unspecified injury of abdomen, initial encounter: Secondary | ICD-10-CM | POA: Diagnosis not present

## 2022-12-24 DIAGNOSIS — I10 Essential (primary) hypertension: Secondary | ICD-10-CM | POA: Diagnosis not present

## 2022-12-24 DIAGNOSIS — K118 Other diseases of salivary glands: Secondary | ICD-10-CM | POA: Diagnosis not present

## 2022-12-24 DIAGNOSIS — W1789XA Other fall from one level to another, initial encounter: Secondary | ICD-10-CM | POA: Diagnosis not present

## 2022-12-24 DIAGNOSIS — T07XXXA Unspecified multiple injuries, initial encounter: Secondary | ICD-10-CM

## 2022-12-24 DIAGNOSIS — S3993XA Unspecified injury of pelvis, initial encounter: Secondary | ICD-10-CM | POA: Diagnosis not present

## 2022-12-24 DIAGNOSIS — M4312 Spondylolisthesis, cervical region: Secondary | ICD-10-CM | POA: Diagnosis not present

## 2022-12-24 DIAGNOSIS — E041 Nontoxic single thyroid nodule: Secondary | ICD-10-CM | POA: Diagnosis not present

## 2022-12-24 DIAGNOSIS — M47812 Spondylosis without myelopathy or radiculopathy, cervical region: Secondary | ICD-10-CM | POA: Diagnosis not present

## 2022-12-24 DIAGNOSIS — S301XXD Contusion of abdominal wall, subsequent encounter: Secondary | ICD-10-CM | POA: Diagnosis not present

## 2022-12-24 DIAGNOSIS — S3991XD Unspecified injury of abdomen, subsequent encounter: Secondary | ICD-10-CM

## 2022-12-24 LAB — COMPREHENSIVE METABOLIC PANEL
ALT: 50 U/L — ABNORMAL HIGH (ref 0–44)
AST: 24 U/L (ref 15–41)
Albumin: 4.1 g/dL (ref 3.5–5.0)
Alkaline Phosphatase: 117 U/L (ref 38–126)
Anion gap: 8 (ref 5–15)
BUN: 12 mg/dL (ref 6–20)
CO2: 27 mmol/L (ref 22–32)
Calcium: 8.7 mg/dL — ABNORMAL LOW (ref 8.9–10.3)
Chloride: 105 mmol/L (ref 98–111)
Creatinine, Ser: 0.61 mg/dL (ref 0.44–1.00)
GFR, Estimated: >60 mL/min (ref >60)
Glucose, Bld: 92 mg/dL (ref 70–99)
Potassium: 3.6 mmol/L (ref 3.5–5.1)
Sodium: 140 mmol/L (ref 135–145)
Total Bilirubin: 0.8 mg/dL (ref 0.3–1.2)
Total Protein: 7.2 g/dL (ref 6.5–8.1)

## 2022-12-24 LAB — CBC WITH DIFFERENTIAL/PLATELET
Abs Immature Granulocytes: 0.01 10*3/uL (ref 0.00–0.07)
Basophils Absolute: 0 10*3/uL (ref 0.0–0.1)
Basophils Relative: 0 %
Eosinophils Absolute: 0.2 10*3/uL (ref 0.0–0.5)
Eosinophils Relative: 4 %
HCT: 42.6 % (ref 36.0–46.0)
Hemoglobin: 13.6 g/dL (ref 12.0–15.0)
Immature Granulocytes: 0 %
Lymphocytes Relative: 28 %
Lymphs Abs: 1.6 10*3/uL (ref 0.7–4.0)
MCH: 27.5 pg (ref 26.0–34.0)
MCHC: 31.9 g/dL (ref 30.0–36.0)
MCV: 86.2 fL (ref 80.0–100.0)
Monocytes Absolute: 0.3 10*3/uL (ref 0.1–1.0)
Monocytes Relative: 5 %
Neutro Abs: 3.4 10*3/uL (ref 1.7–7.7)
Neutrophils Relative %: 63 %
Platelets: 262 10*3/uL (ref 150–400)
RBC: 4.94 MIL/uL (ref 3.87–5.11)
RDW: 15.3 % (ref 11.5–15.5)
WBC: 5.5 10*3/uL (ref 4.0–10.5)
nRBC: 0 % (ref 0.0–0.2)

## 2022-12-24 LAB — URINALYSIS, ROUTINE W REFLEX MICROSCOPIC
Bilirubin Urine: NEGATIVE
Glucose, UA: NEGATIVE mg/dL
Hgb urine dipstick: NEGATIVE
Ketones, ur: NEGATIVE mg/dL
Leukocytes,Ua: NEGATIVE
Nitrite: NEGATIVE
Protein, ur: NEGATIVE mg/dL
Specific Gravity, Urine: 1.014 (ref 1.005–1.030)
pH: 6 (ref 5.0–8.0)

## 2022-12-24 MED ORDER — MORPHINE SULFATE (PF) 4 MG/ML IV SOLN: 4.0000 mg | Freq: Once | INTRAVENOUS | Status: DC

## 2022-12-24 MED ORDER — OXYCODONE-ACETAMINOPHEN 5-325 MG PO TABS: 1.0000 | ORAL_TABLET | ORAL | 0 refills | Status: DC | PRN

## 2022-12-24 MED ORDER — BACLOFEN 10 MG PO TABS: 10.0000 mg | ORAL_TABLET | Freq: Three times a day (TID) | ORAL | 0 refills | Status: AC

## 2022-12-24 MED ORDER — SODIUM CHLORIDE 0.9 % IV BOLUS
1000.0000 mL | Freq: Once | INTRAVENOUS | Status: AC
  Administered 2022-12-24: 1000 mL via INTRAVENOUS

## 2022-12-24 MED ORDER — ONDANSETRON HCL 4 MG/2ML IJ SOLN: 4.0000 mg | Freq: Once | INTRAMUSCULAR | Status: DC

## 2022-12-24 MED ORDER — IOHEXOL 300 MG/ML  SOLN
100.0000 mL | Freq: Once | INTRAMUSCULAR | Status: AC | PRN
  Administered 2022-12-24: 100 mL via INTRAVENOUS

## 2022-12-24 NOTE — ED Triage Notes (Signed)
Pt reports was bucked off her horse last Sunday and had pain to back, head, face and neck. Pt reports still having pain to her right collarbone, neck, back and reports that it hurts to swallow. Pt reports that they did CT scans and x-rays and all was fine but she reports is no better and the pain is spreading across her body.

## 2022-12-24 NOTE — ED Provider Notes (Signed)
[facility] Provider Note    Event Date/Time   First MD Initiated Contact with Patient 12/24/22 1514     (approximate)   History   Neck Injury   HPI  [name] is a 52 y.o. female with history of hypertension, bowel obstruction, anxiety and depression presents emergency department with continued pain from a fall from a 16-1/2 hand horse.  Patient was seen at [facility] 6 days ago for the original injury.  Patient states they did multiple CTs and x-rays.  States has had painful swallowing since the incident.  The fall did break her helmet.  Denies numbness or tingling.  Has continued flank pain which is worsened and moved to the left side instead of the right from the original evaluation.  Has not noticed blood in her urine.  However patient has had a flight to [place] for a funeral since the injury.  Denies chest pain or shortness of breath      Physical Exam   Triage Vital Signs: ED Triage Vitals  Encounter Vitals Group     BP 12/24/22 1347 (!) 163/94     Systolic BP Percentile --      Diastolic BP Percentile --      Pulse Rate 12/24/22 1347 (!) 59     Resp 12/24/22 1347 20     Temp 12/24/22 1347 98.1 F (36.7 C)     Temp Source 12/24/22 1347 Oral     SpO2 12/24/22 1347 100 %     Weight 12/24/22 1346 165 lb (74.8 kg)     Height 12/24/22 1346 5\' 5"  (1.651 m)     Head Circumference --      Peak Flow --      Pain Score 12/24/22 1346 6     Pain Loc --      Pain Education --      Exclude from Growth Chart --     Most recent vital signs: Vitals:   12/24/22 1347 12/24/22 1805  BP: (!) 163/94 (!) 158/88  Pulse: (!) 59 60  Resp: 20 18  Temp: 98.1 F (36.7 C) 98.1 F (36.7 C)  SpO2: 100% 100%     General: Awake, no distress.   CV:  Good peripheral perfusion. regular rate and  rhythm Resp:  Normal effort. Lungs cta Abd:  No distention.  Mildly tender in the left upper quadrant, positive CVA tenderness, bowel sounds  normal Other:  C-spine still tender to palpation, patient has obvious painful swallowing.  Grips equal bilaterally   ED Results / Procedures / Treatments   Labs (all labs ordered are listed, but only abnormal results are displayed) Labs Reviewed  COMPREHENSIVE METABOLIC PANEL - Abnormal; Notable for the following components:      Result Value   Calcium 8.7 (*)    ALT 50 (*)    All other components within normal limits  URINALYSIS, ROUTINE W REFLEX MICROSCOPIC - Abnormal; Notable for the following components:   Color, Urine YELLOW (*)    APPearance CLEAR (*)    All other components within normal limits  CBC WITH DIFFERENTIAL/PLATELET     EKG     RADIOLOGY CT soft tissue of the neck, CT abdomen pelvis with IV contrast    PROCEDURES:   Procedures   MEDICATIONS ORDERED IN ED: Medications  sodium chloride 0.9 % bolus 1,000 mL (0 mLs Intravenous Stopped 12/24/22 1805)  iohexol (OMNIPAQUE) 300 MG/ML solution 100 mL (100 mLs Intravenous Contrast Given 12/24/22  1652)     IMPRESSION / MDM / ASSESSMENT AND PLAN / ED COURSE  I reviewed the triage vital signs and the nursing notes.                              Differential diagnosis includes, but is not limited to, cervical fracture, dysphagia, splenic laceration, kidney laceration  Patient's presentation is most consistent with acute presentation with potential threat to life or bodily function.   CTs ordered, will progress to MRI of C-spine if CT negative Labs ordered Offered patient pain medication.  She does not want pain medication at this time as she will need to drive home  CT soft tissue of the neck and abdomen/pelvis independently reviewed and interpreted by me be of the radiology report as being negative for any acute abnormality.  I did offer to do an MRI of the C-spine for the patient.  States is not that difficult to swallow feels that he just may be minor swelling from the incident.  I did give her strict  instructions to return the emergency department or see her doctor if worsening.  Did explain to her that an MRI would show if the cord had compression or swelling.  She is comfortable with the pain medication and muscle relaxer.  States she would just like to follow-up with her doctor.  Discharged in stable condition.   FINAL CLINICAL IMPRESSION(S) / ED DIAGNOSES   Final diagnoses:  Multiple contusions  Blunt trauma to abdomen, subsequent encounter     Rx / DC Orders   ED Discharge Orders          Ordered    oxyCODONE-acetaminophen (PERCOCET) 5-325 MG tablet  Every 4 hours PRN        12/24/22 1741    baclofen (LIORESAL) 10 MG tablet  3 times daily        12/24/22 1741             Note:  This document was prepared using Dragon voice recognition software and may include unintentional dictation errors.    Faythe Ghee, PA-C 12/24/22 2028    Phineas Semen, MD 12/25/22 [id number]

## 2023-01-09 ENCOUNTER — Inpatient Hospital Stay: Payer: BC Managed Care – PPO | Attending: Internal Medicine

## 2023-01-09 ENCOUNTER — Inpatient Hospital Stay: Payer: BC Managed Care – PPO

## 2023-01-09 DIAGNOSIS — E538 Deficiency of other specified B group vitamins: Secondary | ICD-10-CM | POA: Diagnosis not present

## 2023-01-09 DIAGNOSIS — E559 Vitamin D deficiency, unspecified: Secondary | ICD-10-CM | POA: Diagnosis not present

## 2023-01-09 DIAGNOSIS — Z9884 Bariatric surgery status: Secondary | ICD-10-CM | POA: Diagnosis not present

## 2023-01-09 DIAGNOSIS — D509 Iron deficiency anemia, unspecified: Secondary | ICD-10-CM | POA: Insufficient documentation

## 2023-01-09 DIAGNOSIS — D649 Anemia, unspecified: Secondary | ICD-10-CM

## 2023-01-09 LAB — LACTATE DEHYDROGENASE: LDH: 141 U/L (ref 98–192)

## 2023-01-09 LAB — CBC WITH DIFFERENTIAL (CANCER CENTER ONLY)
Abs Immature Granulocytes: 0.05 10*3/uL (ref 0.00–0.07)
Basophils Absolute: 0 10*3/uL (ref 0.0–0.1)
Basophils Relative: 0 %
Eosinophils Absolute: 0.2 10*3/uL (ref 0.0–0.5)
Eosinophils Relative: 4 %
HCT: 40.7 % (ref 36.0–46.0)
Hemoglobin: 13.3 g/dL (ref 12.0–15.0)
Immature Granulocytes: 1 %
Lymphocytes Relative: 31 %
Lymphs Abs: 1.7 10*3/uL (ref 0.7–4.0)
MCH: 28.2 pg (ref 26.0–34.0)
MCHC: 32.7 g/dL (ref 30.0–36.0)
MCV: 86.4 fL (ref 80.0–100.0)
Monocytes Absolute: 0.2 10*3/uL (ref 0.1–1.0)
Monocytes Relative: 4 %
Neutro Abs: 3.2 10*3/uL (ref 1.7–7.7)
Neutrophils Relative %: 60 %
Platelet Count: 292 10*3/uL (ref 150–400)
RBC: 4.71 MIL/uL (ref 3.87–5.11)
RDW: 15.1 % (ref 11.5–15.5)
WBC Count: 5.5 10*3/uL (ref 4.0–10.5)
nRBC: 0 % (ref 0.0–0.2)

## 2023-01-09 LAB — BASIC METABOLIC PANEL
Anion gap: 11 (ref 5–15)
BUN: 21 mg/dL — ABNORMAL HIGH (ref 6–20)
CO2: 22 mmol/L (ref 22–32)
Calcium: 8.9 mg/dL (ref 8.9–10.3)
Chloride: 106 mmol/L (ref 98–111)
Creatinine, Ser: 0.74 mg/dL (ref 0.44–1.00)
GFR, Estimated: >60 mL/min (ref >60)
Glucose, Bld: 122 mg/dL — ABNORMAL HIGH (ref 70–99)
Potassium: 3.6 mmol/L (ref 3.5–5.1)
Sodium: 139 mmol/L (ref 135–145)

## 2023-01-09 LAB — IRON AND TIBC
Iron: 69 ug/dL (ref 28–170)
Saturation Ratios: 15 % (ref 10.4–31.8)
TIBC: 452 ug/dL — ABNORMAL HIGH (ref 250–450)
UIBC: 383 ug/dL

## 2023-01-09 LAB — VITAMIN B12: Vitamin B-12: 760 pg/mL (ref 180–914)

## 2023-01-09 LAB — FERRITIN: Ferritin: 24 ng/mL (ref 11–307)

## 2023-01-09 LAB — VITAMIN D 25 HYDROXY (VIT D DEFICIENCY, FRACTURES): Vit D, 25-Hydroxy: 29.65 ng/mL — ABNORMAL LOW (ref 30–100)

## 2023-01-11 ENCOUNTER — Inpatient Hospital Stay (HOSPITAL_BASED_OUTPATIENT_CLINIC_OR_DEPARTMENT_OTHER): Payer: BC Managed Care – PPO | Admitting: Medical Oncology

## 2023-01-11 ENCOUNTER — Encounter: Payer: Self-pay | Admitting: Medical Oncology

## 2023-01-11 ENCOUNTER — Inpatient Hospital Stay: Payer: BC Managed Care – PPO

## 2023-01-11 VITALS — BP 118/79 | HR 56 | Resp 18

## 2023-01-11 VITALS — BP 122/88 | Temp 97.1°F | Wt 166.4 lb

## 2023-01-11 DIAGNOSIS — E611 Iron deficiency: Secondary | ICD-10-CM

## 2023-01-11 DIAGNOSIS — E559 Vitamin D deficiency, unspecified: Secondary | ICD-10-CM | POA: Diagnosis not present

## 2023-01-11 DIAGNOSIS — D649 Anemia, unspecified: Secondary | ICD-10-CM

## 2023-01-11 DIAGNOSIS — S29012A Strain of muscle and tendon of back wall of thorax, initial encounter: Secondary | ICD-10-CM | POA: Diagnosis not present

## 2023-01-11 DIAGNOSIS — D509 Iron deficiency anemia, unspecified: Secondary | ICD-10-CM | POA: Diagnosis not present

## 2023-01-11 DIAGNOSIS — Z9884 Bariatric surgery status: Secondary | ICD-10-CM

## 2023-01-11 DIAGNOSIS — T148XXA Other injury of unspecified body region, initial encounter: Secondary | ICD-10-CM | POA: Diagnosis not present

## 2023-01-11 DIAGNOSIS — E538 Deficiency of other specified B group vitamins: Secondary | ICD-10-CM | POA: Diagnosis not present

## 2023-01-11 MED ORDER — SODIUM CHLORIDE 0.9 % IV SOLN
Freq: Once | INTRAVENOUS | Status: AC
  Filled 2023-01-11: qty 250

## 2023-01-11 MED ORDER — SODIUM CHLORIDE 0.9 % IV SOLN
200.0000 mg | Freq: Once | INTRAVENOUS | Status: AC
  Administered 2023-01-11: 200 mg via INTRAVENOUS
  Filled 2023-01-11: qty 200

## 2023-01-11 MED ORDER — SODIUM CHLORIDE 0.9 % IV SOLN
200.0000 mg | Freq: Once | INTRAVENOUS | Status: DC
  Filled 2023-01-11: qty 10

## 2023-01-11 MED ORDER — ERGOCALCIFEROL 1.25 MG (50000 UT) PO CAPS: 50000.0000 [IU] | ORAL_CAPSULE | ORAL | 3 refills | Status: DC

## 2023-01-11 NOTE — Progress Notes (Signed)
Hematology and Oncology Follow Up Visit  [name] [id number] 09/08/1970 52 y.o. 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 This fatigue has not resolved even with IV iron infusions as she was hoping. Since it has been a few months since her last IV infusion (June) she wanted to have her levels rechecked. There has been no bleeding to her knowledge: denies epistaxis, gingivitis, hemoptysis, hematemesis, hematuria, melena, excessive bruising, blood donation.   Since her last fall she did have a horseback riding accident. She has some neck pain from this which is being treated by orthopedics.       Wt Readings from Last 3 Encounters:  01/11/23 166 lb 6.4 oz (75.5 kg)  12/24/22 165 lb (74.8 kg)  12/18/22 165 lb (74.8 kg)     Medications:   Current Outpatient Medications:    lisinopril (ZESTRIL) 5 MG tablet, Take 1 tablet (5 mg total) by mouth daily., Disp: 90 tablet, Rfl: 0   methocarbamol (ROBAXIN) 500 MG tablet, Take 2 tablets (1,000 mg total) by mouth every 8 (eight) hours as needed for muscle spasms., Disp: 24 tablet, Rfl: 0   omeprazole (PRILOSEC) 40 MG capsule, Take 1 capsule (40 mg total) by mouth daily., Disp: 90 capsule, Rfl: 3   ondansetron (ZOFRAN-ODT) 4 MG disintegrating tablet, Take 1 tablet (4 mg total) by mouth every 8 (eight) hours as needed for nausea or vomiting., Disp: 20 tablet, Rfl: 0   oxyCODONE-acetaminophen (PERCOCET) 5-325 MG tablet, Take 1 tablet by mouth every 4 (four) hours as needed for severe pain., Disp: 15 tablet, Rfl: 0   traMADol (ULTRAM) 50 MG tablet, Take by mouth., Disp: , Rfl:    traZODone  (DESYREL) 50 MG tablet, TAKE 1/2 TO 1 TABLET(25 TO 50 MG) BY MOUTH AT BEDTIME AS NEEDED FOR SLEEP (Patient not taking: Reported on 01/11/2023), Disp: 30 tablet, Rfl: 2 No current facility-administered medications for this visit.  Facility-Administered Medications Ordered in Other Visits:    iron sucrose (VENOFER) 200 mg in sodium chloride 0.9 % 100 mL IVPB, 200 mg, Intravenous, Once, Louretta Shorten R, MD, Last Rate: 440 mL/hr at 01/11/23 1532, 200 mg at 01/11/23 1532  Allergies: No Known Allergies  Past Medical History, Surgical history, Social history, and Family History were reviewed and updated.  Review of Systems: Review of Systems - Oncology   Physical Exam:  weight is 166 lb 6.4 oz (75.5 kg). Her tympanic temperature is 97.1 F (36.2 C) (abnormal). Her blood pressure is 122/88.   Physical Exam General: NAD Cardiovascular: regular rate and rhythm Pulmonary: clear ant fields Extremities: no edema, no joint deformities Skin: no rashes Neurological: Weakness but otherwise nonfocal   Lab Results  Component Value Date   WBC 5.5 01/09/2023   HGB 13.3 01/09/2023   HCT 40.7 01/09/2023   MCV 86.4 01/09/2023   PLT 292 01/09/2023     Chemistry      Component Value Date/Time   NA 139 01/09/2023 1559   K 3.6 01/09/2023 1559   CL 106 01/09/2023 1559   CO2 22 01/09/2023 1559   BUN 21 (H)  01/09/2023 1559   CREATININE 0.74 01/09/2023 1559   CREATININE 0.64 11/11/2022 1505      Component Value Date/Time   CALCIUM 8.9 01/09/2023 1559   ALKPHOS 117 12/24/2022 1609   AST 24 12/24/2022 1609   ALT 50 (H) 12/24/2022 1609   BILITOT 0.8 12/24/2022 1609     Encounter Diagnoses  Name Primary?   History of bariatric surgery    Iron deficiency Yes     Assessment and Plan- Patient is a 52 y.o. 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 Given her history of bariatric surgery and preserved MCV level she may also have a folate or phosphorus deficiency causing her symptoms. Today we discuss continuation of her IV iron therapy along with some dietary adjustments she could trial.  We discussed that due to her bariatric surgery status she likely will have a difficult time absorbing these vitamins/minerals. When discussing her IV venofer therapy she asks for an adjustment as she is concerned with how high her iron levels were when rechecked the next day after iron infusion. We will start with 2 weekly sessions and bring her back in 2 months for recheck.   Disposition: Venofer today Venofer weekly x 2 total RTC 2 months MD/APP +_ Venofer, labs 1-7 days prior (CBC, CMP, iron, ferritin, folate, retic, phosphorus, B12, vitamin D)-[place]   Clent Jacks PA-C 8/14/20243:32 PM

## 2023-01-16 ENCOUNTER — Ambulatory Visit
Admission: EM | Admit: 2023-01-16 | Discharge: 2023-01-16 | Disposition: A | Discharge status: Home / Self Care | Payer: BC Managed Care – PPO

## 2023-01-16 DIAGNOSIS — S61239A Puncture wound without foreign body of unspecified finger without damage to nail, initial encounter: Secondary | ICD-10-CM | POA: Diagnosis not present

## 2023-01-16 DIAGNOSIS — L03012 Cellulitis of left finger: Secondary | ICD-10-CM | POA: Diagnosis not present

## 2023-01-16 MED ORDER — CEPHALEXIN 500 MG PO CAPS: 500.0000 mg | ORAL_CAPSULE | Freq: Four times a day (QID) | ORAL | 0 refills | Status: DC

## 2023-01-16 NOTE — ED Triage Notes (Signed)
Patient to Urgent Care with complaints of left index pain and swelling.  Injury occurred yesterday. Reports she was pricked by a rose bush. Redness and swelling extends into knuckle.   TDAP 01/14/2022.

## 2023-01-16 NOTE — ED Provider Notes (Signed)
Renaldo Fiddler    CSN: [id number] Arrival date & time: 01/16/23  1230      History   Chief Complaint Chief Complaint  Patient presents with   Finger Injury    HPI [name] is a 52 y.o. female.  Patient presents with 1 day history of pain, redness, swelling of her left index finger where she accidentally punctured it on a rose thorn.  No OTC medications taken.  No fever, chills, wound drainage, or other symptoms.  Last tetanus 1 year ago.  Her medical history includes hypertension.    The history is provided by the patient and medical records.    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   Severe episode of recurrent major depressive disorder, without psychotic features (HCC) 09/30/2021    Past Surgical History:  Procedure Laterality Date   ABDOMINAL HYSTERECTOMY     CHOLECYSTECTOMY     COLONOSCOPY WITH PROPOFOL N/A 07/07/2022   Procedure: COLONOSCOPY WITH PROPOFOL;  Surgeon: [name], MD;  Location: [facility] ENDOSCOPY;  Service: Gastroenterology;  Laterality: N/A;   ESOPHAGOGASTRODUODENOSCOPY N/A 07/07/2022   Procedure: ESOPHAGOGASTRODUODENOSCOPY (EGD);  Surgeon: [name], MD;  Location: [facility] ENDOSCOPY;  Service: Gastroenterology;  Laterality: N/A;   GASTRIC BYPASS      OB History   No obstetric history on file.      Home Medications    Prior to Admission medications   Medication Sig Start Date End Date Taking? Authorizing Provider  cephALEXin (KEFLEX) 500 MG capsule Take 1 capsule (500 mg total) by mouth 4 (four) times daily. 01/16/23  Yes Mickie Bail, NP  tiZANidine (ZANAFLEX) 4 MG tablet Take 4 mg by mouth every 6 (six) hours as needed. 01/11/23  Yes [provider]  ergocalciferol  (VITAMIN D2) 1.25 MG (50000 UT) capsule Take 1 capsule (50,000 Units total) by mouth once a week. 01/11/23   Rushie Chestnut, PA-C  lisinopril (ZESTRIL) 5 MG tablet Take 1 tablet (5 mg total) by mouth daily. 11/11/22   Berniece Salines, FNP  methocarbamol (ROBAXIN) 500 MG tablet Take 2 tablets (1,000 mg total) by mouth every 8 (eight) hours as needed for muscle spasms. Patient not taking: Reported on 01/16/2023 12/18/22   Gloris Manchester, MD  omeprazole (PRILOSEC) 40 MG capsule Take 1 capsule (40 mg total) by mouth daily. 07/26/22   [name], MD  ondansetron (ZOFRAN-ODT) 4 MG disintegrating tablet Take 1 tablet (4 mg total) by mouth every 8 (eight) hours as needed for nausea or vomiting. 12/18/22   Gloris Manchester, MD  oxyCODONE-acetaminophen (PERCOCET) 5-325 MG tablet Take 1 tablet by mouth every 4 (four) hours as needed for severe pain. 12/24/22 12/24/23  Fisher, Roselyn Bering, PA-C  traMADol (ULTRAM) 50 MG tablet Take by mouth. Patient not taking: Reported on 01/16/2023    [provider]  traZODone (DESYREL) 50 MG tablet TAKE 1/2 TO 1 TABLET(25 TO 50 MG) BY MOUTH AT BEDTIME AS NEEDED FOR SLEEP Patient not taking: Reported on 01/11/2023 05/04/22   Berniece Salines, FNP    Family History Family History  Problem Relation Age of Onset   Breast cancer Mother 60   Cancer Mother    Heart disease Father  Alcohol abuse Father    Diabetes Paternal Grandmother    Heart disease Brother    Anxiety disorder Brother     Social History Social History   Tobacco Use   Smoking status: Never   Smokeless tobacco: Never  Vaping Use   Vaping status: Never Used  Substance Use Topics   Alcohol use: Yes    Comment: occassional/social   Drug use: Yes    Types: Marijuana     Allergies   Patient has no known allergies.   Review of Systems Review of Systems  Constitutional:  Negative for chills and fever.  Musculoskeletal:  Positive for arthralgias and joint swelling.  Skin:  Positive for color change  and wound.  Neurological:  Negative for weakness and numbness.     Physical Exam Triage Vital Signs ED Triage Vitals [01/16/23 1247]  Encounter Vitals Group     BP      Systolic BP Percentile      Diastolic BP Percentile      Pulse Rate 73     Resp 18     Temp 97.7 F (36.5 C)     Temp src      SpO2 97 %     Weight      Height      Head Circumference      Peak Flow      Pain Score      Pain Loc      Pain Education      Exclude from Growth Chart    No data found.  Updated Vital Signs BP 122/89   Pulse 73   Temp 97.7 F (36.5 C)   Resp 18   SpO2 97%   Visual Acuity Right Eye Distance:   Left Eye Distance:   Bilateral Distance:    Right Eye Near:   Left Eye Near:    Bilateral Near:     Physical Exam Constitutional:      General: She is not in acute distress. HENT:     Mouth/Throat:     Mouth: Mucous membranes are moist.  Cardiovascular:     Rate and Rhythm: Normal rate and regular rhythm.  Pulmonary:     Effort: Pulmonary effort is normal. No respiratory distress.  Musculoskeletal:        General: Swelling and tenderness present. No deformity. Normal range of motion.  Skin:    General: Skin is warm and dry.     Findings: Erythema and lesion present.     Comments: Small puncture wound on dorsum of left index finger with surrounding erythema.  No drainage.  Neurological:     General: No focal deficit present.     Mental Status: She is alert and oriented to person, place, and time.     Sensory: No sensory deficit.     Motor: No weakness.      UC Treatments / Results  Labs (all labs ordered are listed, but only abnormal results are displayed) Labs Reviewed - No data to display  EKG   Radiology No results found.  Procedures Procedures (including critical care time)  Medications Ordered in UC Medications - No data to display  Initial Impression / Assessment and Plan / UC Course  I have reviewed the triage vital signs and the nursing  notes.  Pertinent labs & imaging results that were available during my care of the patient were reviewed by me and considered in my medical decision making (see chart for details).  Puncture wound with cellulitis of left index finger.  Tetanus up-to-date.  Treating with cephalexin.  Wound care instructions and signs of worsening infection discussed.  Instructed patient to go to the ED if she notes signs of worsening infection.  Education provided on cellulitis and puncture wound.  She agrees to plan of care.  Final Clinical Impressions(s) / UC Diagnoses   Final diagnoses:  Cellulitis of left index finger  Puncture wound of finger of left hand, initial encounter     Discharge Instructions      Take the cephalexin as directed.  Follow-up with your primary care provider.     ED Prescriptions     Medication Sig Dispense Auth. Provider   cephALEXin (KEFLEX) 500 MG capsule Take 1 capsule (500 mg total) by mouth 4 (four) times daily. 28 capsule Mickie Bail, NP      PDMP not reviewed this encounter.   Mickie Bail, NP 01/16/23 1332

## 2023-01-16 NOTE — Discharge Instructions (Addendum)
Take the cephalexin as directed.  Follow up with your primary care provider.    

## 2023-01-18 ENCOUNTER — Inpatient Hospital Stay: Payer: BC Managed Care – PPO

## 2023-01-21 ENCOUNTER — Encounter: Payer: Self-pay | Admitting: Emergency Medicine

## 2023-01-21 ENCOUNTER — Other Ambulatory Visit: Payer: Self-pay

## 2023-01-21 ENCOUNTER — Ambulatory Visit: Payer: Self-pay

## 2023-01-21 ENCOUNTER — Emergency Department
Admission: EM | Admit: 2023-01-21 | Discharge: 2023-01-21 | Disposition: A | Discharge status: Home / Self Care | Payer: BC Managed Care – PPO | Attending: Emergency Medicine | Admitting: Emergency Medicine

## 2023-01-21 DIAGNOSIS — M7989 Other specified soft tissue disorders: Secondary | ICD-10-CM | POA: Diagnosis not present

## 2023-01-21 DIAGNOSIS — R2232 Localized swelling, mass and lump, left upper limb: Secondary | ICD-10-CM | POA: Diagnosis not present

## 2023-01-21 LAB — COMPREHENSIVE METABOLIC PANEL
ALT: 31 U/L (ref 0–44)
AST: 29 U/L (ref 15–41)
Albumin: 4.8 g/dL (ref 3.5–5.0)
Alkaline Phosphatase: 120 U/L (ref 38–126)
Anion gap: 12 (ref 5–15)
BUN: 17 mg/dL (ref 6–20)
CO2: 26 mmol/L (ref 22–32)
Calcium: 9.2 mg/dL (ref 8.9–10.3)
Chloride: 103 mmol/L (ref 98–111)
Creatinine, Ser: 0.7 mg/dL (ref 0.44–1.00)
GFR, Estimated: >60 mL/min (ref >60)
Glucose, Bld: 101 mg/dL — ABNORMAL HIGH (ref 70–99)
Potassium: 3.6 mmol/L (ref 3.5–5.1)
Sodium: 141 mmol/L (ref 135–145)
Total Bilirubin: 0.7 mg/dL (ref 0.3–1.2)
Total Protein: 7.9 g/dL (ref 6.5–8.1)

## 2023-01-21 LAB — CBC WITH DIFFERENTIAL/PLATELET
Abs Immature Granulocytes: 0.01 10*3/uL (ref 0.00–0.07)
Basophils Absolute: 0 10*3/uL (ref 0.0–0.1)
Basophils Relative: 0 %
Eosinophils Absolute: 0.2 10*3/uL (ref 0.0–0.5)
Eosinophils Relative: 3 %
HCT: 45.9 % (ref 36.0–46.0)
Hemoglobin: 14.8 g/dL (ref 12.0–15.0)
Immature Granulocytes: 0 %
Lymphocytes Relative: 19 %
Lymphs Abs: 1.3 10*3/uL (ref 0.7–4.0)
MCH: 28.2 pg (ref 26.0–34.0)
MCHC: 32.2 g/dL (ref 30.0–36.0)
MCV: 87.4 fL (ref 80.0–100.0)
Monocytes Absolute: 0.3 10*3/uL (ref 0.1–1.0)
Monocytes Relative: 5 %
Neutro Abs: 4.8 10*3/uL (ref 1.7–7.7)
Neutrophils Relative %: 73 %
Platelets: 226 10*3/uL (ref 150–400)
RBC: 5.25 MIL/uL — ABNORMAL HIGH (ref 3.87–5.11)
RDW: 14.4 % (ref 11.5–15.5)
WBC: 6.5 10*3/uL (ref 4.0–10.5)
nRBC: 0 % (ref 0.0–0.2)

## 2023-01-21 LAB — LACTIC ACID, PLASMA: Lactic Acid, Venous: 1 mmol/L (ref 0.5–1.9)

## 2023-01-21 MED ORDER — HYDROCODONE-ACETAMINOPHEN 5-325 MG PO TABS: 1.0000 | ORAL_TABLET | Freq: Four times a day (QID) | ORAL | 0 refills | Status: DC | PRN

## 2023-01-21 MED ORDER — SULFAMETHOXAZOLE-TRIMETHOPRIM 800-160 MG PO TABS: 2.0000 | ORAL_TABLET | Freq: Two times a day (BID) | ORAL | 0 refills | Status: DC

## 2023-01-21 NOTE — ED Triage Notes (Addendum)
Pt via POV from home. Pt c/o L hand infection. States that she was seen at [facility] on 8/19 and was dx with cellulitis, states she has been on abx since then has not completed. Reports on Thursday, pain swelling and redness started to come back. Swelling noted to the L hand and L index finger, minimal redness noted. Pt is A&OX4 and NAD

## 2023-01-21 NOTE — Discharge Instructions (Signed)
Please take your antibiotic as prescribed and pain medication as needed.  This can make you drowsy, do not drive or operate machinery taking this.  Follow-up with an orthopedic specialist for further evaluation of your hand swelling if it is not improved.  Return to the ER for any new or worsening symptoms.

## 2023-01-21 NOTE — ED Notes (Signed)
Pt ambulated to the restroom with a steady gait. States that she feels like the swelling in her hand is getting worse. Provider notified.

## 2023-01-21 NOTE — ED Notes (Signed)
Gave pt an Ice pack for her hand.

## 2023-01-21 NOTE — ED Provider Notes (Signed)
[facility] Provider Note    Event Date/Time   First MD Initiated Contact with Patient 01/21/23 1129     (approximate)   History   Wound Infection   HPI  [name] is a 52 y.o. female presenting to the emergency department for evaluation of swelling of her right index finger.  On Sunday, patient excellently punctured her left index finger with a rose thorn.  She was seen at urgent care where erythema and swelling was noted and she was placed on Keflex for suspected cellulitis.  Patient reports that she initially had improvement in erythema and swelling with this, but since Thursday, has had some worsening pain as well as recurrence of mild erythema.  No fevers or chills.  No recurrent injury.  No skin lesions or drainage.  No blistering.    Physical Exam   Triage Vital Signs: ED Triage Vitals  Encounter Vitals Group     BP 01/21/23 1108 125/76     Systolic BP Percentile --      Diastolic BP Percentile --      Pulse Rate 01/21/23 1108 (!) 56     Resp 01/21/23 1108 20     Temp 01/21/23 1108 98.3 F (36.8 C)     Temp Source 01/21/23 1108 Oral     SpO2 01/21/23 1108 100 %     Weight 01/21/23 1051 165 lb (74.8 kg)     Height 01/21/23 1051 5' 5.5" (1.664 m)     Head Circumference --      Peak Flow --      Pain Score 01/21/23 1051 6     Pain Loc --      Pain Education --      Exclude from Growth Chart --     Most recent vital signs: Vitals:   01/21/23 1108  BP: 125/76  Pulse: (!) 56  Resp: 20  Temp: 98.3 F (36.8 C)  SpO2: 100%     General: Awake, interactive  CV:  Regular rate Resp:  Unlabored respirations.  Abd:  Soft, nondistended.  Neuro:  Symmetric facial movement, fluid speech MSK:  There is swelling over the proximal phalanx of the left second digit with mild overlying erythema, not significantly warm but is tender to palpation.  Under ultrasound, mild cobblestoning noted, no fluid pockets or visible retained foreign bodies.   The finger is not held in the flexed position, there is no tenderness over the flexor tendon sheath, the swelling is limited to the proximal phalanx, does not involve the entire digit and patient does not have pain with extension of the finger.  Sensation intact throughout the digit.   ED Results / Procedures / Treatments   Labs (all labs ordered are listed, but only abnormal results are displayed) Labs Reviewed  COMPREHENSIVE METABOLIC PANEL - Abnormal; Notable for the following components:      Result Value   Glucose, Bld 101 (*)    All other components within normal limits  CBC WITH DIFFERENTIAL/PLATELET - Abnormal; Notable for the following components:   RBC 5.25 (*)    All other components within normal limits  LACTIC ACID, PLASMA     EKG EKG independently reviewed interpreted by myself (ER attending) demonstrates:    RADIOLOGY Imaging independently reviewed and interpreted by myself demonstrates:    PROCEDURES:  Critical Care performed: No  Procedures   MEDICATIONS ORDERED IN ED: Medications - No data to display   IMPRESSION / MDM / ASSESSMENT AND PLAN /  ED COURSE  I reviewed the triage vital signs and the nursing notes.  Differential diagnosis includes, but is not limited to, cellulitis, reactive swelling, abscess, retained foreign body, no lesion suggestive of sporotrichosis  Patient's presentation is most consistent with acute, uncomplicated illness.  52 year old female presenting to the emergency department for evaluation of ongoing finger swelling after recent thorn prick.  On exam, does have swelling over the proximal aspect of her digit, no [place] signs or systemic symptoms.  Vitals reassuring.  Labs sent from triage with normal white blood cell count and lactate.  Suspect that presentation may be reflective of incompletely treated cellulitis versus a reactive swelling without acute infectious process.  Will DC with course of Bactrim as well as a short  course of pain medication.  Strict return precautions provided.      FINAL CLINICAL IMPRESSION(S) / ED DIAGNOSES   Final diagnoses:  Swelling of finger of left hand     Rx / DC Orders   ED Discharge Orders          Ordered    sulfamethoxazole-trimethoprim (BACTRIM DS) 800-160 MG tablet  2 times daily        01/21/23 1325    HYDROcodone-acetaminophen (NORCO) 5-325 MG tablet  Every 6 hours PRN        01/21/23 1325             Note:  This document was prepared using Dragon voice recognition software and may include unintentional dictation errors.   Trinna Post, MD 01/21/23 [id number]

## 2023-01-22 ENCOUNTER — Telehealth: Payer: Self-pay | Admitting: Emergency Medicine

## 2023-01-22 MED ORDER — HYDROCODONE-ACETAMINOPHEN 5-325 MG PO TABS: 1.0000 | ORAL_TABLET | Freq: Four times a day (QID) | ORAL | 0 refills | Status: AC | PRN

## 2023-01-22 MED ORDER — DOXYCYCLINE HYCLATE 50 MG PO CAPS: 100.0000 mg | ORAL_CAPSULE | Freq: Two times a day (BID) | ORAL | 0 refills | Status: AC

## 2023-01-22 NOTE — Telephone Encounter (Signed)
Patient called back.  Noted to have interaction between Bactrim and lisinopril, requesting different antibiotic.  Had incomplete resolution of her cellulitis with Keflex, will send prescription for doxycycline.  Also noted that pharmacy did not have pain medication in stock, requesting switch to [facility] on [address].  New prescription sent to preferred pharmacy.

## 2023-01-25 ENCOUNTER — Inpatient Hospital Stay: Payer: BC Managed Care – PPO

## 2023-02-01 ENCOUNTER — Telehealth: Payer: Self-pay

## 2023-02-01 NOTE — Telephone Encounter (Signed)
Transition Care Management Unsuccessful Follow-up Telephone Call  Date of discharge and from where:  01/21/2023 [facility]  Attempts:  1st Attempt  Reason for unsuccessful TCM follow-up call:  Left voice message  Helma Argyle [facility]  [facility] Population Health Community Resource Care Guide   [email or web address]  ?? [phone]   Website: [email or web address]  [email or web address]

## 2023-02-02 ENCOUNTER — Inpatient Hospital Stay: Payer: BC Managed Care – PPO | Attending: Internal Medicine

## 2023-02-02 ENCOUNTER — Telehealth: Payer: Self-pay

## 2023-02-02 VITALS — BP 141/92 | HR 55 | Temp 97.0°F | Resp 18

## 2023-02-02 DIAGNOSIS — D509 Iron deficiency anemia, unspecified: Secondary | ICD-10-CM | POA: Insufficient documentation

## 2023-02-02 DIAGNOSIS — K909 Intestinal malabsorption, unspecified: Secondary | ICD-10-CM | POA: Insufficient documentation

## 2023-02-02 DIAGNOSIS — D649 Anemia, unspecified: Secondary | ICD-10-CM

## 2023-02-02 DIAGNOSIS — Z9884 Bariatric surgery status: Secondary | ICD-10-CM | POA: Insufficient documentation

## 2023-02-02 MED ORDER — SODIUM CHLORIDE 0.9 % IV SOLN
200.0000 mg | Freq: Once | INTRAVENOUS | Status: AC
  Administered 2023-02-02: 200 mg via INTRAVENOUS
  Filled 2023-02-02: qty 200

## 2023-02-02 MED ORDER — SODIUM CHLORIDE 0.9 % IV SOLN
Freq: Once | INTRAVENOUS | Status: AC
  Filled 2023-02-02: qty 250

## 2023-02-02 NOTE — Telephone Encounter (Signed)
Transition Care Management Unsuccessful Follow-up Telephone Call  Date of discharge and from where:  01/21/2023 [facility]  Attempts:  2nd Attempt  Reason for unsuccessful TCM follow-up call:  Left voice message  [name]  [facility] Population Health Community Resource Care Guide   [email or web address]  ?? [phone]   Website: [email or web address]  [email or web address]

## 2023-02-06 DIAGNOSIS — R519 Headache, unspecified: Secondary | ICD-10-CM | POA: Diagnosis not present

## 2023-02-06 DIAGNOSIS — Z9884 Bariatric surgery status: Secondary | ICD-10-CM | POA: Diagnosis not present

## 2023-02-06 DIAGNOSIS — R5383 Other fatigue: Secondary | ICD-10-CM | POA: Diagnosis not present

## 2023-02-08 DIAGNOSIS — S29012A Strain of muscle and tendon of back wall of thorax, initial encounter: Secondary | ICD-10-CM | POA: Diagnosis not present

## 2023-02-09 ENCOUNTER — Inpatient Hospital Stay: Payer: BC Managed Care – PPO

## 2023-02-10 ENCOUNTER — Ambulatory Visit: Payer: BC Managed Care – PPO | Admitting: Nurse Practitioner

## 2023-02-10 NOTE — Progress Notes (Deleted)
There were no vitals taken for this visit.   Subjective:    Patient ID: [name], female    DOB: [birth year], 52 y.o.   MRN: [id number]  HPI: [name] is a 52 y.o. female  No chief complaint on file.  Hypertension:  -Medications: lisinopril 5 mg daily -Patient is compliant with above medications and reports no side effects. -Checking BP at home (average): *** -Highest BP at home: *** -Lowest BP at home: *** -Denies any SOB, CP, vision changes, LE edema or symptoms of hypotension -Diet: recommend DASH diet  -Exercise: recommend 150 min of physical activity weekly        02/02/2023    4:22 PM 02/02/2023    3:38 PM 01/21/2023   11:08 AM  Vitals with BMI  Systolic 141 152 409  Diastolic 92 86 76  Pulse 55 57 56    Headaches: she reported that she woke up with a headache. She says that she has been having more headaches recently . She reports she has had 1 headache every other day.   She says she has never had headaches like this before.  She denies any blurred vision.  She reports the pain is a throbbing pain on both sides of her temples.  Obtained ct scan and referral was placed to neurology. Patient last seen by neurology on 02/06/2023. No changes to medications at this time.  Patient reports improvement on headaches, has follow up with neurology in March.   Fatigue/iron deficiency anemia:patient continues to endorse fatigue,  she is seeing hematology for iron infusions. Referral has been placed for sleep study.    Depression/anxiety:not on medication, previously prescribed lexapro and buspar but patient never started it. Previously placed referral to psychiatry but patient has not been.     11/11/2022    2:42 PM 10/10/2022    2:11 PM 09/05/2022   11:01 AM 07/26/2022    2:43 PM 06/24/2022    2:07 PM  Depression screen PHQ 2/9  Decreased Interest 1 1 0 2 0  Down, Depressed, Hopeless 1 1 0 2 0  PHQ - 2 Score 2 2 0 4 0  Altered sleeping 0  1 3 0  Tired, decreased energy  1  1 2  0  Change in appetite 0  1 3 0  Feeling bad or failure about yourself  0  0 1 0  Trouble concentrating 1  1 2  0  Moving slowly or fidgety/restless 0  0 1 0  Suicidal thoughts 0  0 1 0  PHQ-9 Score 4  4 17  0  Difficult doing work/chores Not difficult at all  Not difficult at all Very difficult        11/11/2022    2:42 PM 09/05/2022   11:02 AM 07/26/2022    2:46 PM 10/11/2021    8:46 AM  GAD 7 : Generalized Anxiety Score  Nervous, Anxious, on Edge 0 0 2 1  Control/stop worrying 0 0 1 0  Worry too much - different things 0 0 2 1  Trouble relaxing 0 0 2 2  Restless 0 0 1 1  Easily annoyed or irritable 0 1 3 3   Afraid - awful might happen 0 0 0 0  Total GAD 7 Score 0 1 11 8   Anxiety Difficulty Not difficult at all Not difficult at all Somewhat difficult Somewhat difficult     Relevant past medical, surgical, family and social history reviewed and updated as indicated. Interim medical history since our last  visit reviewed. Allergies and medications reviewed and updated.  Review of Systems Constitutional: Negative for fever or weight change.positive for fatigue  Respiratory: Negative for cough and shortness of breath.   Cardiovascular: Negative for chest pain or palpitations.  Gastrointestinal: Negative for abdominal pain, no bowel changes.  Musculoskeletal: Negative for gait problem or joint swelling.  Skin: Negative for rash.  Neurological: Negative for dizziness, positive for headache.  No other specific complaints in a complete review of systems (except as listed in HPI above).      Objective:    There were no vitals taken for this visit.  Wt Readings from Last 3 Encounters:  01/21/23 165 lb (74.8 kg)  01/11/23 166 lb 6.4 oz (75.5 kg)  12/24/22 165 lb (74.8 kg)    Physical Exam   Constitutional: Patient appears well-developed and well-nourished.  No distress.  HEENT: head atraumatic, normocephalic, pupils equal and reactive to light, neck supple Cardiovascular:  Normal rate, regular rhythm and normal heart sounds.  No murmur heard. No BLE edema. Pulmonary/Chest: Effort normal and breath sounds normal. No respiratory distress. Abdominal: Soft.  There is no tenderness. Neuro: equal grip, normal gait, no arm drift, no slurred speech Psychiatric: Patient has a normal mood and affect. behavior is normal. Judgment and thought content normal.  Results for orders placed or performed during the hospital encounter of 01/21/23  Lactic acid, plasma  Result Value Ref Range   Lactic Acid, Venous 1.0 0.5 - 1.9 mmol/L  Comprehensive metabolic panel  Result Value Ref Range   Sodium 141 135 - 145 mmol/L   Potassium 3.6 3.5 - 5.1 mmol/L   Chloride 103 98 - 111 mmol/L   CO2 26 22 - 32 mmol/L   Glucose, Bld 101 (H) 70 - 99 mg/dL   BUN 17 6 - 20 mg/dL   Creatinine, Ser 5.28 0.44 - 1.00 mg/dL   Calcium 9.2 8.9 - 41.3 mg/dL   Total Protein 7.9 6.5 - 8.1 g/dL   Albumin 4.8 3.5 - 5.0 g/dL   AST 29 15 - 41 U/L   ALT 31 0 - 44 U/L   Alkaline Phosphatase 120 38 - 126 U/L   Total Bilirubin 0.7 0.3 - 1.2 mg/dL   GFR, Estimated >24 >40 mL/min   Anion gap 12 5 - 15  CBC with Differential  Result Value Ref Range   WBC 6.5 4.0 - 10.5 K/uL   RBC 5.25 (H) 3.87 - 5.11 MIL/uL   Hemoglobin 14.8 12.0 - 15.0 g/dL   HCT 10.2 72.5 - 36.6 %   MCV 87.4 80.0 - 100.0 fL   MCH 28.2 26.0 - 34.0 pg   MCHC 32.2 30.0 - 36.0 g/dL   RDW 44.0 34.7 - 42.5 %   Platelets 226 150 - 400 K/uL   nRBC 0.0 0.0 - 0.2 %   Neutrophils Relative % 73 %   Neutro Abs 4.8 1.7 - 7.7 K/uL   Lymphocytes Relative 19 %   Lymphs Abs 1.3 0.7 - 4.0 K/uL   Monocytes Relative 5 %   Monocytes Absolute 0.3 0.1 - 1.0 K/uL   Eosinophils Relative 3 %   Eosinophils Absolute 0.2 0.0 - 0.5 K/uL   Basophils Relative 0 %   Basophils Absolute 0.0 0.0 - 0.1 K/uL   Immature Granulocytes 0 %   Abs Immature Granulocytes 0.01 0.00 - 0.07 K/uL      Assessment & Plan:   Problem List Items Addressed This Visit    None  Follow up plan: No follow-ups on file.

## 2023-02-11 ENCOUNTER — Other Ambulatory Visit: Payer: Self-pay | Admitting: Nurse Practitioner

## 2023-02-13 NOTE — Telephone Encounter (Signed)
Requested Prescriptions  Pending Prescriptions Disp Refills   lisinopril (ZESTRIL) 5 MG tablet [Pharmacy Med Name: LISINOPRIL 5 MG TABLET] 90 tablet 0    Sig: TAKE 1 TABLET (5 MG TOTAL) BY MOUTH DAILY.     Cardiovascular:  ACE Inhibitors Failed - 02/11/2023  1:04 AM      Failed - Last BP in normal range    BP Readings from Last 1 Encounters:  02/02/23 (!) 141/92         Passed - Cr in normal range and within 180 days    Creat  Date Value Ref Range Status  11/11/2022 0.64 0.50 - 1.03 mg/dL Final   Creatinine, Ser  Date Value Ref Range Status  01/21/2023 0.70 0.44 - 1.00 mg/dL Final         Passed - K in normal range and within 180 days    Potassium  Date Value Ref Range Status  01/21/2023 3.6 3.5 - 5.1 mmol/L Final         Passed - Patient is not pregnant      Passed - Valid encounter within last 6 months    Recent Outpatient Visits           3 months ago Hypertension, unspecified type   [facility] Berniece Salines, FNP   5 months ago Anxiety   [facility] Della Goo F, FNP   6 months ago Severe episode of recurrent major depressive disorder, without psychotic features [facility])   [facility] Berniece Salines, FNP   7 months ago Subacute cough   [facility] Margarita Mail, DO   8 months ago Annual physical exam   [facility] Berniece Salines, FNP       Future Appointments             In 2 days Zane Herald, Rudolpho Sevin, FNP [facility], [facility]

## 2023-02-14 NOTE — Progress Notes (Unsigned)
There were no vitals taken for this visit.   Subjective:    Patient ID: [name], female    DOB: March 23, 1971, 52 y.o.   MRN: [id number]  HPI: Iyonnah Vivar is a 52 y.o. female  No chief complaint on file.  Hypertension:  -Medications: lisinopril 5 mg daily -Patient is compliant with above medications and reports no side effects. -Checking BP at home (average): *** -Highest BP at home: *** -Lowest BP at home: *** -Denies any SOB, CP, vision changes, LE edema or symptoms of hypotension -Diet: recommend DASH diet  -Exercise: recommend 150 min of physical activity weekly        02/02/2023    4:22 PM 02/02/2023    3:38 PM 01/21/2023   11:08 AM  Vitals with BMI  Systolic 141 152 409  Diastolic 92 86 76  Pulse 55 57 56    Headaches: she reported that she woke up with a headache. She says that she has been having more headaches recently . She reports she has had 1 headache every other day.   She says she has never had headaches like this before.  She denies any blurred vision.  She reports the pain is a throbbing pain on both sides of her temples.  Obtained ct scan and referral was placed to neurology. Patient last seen by neurology on 02/06/2023. No changes to medications at this time.  Patient reports improvement on headaches, has follow up with neurology in March.   Fatigue/iron deficiency anemia:patient continues to endorse fatigue,  she is seeing hematology for iron infusions. Referral has been placed for sleep study.    Depression/anxiety:not on medication, previously prescribed lexapro and buspar but patient never started it. Previously placed referral to psychiatry but patient has not been.     11/11/2022    2:42 PM 10/10/2022    2:11 PM 09/05/2022   11:01 AM 07/26/2022    2:43 PM 06/24/2022    2:07 PM  Depression screen PHQ 2/9  Decreased Interest 1 1 0 2 0  Down, Depressed, Hopeless 1 1 0 2 0  PHQ - 2 Score 2 2 0 4 0  Altered sleeping 0  1 3 0  Tired, decreased energy  1  1 2  0  Change in appetite 0  1 3 0  Feeling bad or failure about yourself  0  0 1 0  Trouble concentrating 1  1 2  0  Moving slowly or fidgety/restless 0  0 1 0  Suicidal thoughts 0  0 1 0  PHQ-9 Score 4  4 17  0  Difficult doing work/chores Not difficult at all  Not difficult at all Very difficult        11/11/2022    2:42 PM 09/05/2022   11:02 AM 07/26/2022    2:46 PM 10/11/2021    8:46 AM  GAD 7 : Generalized Anxiety Score  Nervous, Anxious, on Edge 0 0 2 1  Control/stop worrying 0 0 1 0  Worry too much - different things 0 0 2 1  Trouble relaxing 0 0 2 2  Restless 0 0 1 1  Easily annoyed or irritable 0 1 3 3   Afraid - awful might happen 0 0 0 0  Total GAD 7 Score 0 1 11 8   Anxiety Difficulty Not difficult at all Not difficult at all Somewhat difficult Somewhat difficult     Relevant past medical, surgical, family and social history reviewed and updated as indicated. Interim medical history since our last  visit reviewed. Allergies and medications reviewed and updated.  Review of Systems Constitutional: Negative for fever or weight change.positive for fatigue  Respiratory: Negative for cough and shortness of breath.   Cardiovascular: Negative for chest pain or palpitations.  Gastrointestinal: Negative for abdominal pain, no bowel changes.  Musculoskeletal: Negative for gait problem or joint swelling.  Skin: Negative for rash.  Neurological: Negative for dizziness, positive for headache.  No other specific complaints in a complete review of systems (except as listed in HPI above).      Objective:    There were no vitals taken for this visit.  Wt Readings from Last 3 Encounters:  01/21/23 165 lb (74.8 kg)  01/11/23 166 lb 6.4 oz (75.5 kg)  12/24/22 165 lb (74.8 kg)    Physical Exam   Constitutional: Patient appears well-developed and well-nourished.  No distress.  HEENT: head atraumatic, normocephalic, pupils equal and reactive to light, neck supple Cardiovascular:  Normal rate, regular rhythm and normal heart sounds.  No murmur heard. No BLE edema. Pulmonary/Chest: Effort normal and breath sounds normal. No respiratory distress. Abdominal: Soft.  There is no tenderness. Neuro: equal grip, normal gait, no arm drift, no slurred speech Psychiatric: Patient has a normal mood and affect. behavior is normal. Judgment and thought content normal.  Results for orders placed or performed during the hospital encounter of 01/21/23  Lactic acid, plasma  Result Value Ref Range   Lactic Acid, Venous 1.0 0.5 - 1.9 mmol/L  Comprehensive metabolic panel  Result Value Ref Range   Sodium 141 135 - 145 mmol/L   Potassium 3.6 3.5 - 5.1 mmol/L   Chloride 103 98 - 111 mmol/L   CO2 26 22 - 32 mmol/L   Glucose, Bld 101 (H) 70 - 99 mg/dL   BUN 17 6 - 20 mg/dL   Creatinine, Ser 6.43 0.44 - 1.00 mg/dL   Calcium 9.2 8.9 - 32.9 mg/dL   Total Protein 7.9 6.5 - 8.1 g/dL   Albumin 4.8 3.5 - 5.0 g/dL   AST 29 15 - 41 U/L   ALT 31 0 - 44 U/L   Alkaline Phosphatase 120 38 - 126 U/L   Total Bilirubin 0.7 0.3 - 1.2 mg/dL   GFR, Estimated >51 >88 mL/min   Anion gap 12 5 - 15  CBC with Differential  Result Value Ref Range   WBC 6.5 4.0 - 10.5 K/uL   RBC 5.25 (H) 3.87 - 5.11 MIL/uL   Hemoglobin 14.8 12.0 - 15.0 g/dL   HCT 41.6 60.6 - 30.1 %   MCV 87.4 80.0 - 100.0 fL   MCH 28.2 26.0 - 34.0 pg   MCHC 32.2 30.0 - 36.0 g/dL   RDW 60.1 09.3 - 23.5 %   Platelets 226 150 - 400 K/uL   nRBC 0.0 0.0 - 0.2 %   Neutrophils Relative % 73 %   Neutro Abs 4.8 1.7 - 7.7 K/uL   Lymphocytes Relative 19 %   Lymphs Abs 1.3 0.7 - 4.0 K/uL   Monocytes Relative 5 %   Monocytes Absolute 0.3 0.1 - 1.0 K/uL   Eosinophils Relative 3 %   Eosinophils Absolute 0.2 0.0 - 0.5 K/uL   Basophils Relative 0 %   Basophils Absolute 0.0 0.0 - 0.1 K/uL   Immature Granulocytes 0 %   Abs Immature Granulocytes 0.01 0.00 - 0.07 K/uL      Assessment & Plan:   Problem List Items Addressed This Visit    None  Follow up plan: No follow-ups on file.

## 2023-02-15 ENCOUNTER — Ambulatory Visit: Payer: BC Managed Care – PPO | Admitting: Nurse Practitioner

## 2023-02-15 ENCOUNTER — Other Ambulatory Visit: Payer: Self-pay

## 2023-02-15 ENCOUNTER — Encounter: Payer: Self-pay | Admitting: Nurse Practitioner

## 2023-02-15 VITALS — BP 124/78 | HR 73 | Temp 98.1°F | Resp 16 | Ht 65.5 in | Wt 167.4 lb

## 2023-02-15 DIAGNOSIS — K219 Gastro-esophageal reflux disease without esophagitis: Secondary | ICD-10-CM

## 2023-02-15 DIAGNOSIS — D509 Iron deficiency anemia, unspecified: Secondary | ICD-10-CM

## 2023-02-15 DIAGNOSIS — I10 Essential (primary) hypertension: Secondary | ICD-10-CM | POA: Diagnosis not present

## 2023-02-15 DIAGNOSIS — R519 Headache, unspecified: Secondary | ICD-10-CM

## 2023-02-15 DIAGNOSIS — F419 Anxiety disorder, unspecified: Secondary | ICD-10-CM

## 2023-02-15 DIAGNOSIS — F332 Major depressive disorder, recurrent severe without psychotic features: Secondary | ICD-10-CM

## 2023-02-15 DIAGNOSIS — F5105 Insomnia due to other mental disorder: Secondary | ICD-10-CM

## 2023-02-15 DIAGNOSIS — R5383 Other fatigue: Secondary | ICD-10-CM | POA: Insufficient documentation

## 2023-02-15 DIAGNOSIS — F99 Mental disorder, not otherwise specified: Secondary | ICD-10-CM

## 2023-02-15 MED ORDER — OMEPRAZOLE 40 MG PO CPDR
40.0000 mg | DELAYED_RELEASE_CAPSULE | Freq: Every day | ORAL | 3 refills | Status: DC
Start: 2023-02-15 — End: 2024-01-17

## 2023-02-15 NOTE — Assessment & Plan Note (Signed)
Continues to get iron infusions

## 2023-02-15 NOTE — Assessment & Plan Note (Signed)
Stable, not currently on medication

## 2023-02-15 NOTE — Assessment & Plan Note (Signed)
Continue taking omeprazole 40mg  daily.

## 2023-02-15 NOTE — Assessment & Plan Note (Signed)
Continue lisinopril 5 mg daily.  Blood pressure at goal.

## 2023-02-16 ENCOUNTER — Inpatient Hospital Stay: Payer: BC Managed Care – PPO

## 2023-02-16 VITALS — BP 143/91 | HR 63 | Temp 98.1°F | Resp 18

## 2023-02-16 DIAGNOSIS — D509 Iron deficiency anemia, unspecified: Secondary | ICD-10-CM | POA: Diagnosis not present

## 2023-02-16 DIAGNOSIS — Z9884 Bariatric surgery status: Secondary | ICD-10-CM | POA: Diagnosis not present

## 2023-02-16 DIAGNOSIS — D649 Anemia, unspecified: Secondary | ICD-10-CM

## 2023-02-16 DIAGNOSIS — K909 Intestinal malabsorption, unspecified: Secondary | ICD-10-CM | POA: Diagnosis not present

## 2023-02-16 MED ORDER — SODIUM CHLORIDE 0.9 % IV SOLN
Freq: Once | INTRAVENOUS | Status: AC
  Filled 2023-02-16: qty 250

## 2023-02-16 MED ORDER — SODIUM CHLORIDE 0.9 % IV SOLN
200.0000 mg | Freq: Once | INTRAVENOUS | Status: AC
  Administered 2023-02-16: 200 mg via INTRAVENOUS
  Filled 2023-02-16: qty 200

## 2023-02-21 DIAGNOSIS — M79645 Pain in left finger(s): Secondary | ICD-10-CM | POA: Diagnosis not present

## 2023-02-23 DIAGNOSIS — M25561 Pain in right knee: Secondary | ICD-10-CM | POA: Diagnosis not present

## 2023-02-23 DIAGNOSIS — M791 Myalgia, unspecified site: Secondary | ICD-10-CM | POA: Diagnosis not present

## 2023-02-23 DIAGNOSIS — M1711 Unilateral primary osteoarthritis, right knee: Secondary | ICD-10-CM | POA: Diagnosis not present

## 2023-03-08 ENCOUNTER — Telehealth: Payer: Self-pay | Admitting: Plastic Surgery

## 2023-03-08 DIAGNOSIS — Z9884 Bariatric surgery status: Secondary | ICD-10-CM | POA: Diagnosis not present

## 2023-03-08 DIAGNOSIS — Z8639 Personal history of other endocrine, nutritional and metabolic disease: Secondary | ICD-10-CM | POA: Diagnosis not present

## 2023-03-08 NOTE — Telephone Encounter (Signed)
Called pt to let her know since she has met with Dr Ladona Ridgel she would not be able to switch to Dr Ulice Bold, [email or web address] to call the office.

## 2023-03-09 ENCOUNTER — Telehealth (HOSPITAL_COMMUNITY): Payer: Self-pay | Admitting: Professional

## 2023-03-15 ENCOUNTER — Inpatient Hospital Stay: Payer: BC Managed Care – PPO | Attending: Internal Medicine

## 2023-03-15 DIAGNOSIS — F332 Major depressive disorder, recurrent severe without psychotic features: Secondary | ICD-10-CM | POA: Insufficient documentation

## 2023-03-15 DIAGNOSIS — F419 Anxiety disorder, unspecified: Secondary | ICD-10-CM | POA: Insufficient documentation

## 2023-03-17 ENCOUNTER — Telehealth (HOSPITAL_COMMUNITY): Payer: Self-pay | Admitting: Licensed Clinical Social Worker

## 2023-03-20 ENCOUNTER — Telehealth (HOSPITAL_COMMUNITY): Payer: Self-pay | Admitting: Licensed Clinical Social Worker

## 2023-03-22 ENCOUNTER — Ambulatory Visit: Payer: BC Managed Care – PPO

## 2023-03-22 ENCOUNTER — Ambulatory Visit: Payer: BC Managed Care – PPO | Admitting: Internal Medicine

## 2023-03-22 ENCOUNTER — Other Ambulatory Visit (HOSPITAL_COMMUNITY): Payer: BC Managed Care – PPO | Admitting: Professional

## 2023-03-22 DIAGNOSIS — F419 Anxiety disorder, unspecified: Secondary | ICD-10-CM | POA: Diagnosis not present

## 2023-03-22 DIAGNOSIS — F332 Major depressive disorder, recurrent severe without psychotic features: Secondary | ICD-10-CM | POA: Diagnosis not present

## 2023-03-23 ENCOUNTER — Ambulatory Visit: Payer: BC Managed Care – PPO

## 2023-03-23 ENCOUNTER — Ambulatory Visit: Payer: BC Managed Care – PPO | Admitting: Nurse Practitioner

## 2023-03-23 ENCOUNTER — Other Ambulatory Visit (HOSPITAL_COMMUNITY): Payer: BC Managed Care – PPO

## 2023-03-24 ENCOUNTER — Other Ambulatory Visit (HOSPITAL_COMMUNITY): Payer: BC Managed Care – PPO

## 2023-03-27 ENCOUNTER — Other Ambulatory Visit (HOSPITAL_COMMUNITY): Payer: BC Managed Care – PPO

## 2023-03-28 ENCOUNTER — Other Ambulatory Visit (HOSPITAL_COMMUNITY): Payer: BC Managed Care – PPO
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
## 2023-03-29 ENCOUNTER — Other Ambulatory Visit (HOSPITAL_COMMUNITY): Payer: BC Managed Care – PPO

## 2023-03-29 ENCOUNTER — Other Ambulatory Visit (HOSPITAL_COMMUNITY): Payer: BC Managed Care – PPO | Attending: Psychiatry

## 2023-03-29 ENCOUNTER — Other Ambulatory Visit (HOSPITAL_COMMUNITY): Payer: BC Managed Care – PPO | Attending: Psychiatry | Admitting: Professional

## 2023-03-29 DIAGNOSIS — F419 Anxiety disorder, unspecified: Secondary | ICD-10-CM | POA: Diagnosis not present

## 2023-03-29 DIAGNOSIS — F331 Major depressive disorder, recurrent, moderate: Secondary | ICD-10-CM | POA: Diagnosis not present

## 2023-03-29 DIAGNOSIS — R4589 Other symptoms and signs involving emotional state: Secondary | ICD-10-CM | POA: Insufficient documentation

## 2023-03-29 DIAGNOSIS — Z634 Disappearance and death of family member: Secondary | ICD-10-CM | POA: Diagnosis not present

## 2023-03-29 DIAGNOSIS — Z565 Uncongenial work environment: Secondary | ICD-10-CM | POA: Diagnosis not present

## 2023-03-29 DIAGNOSIS — F332 Major depressive disorder, recurrent severe without psychotic features: Secondary | ICD-10-CM

## 2023-03-29 MED ORDER — HYDROXYZINE HCL 25 MG PO TABS: 12.5000 mg | ORAL_TABLET | Freq: Three times a day (TID) | ORAL | 0 refills | Status: AC | PRN
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
## 2023-03-30 ENCOUNTER — Other Ambulatory Visit (HOSPITAL_COMMUNITY): Payer: BC Managed Care – PPO

## 2023-03-30 ENCOUNTER — Other Ambulatory Visit (HOSPITAL_COMMUNITY): Payer: BC Managed Care – PPO | Attending: Psychiatry

## 2023-03-30 DIAGNOSIS — F332 Major depressive disorder, recurrent severe without psychotic features: Secondary | ICD-10-CM | POA: Insufficient documentation

## 2023-03-30 DIAGNOSIS — F419 Anxiety disorder, unspecified: Secondary | ICD-10-CM | POA: Diagnosis not present

## 2023-03-30 DIAGNOSIS — R4589 Other symptoms and signs involving emotional state: Secondary | ICD-10-CM | POA: Insufficient documentation
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
## 2023-03-30 NOTE — Psych (Signed)
Virtual Visit via Video Note  I connected with [name] on 03/29/23 at  9:00 AM EDT by a video enabled telemedicine application and verified that I am speaking with the correct person using two identifiers.  Location: Patient: Home Provider: Clinical Home office   I discussed the limitations of evaluation and management by telemedicine and the availability of in person appointments. The patient expressed understanding and agreed to proceed.  Follow Up Instructions:    I discussed the assessment and treatment plan with the patient. The patient was provided an opportunity to ask questions and all were answered. The patient agreed with the plan and demonstrated an understanding of the instructions.   The patient was advised to call back or seek an in-person evaluation if the symptoms worsen or if the condition fails to improve as anticipated.  I provided 240 minutes of non-face-to-face time during this encounter.   [name], [facility]   [facility] PHP THERAPIST PROGRESS NOTE  [name] [id number]  Session Time: 9-10  Participation Level: Active  Behavioral Response: CasualAlertAnxious and Depressed  Type of Therapy: Group Therapy  Treatment Goals addressed: Coping  Progress Towards Goals: Initial  Interventions: CBT, DBT, Solution Focused, Strength-based, Supportive, and Reframing  Summary: [name] is a 52 y.o. female who presents with anxiety and depression symptoms. Clinician led check-in regarding current stressors and situation, and review of patient completed daily inventory. Clinician utilized active listening and empathetic response and validated patient emotions. Clinician facilitated processing group on pertinent issues.?    Therapist Response:  Patient arrived within time allowed. Patient rates her mood at a 2/3 on a scale of 1-10 with 10 being best. Pt states she feels "anxious." Pt states she slept 8-9 broke hours and ate "is variable." Pt  reports she has anxiety about being out of work and using the time after group to be productive. Pt identifies her goal today as getting laundry done.  Patient able to process. Patient engagement was limited.          Session Time: 10:00 am - 11:00 am   Participation Level: Active   Behavioral Response: CasualAlertDepressed   Type of Therapy: Group Therapy   Treatment Goals addressed: Coping   Progress Towards Goals: Progressing   Interventions: CBT, DBT, Solution Focused, Strength-based, Supportive, and Reframing   Therapist Response: Clinician introduced topic of "Positive Psychology". Group watched "Positive Psychology" Ted-Talk. Patients discussed how their "lens" of life affects the way they feel. Group discussed 5 strategies to help change lens. Patients identified one strategy they would be willing to try to change their "lens" for at least 21 days to create a new habit.   Therapist Response:  Pt was unable to identify a goal due to being in meeting with Dr. Alfonse Flavors.        Session Time: 11:00 -12:00   Participation Level: Active   Behavioral Response: CasualAlertDepressed   Type of Therapy: Group Therapy   Treatment Goals addressed: Coping   Progress Towards Goals: Progressing   Interventions: Strength-based, Supportive, and Reframing   Summary: Chaplaincy group with K. Claussen   Therapist Response: Pt participated and engaged in discussion.          Session Time: 12:00 -1:00   Participation Level: Active   Behavioral Response: CasualAlertDepressed   Type of Therapy: Group therapy   Treatment Goals addressed: Coping   Progress Towards Goals: Progressing   Interventions: OT group   Summary: 12:00 - 12:50: Occupational Therapy group with cln E.  Hollan.  12:50 - 1:00 Clinician assessed for immediate needs, medication compliance and efficacy, and safety concerns.   Therapist Response: 12:00 - 12:50: See note 12:50 - 1:00 pm: At check-out, patient  reports no immediate concerns. Patient demonstrates progress as evidenced by continued engagement and responsiveness to treatment. Patient denies SI/HI/self-harm thoughts at the end of group.       Suicidal/Homicidal: Nowithout intent/plan   Plan: Pt will continue in PHP while working to decrease depression and anxiety symptoms, increase daily functioning, and increase ability to manage symptoms in a healthy manner.     Collaboration of Care: Medication Management AEB Lamar Sprinkles, MD  Patient/Guardian was advised Release of Information must be obtained prior to any record release in order to collaborate their care with an outside provider. Patient/Guardian was advised if they have not already done so to contact the registration department to sign all necessary forms in order for [place] to release information regarding their care.   Consent: Patient/Guardian gives verbal consent for treatment and assignment of benefits for services provided during this visit. Patient/Guardian expressed understanding and agreed to proceed.   Diagnosis: Moderate episode of recurrent major depressive disorder (HCC) [F33.1]    1. Moderate episode of recurrent major depressive disorder (HCC)   2. Anxiety   3. Difficulty coping       [name], [facility] 03/29/23

## 2023-03-31 ENCOUNTER — Other Ambulatory Visit (HOSPITAL_COMMUNITY): Payer: BC Managed Care – PPO

## 2023-03-31 ENCOUNTER — Other Ambulatory Visit (HOSPITAL_COMMUNITY): Payer: BC Managed Care – PPO | Attending: Psychiatry | Admitting: Licensed Clinical Social Worker

## 2023-03-31 ENCOUNTER — Other Ambulatory Visit (HOSPITAL_COMMUNITY): Payer: BC Managed Care – PPO | Attending: Psychiatry

## 2023-03-31 DIAGNOSIS — Z79899 Other long term (current) drug therapy: Secondary | ICD-10-CM | POA: Diagnosis not present

## 2023-03-31 DIAGNOSIS — F332 Major depressive disorder, recurrent severe without psychotic features: Secondary | ICD-10-CM | POA: Insufficient documentation

## 2023-03-31 DIAGNOSIS — F419 Anxiety disorder, unspecified: Secondary | ICD-10-CM | POA: Diagnosis not present

## 2023-03-31 DIAGNOSIS — Z564 Discord with boss and workmates: Secondary | ICD-10-CM | POA: Insufficient documentation

## 2023-03-31 DIAGNOSIS — Z634 Disappearance and death of family member: Secondary | ICD-10-CM | POA: Diagnosis not present

## 2023-03-31 DIAGNOSIS — Z739 Problem related to life management difficulty, unspecified: Secondary | ICD-10-CM | POA: Diagnosis not present

## 2023-03-31 DIAGNOSIS — R4589 Other symptoms and signs involving emotional state: Secondary | ICD-10-CM | POA: Insufficient documentation

## 2023-04-02 ENCOUNTER — Encounter (HOSPITAL_COMMUNITY): Payer: Self-pay

## 2023-04-02 NOTE — Therapy (Signed)
[facility] PARTIAL HOSPITALIZATION PROGRAM [address] [place], [place], [address] Phone: [phone]   Fax:  [phone]  Occupational Therapy Treatment Virtual Visit via Video Note  I connected with [name] on 04/02/23 at  8:00 AM EDT by a video enabled telemedicine application and verified that I am speaking with the correct person using two identifiers.  Location: Patient: home Provider: office   I discussed the limitations of evaluation and management by telemedicine and the availability of in person appointments. The patient expressed understanding and agreed to proceed.    The patient was advised to call back or seek an in-person evaluation if the symptoms worsen or if the condition fails to improve as anticipated.  I provided 55 minutes of non-face-to-face time during this encounter.   Patient Details  Name: [name] MRN: [id number] Date of Birth: 06/06/1970 No data recorded  Encounter Date: 03/30/2023   OT End of Session - 04/02/23 1744     Visit Number 2    Number of Visits 20    Date for OT Re-Evaluation 05/02/23    OT Start Time 1200    OT Stop Time 1255    OT Time Calculation (min) 55 min             Past Medical History:  Diagnosis Date   Anxiety    Bowel obstruction (HCC)    Depression    Hypertension     Past Surgical History:  Procedure Laterality Date   ABDOMINAL HYSTERECTOMY     CHOLECYSTECTOMY     COLONOSCOPY WITH PROPOFOL N/A 07/07/2022   Procedure: COLONOSCOPY WITH PROPOFOL;  Surgeon: [name], MD;  Location: [facility] ENDOSCOPY;  Service: Gastroenterology;  Laterality: N/A;   ESOPHAGOGASTRODUODENOSCOPY N/A 07/07/2022   Procedure: ESOPHAGOGASTRODUODENOSCOPY (EGD);  Surgeon: [name], MD;  Location: [facility] ENDOSCOPY;  Service: Gastroenterology;  Laterality: N/A;   GASTRIC BYPASS      There were no vitals filed for this visit.   Subjective Assessment - 04/02/23 1743     Currently in Pain?  No/denies    Pain Score 0-No pain                Group Session:  S: Doing better today.  O: The objective of the group session is to explore and redefine the concept of productivity, emphasizing the importance of balance between work, rest, and leisure to enhance overall well-being. The session aims to: Educate participants on the broader definition of productivity that includes mental health and personal fulfillment alongside work-related achievements. Facilitate discussions around personal experiences related to the pressures of traditional productivity metrics. Introduce strategies for integrating leisure and rest into daily routines as essential components of productivity. Encourage reflection on personal values and redefine productivity to align with holistic health and life satisfaction. Participants will engage in guided discussions, reflective exercises, and goal-setting activities to develop a personalized understanding and application of this expanded productivity concept.   A: The patient actively participated in the group discussion, demonstrating a keen interest in redefining productivity to include wellness and leisure. The patient shared personal struggles with maintaining work-life balance and expressed a positive response to learning about the importance of integrating leisure for mental health. This engagement suggests a readiness to adopt new strategies for balancing daily activities, which may enhance overall well-being and reduce work-related stress.     P: Continue to attend PHP OT group sessions 5x week for 4 weeks to promote daily structure, social engagement, and opportunities to develop  and utilize adaptive strategies to maximize functional performance in preparation for safe transition and integration back into school, work, and the community. Plan to address topic of pt 2 in next OT group session.                   OT Education - 04/02/23  1743     Education Details Redefining Productivity 1              OT Short Term Goals - 04/02/23 1736       OT SHORT TERM GOAL #1   Title pt will be educated and independent w/ interventions to improve psychosicial social skills and ADL/iADLs for community re-entry    Time 4    Period Weeks    Status On-going    Target Date 05/02/23      OT SHORT TERM GOAL #2   Title Pt will demonstrate independence w/ grief mgmt in order to engage in successful community reentry at time of discharge    Status On-going      OT SHORT TERM GOAL #3   Title Pt will demonstrate independence w/ coping skills in order to engage in succesful community re-entry at time of discharge    Status On-going                      Plan - 04/02/23 1744     Psychosocial Skills Coping Strategies;Habits;Interpersonal Interaction;Routines and Behaviors             Patient will benefit from skilled therapeutic intervention in order to improve the following deficits and impairments:       Psychosocial Skills: Coping Strategies, Habits, Interpersonal Interaction, Routines and Behaviors   Visit Diagnosis: Difficulty coping    Problem List Patient Active Problem List   Diagnosis Date Noted   Difficulty coping 03/29/2023   Iron deficiency anemia 02/15/2023   Other fatigue 02/15/2023   Gastroesophageal reflux disease without esophagitis 02/15/2023   Anxiety 07/26/2022   Symptomatic anemia 07/07/2022   Hypertension 10/11/2021   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   Moderate episode of recurrent major depressive disorder (HCC) 09/30/2021    Ted Mcalpine, OT 04/02/2023, 5:44 PM  Kerrin Champagne, OT   [facility] HOSPITALIZATION PROGRAM [address] [place], [place], [address] Phone: [phone]   Fax:  [phone]  Name: [name] MRN: [id number] Date of Birth: 09-23-1970

## 2023-04-02 NOTE — Therapy (Signed)
[facility] PARTIAL HOSPITALIZATION PROGRAM [address] [place], [place], [address] Phone: [phone]   Fax:  [phone]  Occupational Therapy Treatment Virtual Visit via Video Note  I connected with [name] on 04/02/23 at  8:00 AM EDT by a video enabled telemedicine application and verified that I am speaking with the correct person using two identifiers.  Location: Patient: home Provider: office   I discussed the limitations of evaluation and management by telemedicine and the availability of in person appointments. The patient expressed understanding and agreed to proceed.    The patient was advised to call back or seek an in-person evaluation if the symptoms worsen or if the condition fails to improve as anticipated.  I provided 55 minutes of non-face-to-face time during this encounter.   Patient Details  Name: [name] MRN: [id number] Date of Birth: Feb 24, 1971 No data recorded  Encounter Date: 03/31/2023   OT End of Session - 04/02/23 1751     Visit Number 3    Number of Visits 20    Date for OT Re-Evaluation 05/02/23    OT Start Time 1200    OT Stop Time 1255    OT Time Calculation (min) 55 min             Past Medical History:  Diagnosis Date   Anxiety    Bowel obstruction (HCC)    Depression    Hypertension     Past Surgical History:  Procedure Laterality Date   ABDOMINAL HYSTERECTOMY     CHOLECYSTECTOMY     COLONOSCOPY WITH PROPOFOL N/A 07/07/2022   Procedure: COLONOSCOPY WITH PROPOFOL;  Surgeon: [name], MD;  Location: [facility] ENDOSCOPY;  Service: Gastroenterology;  Laterality: N/A;   ESOPHAGOGASTRODUODENOSCOPY N/A 07/07/2022   Procedure: ESOPHAGOGASTRODUODENOSCOPY (EGD);  Surgeon: [name], MD;  Location: [facility] ENDOSCOPY;  Service: Gastroenterology;  Laterality: N/A;   GASTRIC BYPASS      There were no vitals filed for this visit.   Subjective Assessment - 04/02/23 1751     Currently in Pain?  No/denies    Pain Score 0-No pain                Group Session:  S: Doing better today.   O: The objective of the group session is to explore and redefine the concept of productivity, emphasizing the importance of balance between work, rest, and leisure to enhance overall well-being. The session aims to: Educate participants on the broader definition of productivity that includes mental health and personal fulfillment alongside work-related achievements. Facilitate discussions around personal experiences related to the pressures of traditional productivity metrics. Introduce strategies for integrating leisure and rest into daily routines as essential components of productivity. Encourage reflection on personal values and redefine productivity to align with holistic health and life satisfaction. Participants will engage in guided discussions, reflective exercises, and goal-setting activities to develop a personalized understanding and application of this expanded productivity concept.   A: The patient actively participated in the group discussion, demonstrating a keen interest in redefining productivity to include wellness and leisure. The patient shared personal struggles with maintaining work-life balance and expressed a positive response to learning about the importance of integrating leisure for mental health. This engagement suggests a readiness to adopt new strategies for balancing daily activities, which may enhance overall well-being and reduce work-related stress.    P: Continue to attend PHP OT group sessions 5x week for 4 weeks to promote daily structure, social engagement, and opportunities to develop  and utilize adaptive strategies to maximize functional performance in preparation for safe transition and integration back into school, work, and the community. Plan to address topic of pt 2 in next OT group session.                   OT Education - 04/02/23 1751      Education Details Redefining Productivity 2              OT Short Term Goals - 04/02/23 1736       OT SHORT TERM GOAL #1   Title pt will be educated and independent w/ interventions to improve psychosicial social skills and ADL/iADLs for community re-entry    Time 4    Period Weeks    Status On-going    Target Date 05/02/23      OT SHORT TERM GOAL #2   Title Pt will demonstrate independence w/ grief mgmt in order to engage in successful community reentry at time of discharge    Status On-going      OT SHORT TERM GOAL #3   Title Pt will demonstrate independence w/ coping skills in order to engage in succesful community re-entry at time of discharge    Status On-going                      Plan - 04/02/23 1751     Psychosocial Skills Coping Strategies;Habits;Interpersonal Interaction;Routines and Behaviors             Patient will benefit from skilled therapeutic intervention in order to improve the following deficits and impairments:       Psychosocial Skills: Coping Strategies, Habits, Interpersonal Interaction, Routines and Behaviors   Visit Diagnosis: Difficulty coping    Problem List Patient Active Problem List   Diagnosis Date Noted   Difficulty coping 03/29/2023   Iron deficiency anemia 02/15/2023   Other fatigue 02/15/2023   Gastroesophageal reflux disease without esophagitis 02/15/2023   Anxiety 07/26/2022   Symptomatic anemia 07/07/2022   Hypertension 10/11/2021   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   Moderate episode of recurrent major depressive disorder (HCC) 09/30/2021    Ted Mcalpine, OT 04/02/2023, 5:51 PM  Kerrin Champagne, OT   [facility] HOSPITALIZATION PROGRAM [address] [place], [place], [address] Phone: [phone]   Fax:  [phone]  Name: [name] MRN: [id number] Date of Birth: 08-19-1970

## 2023-04-02 NOTE — Therapy (Signed)
[facility] PARTIAL HOSPITALIZATION PROGRAM [address] [place], [place], [address] Phone: [phone]   Fax:  [phone]  Occupational Therapy Evaluation Virtual Visit via Video Note  I connected with [name] on 04/02/23 at  8:00 AM EDT by a video enabled telemedicine application and verified that I am speaking with the correct person using two identifiers.  Location: Patient: home Provider: office   I discussed the limitations of evaluation and management by telemedicine and the availability of in person appointments. The patient expressed understanding and agreed to proceed.    The patient was advised to call back or seek an in-person evaluation if the symptoms worsen or if the condition fails to improve as anticipated.  I provided 85 minutes of non-face-to-face time during this encounter.   Patient Details  Name: [name] MRN: [id number] Date of Birth: [birth year] No data recorded  Encounter Date: 03/29/2023   OT End of Session - 04/02/23 1734     Visit Number 1    Number of Visits 20    Date for OT Re-Evaluation 05/02/23    OT Start Time 0930    OT Stop Time 1255   Eval: 30; Tx: 55   OT Time Calculation (min) 205 min    Activity Tolerance Patient tolerated treatment well    Behavior During Therapy WFL for tasks assessed/performed             Past Medical History:  Diagnosis Date   Anxiety    Bowel obstruction (HCC)    Depression    Hypertension     Past Surgical History:  Procedure Laterality Date   ABDOMINAL HYSTERECTOMY     CHOLECYSTECTOMY     COLONOSCOPY WITH PROPOFOL N/A 07/07/2022   Procedure: COLONOSCOPY WITH PROPOFOL;  Surgeon: [name], MD;  Location: [facility] ENDOSCOPY;  Service: Gastroenterology;  Laterality: N/A;   ESOPHAGOGASTRODUODENOSCOPY N/A 07/07/2022   Procedure: ESOPHAGOGASTRODUODENOSCOPY (EGD);  Surgeon: [name], MD;  Location: [facility] ENDOSCOPY;  Service: Gastroenterology;  Laterality: N/A;    GASTRIC BYPASS      There were no vitals filed for this visit.   Subjective Assessment - 04/02/23 1733     Subjective  I want to learn new coping skills while I am here. MDD / GAD    Currently in Pain? No/denies    Pain Score 0-No pain    Multiple Pain Sites No                    OCAIRS Mental Health Interview Summary of Client Scores:  Facilitates participation in occupation Allows participation in occupation Inhibits participation in occupation Restricts participation in occupation Comments:  Roles   X    Habits    X   Personal Causation   X    Values    X   Interests    X   Skills   X    Short-Term Goals   X    Long-term Goals   X    Interpretation of Past Experiences  X     Physical Environment  X     Social Environment  X     Readiness for Change   X      Need for Occupational Therapy:  4 Shows positive occupational participation, no need for OT.   3 Need for minimal intervention/consultative participation   2 Need for OT intervention indicated to restore/improve participation   1 Need for extensive OT intervention indicated to improve participation.  Referral  for follow up services also recommended.   Assessment:  Patient demonstrates behavior that INHIBITS participation in occupation.  Patient will benefit from occupational therapy intervention in order to improve time management, financial management, stress management, job readiness skills, social skills, and health management skills in preparation to return to full time community living and to be a productive community member.    Plan:  Patient will participate in skilled occupational therapy sessions individually or in a group setting to improve coping skills, psychosocial skills, and emotional skills required to return to prior level of function. Treatment will be 4-5 times per week for 4 weeks.      Group Session:  O: The objective of the telehealth group therapy session was to discuss the  significance of routines in promoting mental health and wellbeing. The OT aimed to explore the power of routines in providing structure, stability, and predictability in individuals' lives, as well as their role in establishing healthy habits, reducing stress and anxiety, managing time effectively, achieving goals, and fostering a sense of community and social connectedness.   Participants were guided to identify areas where routines could improve their mental health and were provided with tips for establishing and maintaining healthy routines. The session concluded by emphasizing the potential of routines to enhance overall quality of life.  Homework Assignment:  As part of the session, participants were assigned a homework task to reflect on their current routines and select one area of their lives where they could establish a new routine to improve their mental health and wellbeing. They were instructed to start small and implement the routine gradually, while seeking support from friends, family, or mental health professionals as needed. The participants were asked to report their progress in the next therapy session, focusing on the benefits and challenges encountered during the implementation of their chosen routine.   A: During the telehealth group therapy session, the patient actively participated in the discussion on the importance of routines in promoting mental health and wellbeing. The patient demonstrated a good understanding of the power of routines in providing structure, stability, and predictability in their life. They were able to identify areas in their life where routines could contribute to improving their overall mental health.  The patient showed motivation and willingness to reflect on their current habits and behaviors, recognizing the need for positive changes. They actively engaged in the session, sharing personal experiences and challenges related to establishing and maintaining  healthy routines. The patient expressed a desire to reduce stress and anxiety, manage their time more effectively, and achieve their goals through the implementation of routines.                   OT Education - 04/02/23 1734     Education Details OCAIRS / Routines 2    Person(s) Educated Patient    Methods Handout;Explanation    Comprehension Verbalized understanding              OT Short Term Goals - 04/02/23 1736       OT SHORT TERM GOAL #1   Title pt will be educated and independent w/ interventions to improve psychosicial social skills and ADL/iADLs for community re-entry    Time 4    Period Weeks    Status On-going    Target Date 05/02/23      OT SHORT TERM GOAL #2   Title Pt will demonstrate independence w/ grief mgmt in order to engage in successful community reentry at time of  discharge    Status On-going      OT SHORT TERM GOAL #3   Title Pt will demonstrate independence w/ coping skills in order to engage in succesful community re-entry at time of discharge    Status On-going                      Plan - 04/02/23 1735     Clinical Impression Statement Pt presents w/ deficits across multiple psychosocial domains that inhibit occupational performance in daily activities    OT Occupational Profile and History Problem Focused Assessment - Including review of records relating to presenting problem    Occupational performance deficits (Please refer to evaluation for details): Rest and Sleep;Work;Leisure;Social Participation    Psychosocial Skills Coping Strategies;Habits;Interpersonal Interaction;Routines and Behaviors    Rehab Potential Good    Clinical Decision Making Limited treatment options, no task modification necessary    Comorbidities Affecting Occupational Performance: None    Modification or Assistance to Complete Evaluation  No modification of tasks or assist necessary to complete eval    OT Frequency 5x / week    OT Duration 4  weeks    OT Treatment/Interventions Psychosocial skills training;Coping strategies training    Consulted and Agree with Plan of Care Patient             Patient will benefit from skilled therapeutic intervention in order to improve the following deficits and impairments:       Psychosocial Skills: Coping Strategies, Habits, Interpersonal Interaction, Routines and Behaviors   Visit Diagnosis: Moderate episode of recurrent major depressive disorder (HCC)  Difficulty coping  Anxiety  Severe episode of recurrent major depressive disorder, without psychotic features (HCC)    Problem List Patient Active Problem List   Diagnosis Date Noted   Difficulty coping 03/29/2023   Iron deficiency anemia 02/15/2023   Other fatigue 02/15/2023   Gastroesophageal reflux disease without esophagitis 02/15/2023   Anxiety 07/26/2022   Symptomatic anemia 07/07/2022   Hypertension 10/11/2021   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   Moderate episode of recurrent major depressive disorder (HCC) 09/30/2021    Ted Mcalpine, OT 04/02/2023, 5:38 PM Kerrin Champagne, OT  [facility] HOSPITALIZATION PROGRAM [address] [place], [place], [address] Phone: [phone]   Fax:  [phone]  Name: [name] MRN: [id number] Date of Birth: [birth year]

## 2023-04-02 NOTE — Psych (Signed)
Virtual Visit via Video Note  I connected with [name] on 03/30/23 at  9:00 AM EDT by a video enabled telemedicine application and verified that I am speaking with the correct person using two identifiers.  Location: Patient: Patient's home Provider: Clinical Home office   I discussed the limitations of evaluation and management by telemedicine and the availability of in person appointments. The patient expressed understanding and agreed to proceed.  I discussed the assessment and treatment plan with the patient. The patient was provided an opportunity to ask questions and all were answered. The patient agreed with the plan and demonstrated an understanding of the instructions.   The patient was advised to call back or seek an in-person evaluation if the symptoms worsen or if the condition fails to improve as anticipated.  Pt was provided 240 minutes of non-face-to-face time during this encounter.   Donia Guiles, LCSW   [facility] PHP THERAPIST PROGRESS NOTE  [name] [id number]  Session Time: 9:00 - 10:00  Participation Level: Active  Behavioral Response: CasualAlertAnxious and Depressed  Type of Therapy: Group Therapy  Treatment Goals addressed: Coping  Progress Towards Goals: Initial  Interventions: CBT, DBT, Solution Focused, Strength-based, Supportive, and Reframing  Summary: [name] is a 52 y.o. female who presents with anxiety and depression symptoms. Clinician led check-in regarding current stressors and situation, and review of patient completed daily inventory. Clinician utilized active listening and empathetic response and validated patient emotions. Clinician facilitated processing group on pertinent issues.?    Therapist Response:  Patient arrived within time allowed. Patient rates her mood at a 4 on a scale of 1-10 with 10 being best. Pt states she feels "stressed and distracted." Pt states she slept 8 broken hours and ate 3x. Pt reports  she had plans for the afternoon but took two naps instead. Pt states being frustrated that she didn't follow through. Pt she did pick up a prescription and watched tv. Pt struggles to be kind to herself. Patient able to process. Patient engagement was limited.          Session Time: 10:00 am - 11:00 am   Participation Level: Active   Behavioral Response: CasualAlertDepressed   Type of Therapy: Group Therapy   Treatment Goals addressed: Coping   Progress Towards Goals: Progressing   Interventions: CBT, DBT, Solution Focused, Strength-based, Supportive, and Reframing   Therapist Response: Cln facilitated processing group around difficult relationships. Group members shared struggles they face in relationships. Cln brought in topics of self-esteem, CBT thought challenging, boundaries, and communication to aid growth.   Therapist Response:  Pt able to process and provide support to group.          Session Time: 11:00 -12:00   Participation Level: Active   Behavioral Response: CasualAlertDepressed   Type of Therapy: Group Therapy   Treatment Goals addressed: Coping   Progress Towards Goals: Progressing   Interventions: CBT, DBT, Solution Focused, Strength-based, Supportive, and Reframing   Summary: Cln continued topic of CBT cognitive distortions and introduced thought challenging as a way to  utilize the "challenge" C in C-C-C. Group utilized Administrator, Civil Service questions" as a way to introduce challenges and reframe distorted thinking. Group members worked through pt examples to practice challenging distorted thinking.    Therapist Response: Pt engaged in discussion and demonstrates understanding of challenging distorted thoughts through practice.          Session Time: 12:00 -1:00   Participation Level: Active   Behavioral Response: CasualAlertDepressed  Type of Therapy: Group therapy   Treatment Goals addressed: Coping   Progress Towards Goals: Progressing    Interventions: OT group   Summary: 12:00 - 12:50: Occupational Therapy group with cln E. Hollan.  12:50 - 1:00 Clinician assessed for immediate needs, medication compliance and efficacy, and safety concerns.   Therapist Response: 12:00 - 12:50: See note 12:50 - 1:00 pm: At check-out, patient reports no immediate concerns. Patient demonstrates progress as evidenced by continued engagement and responsiveness to treatment. Patient denies SI/HI/self-harm thoughts at the end of group.       Suicidal/Homicidal: Nowithout intent/plan   Plan: Pt will continue in PHP while working to decrease depression and anxiety symptoms, increase daily functioning, and increase ability to manage symptoms in a healthy manner.     Collaboration of Care: Medication Management AEB Lamar Sprinkles, MD  Patient/Guardian was advised Release of Information must be obtained prior to any record release in order to collaborate their care with an outside provider. Patient/Guardian was advised if they have not already done so to contact the registration department to sign all necessary forms in order for [place] to release information regarding their care.   Consent: Patient/Guardian gives verbal consent for treatment and assignment of benefits for services provided during this visit. Patient/Guardian expressed understanding and agreed to proceed.   Diagnosis: Severe episode of recurrent major depressive disorder, without psychotic features (HCC) [F33.2]    1. Severe episode of recurrent major depressive disorder, without psychotic features (HCC)   2. Anxiety       Donia Guiles, LCSW

## 2023-04-03 ENCOUNTER — Encounter (HOSPITAL_COMMUNITY): Payer: Self-pay

## 2023-04-03 ENCOUNTER — Other Ambulatory Visit (HOSPITAL_COMMUNITY): Payer: BC Managed Care – PPO | Attending: Psychiatry | Admitting: Licensed Clinical Social Worker

## 2023-04-03 ENCOUNTER — Other Ambulatory Visit (HOSPITAL_COMMUNITY): Payer: BC Managed Care – PPO | Attending: Psychiatry

## 2023-04-03 DIAGNOSIS — R4589 Other symptoms and signs involving emotional state: Secondary | ICD-10-CM | POA: Insufficient documentation

## 2023-04-03 DIAGNOSIS — F332 Major depressive disorder, recurrent severe without psychotic features: Secondary | ICD-10-CM | POA: Insufficient documentation

## 2023-04-03 DIAGNOSIS — F331 Major depressive disorder, recurrent, moderate: Secondary | ICD-10-CM | POA: Diagnosis not present

## 2023-04-03 DIAGNOSIS — F411 Generalized anxiety disorder: Secondary | ICD-10-CM | POA: Insufficient documentation

## 2023-04-03 NOTE — Therapy (Signed)
[facility] PARTIAL HOSPITALIZATION PROGRAM [address] [place], [place], [address] Phone: [phone]   Fax:  [phone]  Occupational Therapy Treatment Virtual Visit via Video Note  I connected with [name] on 04/03/23 at  8:00 AM EST by a video enabled telemedicine application and verified that I am speaking with the correct person using two identifiers.  Location: Patient: home Provider: office   I discussed the limitations of evaluation and management by telemedicine and the availability of in person appointments. The patient expressed understanding and agreed to proceed.    The patient was advised to call back or seek an in-person evaluation if the symptoms worsen or if the condition fails to improve as anticipated.  I provided 55 minutes of non-face-to-face time during this encounter.  Patient Details  Name: [name] MRN: [id number] Date of Birth: [birth year] No data recorded  Encounter Date: 04/03/2023   OT End of Session - 04/03/23 2156     Visit Number 4    Number of Visits 20    Date for OT Re-Evaluation 05/02/23    OT Start Time 1200    OT Stop Time 1255    OT Time Calculation (min) 55 min             Past Medical History:  Diagnosis Date   Anxiety    Bowel obstruction (HCC)    Depression    Hypertension     Past Surgical History:  Procedure Laterality Date   ABDOMINAL HYSTERECTOMY     CHOLECYSTECTOMY     COLONOSCOPY WITH PROPOFOL N/A 07/07/2022   Procedure: COLONOSCOPY WITH PROPOFOL;  Surgeon: [name], MD;  Location: [facility] ENDOSCOPY;  Service: Gastroenterology;  Laterality: N/A;   ESOPHAGOGASTRODUODENOSCOPY N/A 07/07/2022   Procedure: ESOPHAGOGASTRODUODENOSCOPY (EGD);  Surgeon: [name], MD;  Location: [facility] ENDOSCOPY;  Service: Gastroenterology;  Laterality: N/A;   GASTRIC BYPASS      There were no vitals filed for this visit.   Subjective Assessment - 04/03/23 2155     Currently in Pain?  No/denies    Pain Score 0-No pain                 Group Session:  S: Doing a bit better today. Had a good weekend and I was able to get back out to the barn. That was nice.   O: The objective of the group session is to explore and redefine the concept of productivity, emphasizing the importance of balance between work, rest, and leisure to enhance overall well-being. The session aims to: Educate participants on the broader definition of productivity that includes mental health and personal fulfillment alongside work-related achievements. Facilitate discussions around personal experiences related to the pressures of traditional productivity metrics. Introduce strategies for integrating leisure and rest into daily routines as essential components of productivity. Encourage reflection on personal values and redefine productivity to align with holistic health and life satisfaction. Participants will engage in guided discussions, reflective exercises, and goal-setting activities to develop a personalized understanding and application of this expanded productivity concept.   A: The patient actively participated in the group discussion, demonstrating a keen interest in redefining productivity to include wellness and leisure. The patient shared personal struggles with maintaining work-life balance and expressed a positive response to learning about the importance of integrating leisure for mental health. This engagement suggests a readiness to adopt new strategies for balancing daily activities, which may enhance overall well-being and reduce work-related stress.   P: Continue to attend  PHP OT group sessions 5x week for 4 weeks to promote daily structure, social engagement, and opportunities to develop and utilize adaptive strategies to maximize functional performance in preparation for safe transition and integration back into school, work, and the community. Plan to address topic of tbd in next  OT group session.                  OT Education - 04/03/23 2156     Education Details Redefining Productivity 3              OT Short Term Goals - 04/02/23 1736       OT SHORT TERM GOAL #1   Title pt will be educated and independent w/ interventions to improve psychosicial social skills and ADL/iADLs for community re-entry    Time 4    Period Weeks    Status On-going    Target Date 05/02/23      OT SHORT TERM GOAL #2   Title Pt will demonstrate independence w/ grief mgmt in order to engage in successful community reentry at time of discharge    Status On-going      OT SHORT TERM GOAL #3   Title Pt will demonstrate independence w/ coping skills in order to engage in succesful community re-entry at time of discharge    Status On-going                      Plan - 04/03/23 2156     Psychosocial Skills Coping Strategies;Habits;Interpersonal Interaction;Routines and Behaviors             Patient will benefit from skilled therapeutic intervention in order to improve the following deficits and impairments:       Psychosocial Skills: Coping Strategies, Habits, Interpersonal Interaction, Routines and Behaviors   Visit Diagnosis: Difficulty coping    Problem List Patient Active Problem List   Diagnosis Date Noted   Difficulty coping 03/29/2023   Iron deficiency anemia 02/15/2023   Other fatigue 02/15/2023   Gastroesophageal reflux disease without esophagitis 02/15/2023   Anxiety 07/26/2022   Symptomatic anemia 07/07/2022   Hypertension 10/11/2021   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

## 2023-04-03 NOTE — Psych (Signed)
Active     OP Depression     LTG: Reduce frequency, intensity, and duration of depression symptoms so that daily functioning is improved (Initial)     Start:  03/29/23    Expected End:  06/29/23         LTG: Increase coping skills to manage depression and improve ability to perform daily activities (Initial)     Start:  03/29/23    Expected End:  06/29/23         STG: [name] will attend at least 80% of scheduled PHP sessions    (Initial)     Start:  03/29/23    Expected End:  06/29/23         STG: [name] will participate in at least 80% of scheduled individual psychotherapy sessions  (Initial)     Start:  03/29/23    Expected End:  06/29/23         STG: [name] will complete at least 80% of assigned homework  (Initial)     Start:  03/29/23    Expected End:  06/29/23         STG: [name] will identify cognitive patterns and beliefs that support depression (Initial)     Start:  03/29/23    Expected End:  06/29/23         Encourage [name] to participate in recovery peer support activities weekly      Start:  03/29/23         Work with [name] to identify the major components of a recent episode of depression: physical symptoms, major thoughts and images, and major behaviors they experienced     Start:  03/29/23         Therapist will educate patient on cognitive distortions and the rationale for treatment of depression     Start:  03/29/23         [name] will identify AT LEAST 3 cognitive distortions they are currently using and write reframing statements to replace them     Start:  03/29/23         Therapist will review PLEASE Skills (Treat Physical Illness, Balance Eating, Avoid Mood-Altering Substances, Balance Sleep and Get Exercise) with patient     Start:  03/29/23           [name] verbally agrees to treatment plan.

## 2023-04-04 ENCOUNTER — Other Ambulatory Visit (HOSPITAL_COMMUNITY): Payer: BC Managed Care – PPO

## 2023-04-05 ENCOUNTER — Other Ambulatory Visit (HOSPITAL_COMMUNITY): Payer: BC Managed Care – PPO | Admitting: Licensed Clinical Social Worker

## 2023-04-05 ENCOUNTER — Other Ambulatory Visit (HOSPITAL_COMMUNITY): Payer: BC Managed Care – PPO

## 2023-04-05 DIAGNOSIS — F331 Major depressive disorder, recurrent, moderate: Secondary | ICD-10-CM | POA: Diagnosis not present

## 2023-04-05 DIAGNOSIS — F411 Generalized anxiety disorder: Secondary | ICD-10-CM | POA: Diagnosis not present

## 2023-04-05 DIAGNOSIS — R4589 Other symptoms and signs involving emotional state: Secondary | ICD-10-CM | POA: Diagnosis not present

## 2023-04-05 DIAGNOSIS — F332 Major depressive disorder, recurrent severe without psychotic features: Secondary | ICD-10-CM

## 2023-04-06 ENCOUNTER — Other Ambulatory Visit (HOSPITAL_COMMUNITY): Payer: BC Managed Care – PPO | Admitting: Licensed Clinical Social Worker

## 2023-04-06 ENCOUNTER — Encounter (HOSPITAL_COMMUNITY): Payer: Self-pay

## 2023-04-06 ENCOUNTER — Other Ambulatory Visit (HOSPITAL_COMMUNITY): Payer: BC Managed Care – PPO

## 2023-04-06 DIAGNOSIS — R4589 Other symptoms and signs involving emotional state: Secondary | ICD-10-CM | POA: Diagnosis not present

## 2023-04-06 DIAGNOSIS — F331 Major depressive disorder, recurrent, moderate: Secondary | ICD-10-CM | POA: Diagnosis not present

## 2023-04-06 DIAGNOSIS — F332 Major depressive disorder, recurrent severe without psychotic features: Secondary | ICD-10-CM

## 2023-04-06 DIAGNOSIS — F411 Generalized anxiety disorder: Secondary | ICD-10-CM | POA: Diagnosis not present

## 2023-04-06 NOTE — Progress Notes (Signed)
Met with patient today, through virtual connection, as she presented with restricted affect, depressed mood and admission of feeling more depressed over the past 2 days since learning the outcome of the national presidential election.  Patient stated this affected her quite a bit because she was just not prepared to deal with the upcoming 4 years and possible changes in policies that may affect her beliefs.  Patient reported she only wanted to stay in bed yesterday but did attend group and that this helped.  Patient admitted this occurrence was going right along with what she has been managing with her current job and feeling the culture of the company she works is not openly receptive to all people.  Patient reported she is using newly learned skills to manage these thoughts and denied any suicidal or homicidal ideations, with no plans, intent, or means to want to harm self or others at this time. Patient discussed wanting to build her supports of like-thinking others around her to help and that just knowing many others think differently that she will have to interact is challenging.  Patient reported she does have a job interview later today with a company she previously worked.  Stated if offered the job she would probably take it even though she likes the work she currently does better, only because the acceptance culture with her previous job was much better and more inline with her thoughts and beliefs.  Patient reported no current problems with medication regimen and discussed best ways to take Hydroxyzine and Trazodone when needed at night to help sleep.  Patient reported sleeping 7-8 hours per night and doing okay with appetite, not indulging in eating more carbs when depressed as she stated doing in the past.  Patient rated her level of depression a 4/5 and anxiety a 6/7 on a scale of 0-10 with 10 being the worst. Patient discussed plans to discuss the way she was feeling about the election with her  husband, admitting it does not seem to affect him the same way, but that they do have similar beliefs and that this may be helpful.  Patient reported plan to continue to attend PHP and to set up continued counseling after as reports this has been a positive experience.  Patient agreed to let this nurse or PHP staff know if she starts feeling worse or any problems with symptoms or medications.

## 2023-04-07 ENCOUNTER — Telehealth (HOSPITAL_COMMUNITY): Payer: Self-pay | Admitting: Licensed Clinical Social Worker

## 2023-04-07 ENCOUNTER — Other Ambulatory Visit (HOSPITAL_COMMUNITY): Payer: BC Managed Care – PPO

## 2023-04-07 ENCOUNTER — Encounter (HOSPITAL_COMMUNITY): Payer: Self-pay

## 2023-04-07 NOTE — Therapy (Signed)
[facility] PARTIAL HOSPITALIZATION PROGRAM [address] [place], [place], [address] Phone: [phone]   Fax:  [phone]  Occupational Therapy Treatment Virtual Visit via Video Note  I connected with [name] on 04/07/23 at  8:00 AM EST by a video enabled telemedicine application and verified that I am speaking with the correct person using two identifiers.  Location: Patient: home Provider: office   I discussed the limitations of evaluation and management by telemedicine and the availability of in person appointments. The patient expressed understanding and agreed to proceed.    The patient was advised to call back or seek an in-person evaluation if the symptoms worsen or if the condition fails to improve as anticipated.  I provided 55 minutes of non-face-to-face time during this encounter.   Patient Details  Name: [name] MRN: [id number] Date of Birth: December 18, 1970 No data recorded  Encounter Date: 04/05/2023   OT End of Session - 04/07/23 0951     Visit Number 5    Number of Visits 20    Date for OT Re-Evaluation 05/02/23    OT Start Time 1200    OT Stop Time 1255    OT Time Calculation (min) 55 min    Activity Tolerance Patient tolerated treatment well             Past Medical History:  Diagnosis Date   Anxiety    Bowel obstruction (HCC)    Depression    Hypertension     Past Surgical History:  Procedure Laterality Date   ABDOMINAL HYSTERECTOMY     CHOLECYSTECTOMY     COLONOSCOPY WITH PROPOFOL N/A 07/07/2022   Procedure: COLONOSCOPY WITH PROPOFOL;  Surgeon: [name], MD;  Location: [facility] ENDOSCOPY;  Service: Gastroenterology;  Laterality: N/A;   ESOPHAGOGASTRODUODENOSCOPY N/A 07/07/2022   Procedure: ESOPHAGOGASTRODUODENOSCOPY (EGD);  Surgeon: [name], MD;  Location: [facility] ENDOSCOPY;  Service: Gastroenterology;  Laterality: N/A;   GASTRIC BYPASS      There were no vitals filed for this visit.   Subjective  Assessment - 04/07/23 0950     Currently in Pain? No/denies    Pain Score 0-No pain                 Group Session:  S: Doing a little bit better today.   O:  Patient participated in a virtual occupational therapy session focusing on five alternative goal-setting methods: CLEAR Goals, OKRs (Objectives and Key Results), Backward Goal Setting, Vision Boards, and [employer]. The therapist introduced each method and facilitated discussions and activities related to applying these strategies. Patients actively contributed to the discussions by sharing personal insights and examples.    A: Patient demonstrated a strong grasp of the alternative goal-setting methods. Their active participation and willingness to share personal experiences indicate a high level of motivation and readiness to incorporate these strategies into their daily routines. This engagement is expected to positively impact their progress toward occupational therapy goals.    P: Continue to attend PHP OT group sessions 5x week for 4 weeks to promote daily structure, social engagement, and opportunities to develop and utilize adaptive strategies to maximize functional performance in preparation for safe transition and integration back into school, work, and the community. Plan to address topic of tbd in next OT group session.                  OT Education - 04/07/23 0950     Education Details Five Alt Goal Setting  Methods    Person(s) Educated Patient    Comprehension Verbalized understanding              OT Short Term Goals - 04/02/23 1736       OT SHORT TERM GOAL #1   Title pt will be educated and independent w/ interventions to improve psychosicial social skills and ADL/iADLs for community re-entry    Time 4    Period Weeks    Status On-going    Target Date 05/02/23      OT SHORT TERM GOAL #2   Title Pt will demonstrate independence w/ grief mgmt in order to engage in successful  community reentry at time of discharge    Status On-going      OT SHORT TERM GOAL #3   Title Pt will demonstrate independence w/ coping skills in order to engage in succesful community re-entry at time of discharge    Status On-going                      Plan - 04/07/23 0951     Psychosocial Skills Coping Strategies;Habits;Interpersonal Interaction;Routines and Behaviors             Patient will benefit from skilled therapeutic intervention in order to improve the following deficits and impairments:       Psychosocial Skills: Coping Strategies, Habits, Interpersonal Interaction, Routines and Behaviors   Visit Diagnosis: Difficulty coping    Problem List Patient Active Problem List   Diagnosis Date Noted   Difficulty coping 03/29/2023   Iron deficiency anemia 02/15/2023   Other fatigue 02/15/2023   Gastroesophageal reflux disease without esophagitis 02/15/2023   Anxiety 07/26/2022   Symptomatic anemia 07/07/2022   Hypertension 10/11/2021   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 [place], [place], [address] Phone: [phone]   Fax:  [phone]  Name: [name] MRN: [id number] Date of Birth: 1970/07/31

## 2023-04-10 ENCOUNTER — Other Ambulatory Visit (HOSPITAL_COMMUNITY): Payer: BC Managed Care – PPO

## 2023-04-10 ENCOUNTER — Other Ambulatory Visit (HOSPITAL_COMMUNITY): Payer: BC Managed Care – PPO | Admitting: Professional

## 2023-04-10 DIAGNOSIS — R4589 Other symptoms and signs involving emotional state: Secondary | ICD-10-CM | POA: Diagnosis not present

## 2023-04-10 DIAGNOSIS — F331 Major depressive disorder, recurrent, moderate: Secondary | ICD-10-CM

## 2023-04-10 DIAGNOSIS — F419 Anxiety disorder, unspecified: Secondary | ICD-10-CM

## 2023-04-10 DIAGNOSIS — F332 Major depressive disorder, recurrent severe without psychotic features: Secondary | ICD-10-CM | POA: Insufficient documentation

## 2023-04-10 DIAGNOSIS — F411 Generalized anxiety disorder: Secondary | ICD-10-CM | POA: Diagnosis not present
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
## 2023-04-11 ENCOUNTER — Other Ambulatory Visit (HOSPITAL_COMMUNITY): Payer: BC Managed Care – PPO | Admitting: Licensed Clinical Social Worker

## 2023-04-11 ENCOUNTER — Other Ambulatory Visit (HOSPITAL_COMMUNITY): Payer: BC Managed Care – PPO

## 2023-04-11 DIAGNOSIS — R4589 Other symptoms and signs involving emotional state: Secondary | ICD-10-CM | POA: Diagnosis not present

## 2023-04-11 DIAGNOSIS — F331 Major depressive disorder, recurrent, moderate: Secondary | ICD-10-CM | POA: Diagnosis not present

## 2023-04-11 DIAGNOSIS — F411 Generalized anxiety disorder: Secondary | ICD-10-CM | POA: Diagnosis not present

## 2023-04-11 DIAGNOSIS — F332 Major depressive disorder, recurrent severe without psychotic features: Secondary | ICD-10-CM

## 2023-04-12 ENCOUNTER — Other Ambulatory Visit (HOSPITAL_COMMUNITY): Payer: BC Managed Care – PPO

## 2023-04-12 ENCOUNTER — Other Ambulatory Visit (HOSPITAL_COMMUNITY): Payer: BC Managed Care – PPO | Admitting: Licensed Clinical Social Worker

## 2023-04-12 DIAGNOSIS — R4589 Other symptoms and signs involving emotional state: Secondary | ICD-10-CM

## 2023-04-12 DIAGNOSIS — F331 Major depressive disorder, recurrent, moderate: Secondary | ICD-10-CM | POA: Diagnosis not present

## 2023-04-12 DIAGNOSIS — F411 Generalized anxiety disorder: Secondary | ICD-10-CM | POA: Diagnosis not present

## 2023-04-12 DIAGNOSIS — F332 Major depressive disorder, recurrent severe without psychotic features: Secondary | ICD-10-CM
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
## 2023-04-12 NOTE — Progress Notes (Signed)
Spoke with patient via Teams video call, used 2 identifiers to correctly identify patient. States that groups are going well, she is learning coping skills that she had forgot when she did the program in the past. Has been stressed today about some things she needs to deal with. On scale 1-10 as 10 being worst she rates depression at 3 and anxiety at 7. Denies SI/HI or AV hallucinations. Denies side effects from medication. Discussed having a horse and how she was bucked off recently. She is having to rebuild her confidence to continue riding. Seemed to enjoy speaking about her horse and how much joy he brings her even thought he is costly. No issue or complaints.

## 2023-04-13 ENCOUNTER — Other Ambulatory Visit (HOSPITAL_COMMUNITY): Payer: BC Managed Care – PPO

## 2023-04-13 ENCOUNTER — Encounter (HOSPITAL_COMMUNITY): Payer: Self-pay

## 2023-04-13 NOTE — Therapy (Signed)
[facility] PARTIAL HOSPITALIZATION PROGRAM [address] [place], [place], [address] Phone: [phone]   Fax:  [phone]  Occupational Therapy Treatment Virtual Visit via Video Note  I connected with [name] on 04/13/23 at  8:00 AM EST by a video enabled telemedicine application and verified that I am speaking with the correct person using two identifiers.  Location: Patient: home Provider: office   I discussed the limitations of evaluation and management by telemedicine and the availability of in person appointments. The patient expressed understanding and agreed to proceed.    The patient was advised to call back or seek an in-person evaluation if the symptoms worsen or if the condition fails to improve as anticipated.  I provided 55 minutes of non-face-to-face time during this encounter.   Patient Details  Name: [name] MRN: [id number] Date of Birth: 10/08/1970 No data recorded  Encounter Date: 04/10/2023   OT End of Session - 04/13/23 1949     Visit Number 6    Number of Visits 20    Date for OT Re-Evaluation 05/02/23    OT Start Time 1200    OT Stop Time 1255    OT Time Calculation (min) 55 min             Past Medical History:  Diagnosis Date   Anxiety    Bowel obstruction (HCC)    Depression    Hypertension     Past Surgical History:  Procedure Laterality Date   ABDOMINAL HYSTERECTOMY     CHOLECYSTECTOMY     COLONOSCOPY WITH PROPOFOL N/A 07/07/2022   Procedure: COLONOSCOPY WITH PROPOFOL;  Surgeon: [name], MD;  Location: [facility] ENDOSCOPY;  Service: Gastroenterology;  Laterality: N/A;   ESOPHAGOGASTRODUODENOSCOPY N/A 07/07/2022   Procedure: ESOPHAGOGASTRODUODENOSCOPY (EGD);  Surgeon: [name], MD;  Location: [facility] ENDOSCOPY;  Service: Gastroenterology;  Laterality: N/A;   GASTRIC BYPASS      There were no vitals filed for this visit.   Subjective Assessment - 04/13/23 1948     Currently in Pain?  No/denies    Pain Score 0-No pain                Group Session:  S: Doing better today.   O: The objective of this presentation is to provide a comprehensive understanding of the concept of "motivation" and its role in human behavior and well-being. The content covers various theories of motivation, including intrinsic and extrinsic motivators, and explores the psychological mechanisms that drive individuals to achieve goals, overcome obstacles, and make decisions. By diving into real-world applications, the presentation aims to offer actionable strategies for enhancing motivation in different life domains, such as work, relationships, and personal growth. Utilizing a multi-disciplinary approach, this presentation integrates insights from psychology, neuroscience, and behavioral economics to present a holistic view of motivation. The objective is not only to educate the audience about the complexities and driving forces behind motivation but also to equip them with practical tools and techniques to improve their own motivation levels. By the end of the presentation, attendees should have a well-rounded understanding of what motivates human actions and how to harness this knowledge for personal and professional betterment.   A: The patient demonstrates a high level of engagement during the session, actively participating in discussions about motivation theories and their applicability to their own life. They show keen interest in learning new strategies to improve their motivation and even offer examples from their own experiences that align with the theories presented.  Their level of self-awareness and willingness to invest in self-improvement suggest that they are well-positioned to benefit from the practical tools and techniques discussed. The patient's ability to articulate their goals and challenges further supports the likelihood of successfully implementing the strategies presented.   P:  Continue to attend PHP OT group sessions 5x week for 4 weeks to promote daily structure, social engagement, and opportunities to develop and utilize adaptive strategies to maximize functional performance in preparation for safe transition and integration back into school, work, and the community. Plan to address topic of pt 4 in next OT group session.                   OT Education - 04/13/23 1949     Education Details Moivation 3              OT Short Term Goals - 04/02/23 1736       OT SHORT TERM GOAL #1   Title pt will be educated and independent w/ interventions to improve psychosicial social skills and ADL/iADLs for community re-entry    Time 4    Period Weeks    Status On-going    Target Date 05/02/23      OT SHORT TERM GOAL #2   Title Pt will demonstrate independence w/ grief mgmt in order to engage in successful community reentry at time of discharge    Status On-going      OT SHORT TERM GOAL #3   Title Pt will demonstrate independence w/ coping skills in order to engage in succesful community re-entry at time of discharge    Status On-going                      Plan - 04/13/23 1949     Psychosocial Skills Coping Strategies;Habits;Interpersonal Interaction;Routines and Behaviors             Patient will benefit from skilled therapeutic intervention in order to improve the following deficits and impairments:       Psychosocial Skills: Coping Strategies, Habits, Interpersonal Interaction, Routines and Behaviors   Visit Diagnosis: Difficulty coping    Problem List Patient Active Problem List   Diagnosis Date Noted   MDD (major depressive disorder), recurrent episode, severe (HCC) 04/10/2023   Difficulty coping 03/29/2023   Iron deficiency anemia 02/15/2023   Other fatigue 02/15/2023   Gastroesophageal reflux disease without esophagitis 02/15/2023   Anxiety 07/26/2022   Symptomatic anemia 07/07/2022   Hypertension  10/11/2021   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   Moderate episode of recurrent major depressive disorder (HCC) 09/30/2021    Ted Mcalpine, OT 04/13/2023, 7:49 PM  Kerrin Champagne, OT   [facility] HOSPITALIZATION PROGRAM [address] [place], [place], [address] Phone: [phone]   Fax:  [phone]  Name: Mylissa Staub MRN: [id number] Date of Birth: Oct 26, 1970

## 2023-04-13 NOTE — Therapy (Signed)
[facility] PARTIAL HOSPITALIZATION PROGRAM [address] [place], [place], [address] Phone: [phone]   Fax:  [phone]  Occupational Therapy Treatment Virtual Visit via Video Note  I connected with [name] on 04/13/23 at  8:00 AM EST by a video enabled telemedicine application and verified that I am speaking with the correct person using two identifiers.  Location: Patient: home Provider: office   I discussed the limitations of evaluation and management by telemedicine and the availability of in person appointments. The patient expressed understanding and agreed to proceed.    The patient was advised to call back or seek an in-person evaluation if the symptoms worsen or if the condition fails to improve as anticipated.  I provided 55 minutes of non-face-to-face time during this encounter.  Patient Details  Name: [name] MRN: [id number] Date of Birth: 1970/07/31 No data recorded  Encounter Date: 04/11/2023   OT End of Session - 04/13/23 1958     Visit Number 7    Number of Visits 20    Date for OT Re-Evaluation 05/02/23    OT Start Time 1200    OT Stop Time 1255    OT Time Calculation (min) 55 min             Past Medical History:  Diagnosis Date   Anxiety    Bowel obstruction (HCC)    Depression    Hypertension     Past Surgical History:  Procedure Laterality Date   ABDOMINAL HYSTERECTOMY     CHOLECYSTECTOMY     COLONOSCOPY WITH PROPOFOL N/A 07/07/2022   Procedure: COLONOSCOPY WITH PROPOFOL;  Surgeon: [name], MD;  Location: [facility] ENDOSCOPY;  Service: Gastroenterology;  Laterality: N/A;   ESOPHAGOGASTRODUODENOSCOPY N/A 07/07/2022   Procedure: ESOPHAGOGASTRODUODENOSCOPY (EGD);  Surgeon: [name], MD;  Location: [facility] ENDOSCOPY;  Service: Gastroenterology;  Laterality: N/A;   GASTRIC BYPASS      There were no vitals filed for this visit.   Subjective Assessment - 04/13/23 1957     Currently in Pain?  No/denies    Pain Score 0-No pain                Group Session:  S: Feeling better today   O: The objective of this presentation is to provide a comprehensive understanding of the concept of "motivation" and its role in human behavior and well-being. The content covers various theories of motivation, including intrinsic and extrinsic motivators, and explores the psychological mechanisms that drive individuals to achieve goals, overcome obstacles, and make decisions. By diving into real-world applications, the presentation aims to offer actionable strategies for enhancing motivation in different life domains, such as work, relationships, and personal growth. Utilizing a multi-disciplinary approach, this presentation integrates insights from psychology, neuroscience, and behavioral economics to present a holistic view of motivation. The objective is not only to educate the audience about the complexities and driving forces behind motivation but also to equip them with practical tools and techniques to improve their own motivation levels. By the end of the presentation, attendees should have a well-rounded understanding of what motivates human actions and how to harness this knowledge for personal and professional betterment.   A:   The patient demonstrates a high level of engagement during the session, actively participating in discussions about motivation theories and their applicability to their own life. They show keen interest in learning new strategies to improve their motivation and even offer examples from their own experiences that align with the theories  presented. Their level of self-awareness and willingness to invest in self-improvement suggest that they are well-positioned to benefit from the practical tools and techniques discussed. The patient's ability to articulate their goals and challenges further supports the likelihood of successfully implementing the strategies  presented.    P: Continue to attend PHP OT group sessions 5x week for 4 weeks to promote daily structure, social engagement, and opportunities to develop and utilize adaptive strategies to maximize functional performance in preparation for safe transition and integration back into school, work, and the community. Plan to address topic of tbd in next OT group session.                   OT Education - 04/13/23 1957     Education Details Moivation              OT Short Term Goals - 04/02/23 1736       OT SHORT TERM GOAL #1   Title pt will be educated and independent w/ interventions to improve psychosicial social skills and ADL/iADLs for community re-entry    Time 4    Period Weeks    Status On-going    Target Date 05/02/23      OT SHORT TERM GOAL #2   Title Pt will demonstrate independence w/ grief mgmt in order to engage in successful community reentry at time of discharge    Status On-going      OT SHORT TERM GOAL #3   Title Pt will demonstrate independence w/ coping skills in order to engage in succesful community re-entry at time of discharge    Status On-going                      Plan - 04/13/23 1958     Psychosocial Skills Coping Strategies;Habits;Interpersonal Interaction;Routines and Behaviors             Patient will benefit from skilled therapeutic intervention in order to improve the following deficits and impairments:       Psychosocial Skills: Coping Strategies, Habits, Interpersonal Interaction, Routines and Behaviors   Visit Diagnosis: Difficulty coping    Problem List Patient Active Problem List   Diagnosis Date Noted   MDD (major depressive disorder), recurrent episode, severe (HCC) 04/10/2023   Difficulty coping 03/29/2023   Iron deficiency anemia 02/15/2023   Other fatigue 02/15/2023   Gastroesophageal reflux disease without esophagitis 02/15/2023   Anxiety 07/26/2022   Symptomatic anemia 07/07/2022    Hypertension 10/11/2021   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   Moderate episode of recurrent major depressive disorder (HCC) 09/30/2021    Ted Mcalpine, OT 04/13/2023, 7:58 PM  Kerrin Champagne, OT   [facility] HOSPITALIZATION PROGRAM [address] [place], [place], [address] Phone: [phone]   Fax:  [phone]  Name: [name] MRN: [id number] Date of Birth: [birth year]

## 2023-04-14 ENCOUNTER — Other Ambulatory Visit (HOSPITAL_COMMUNITY): Payer: BC Managed Care – PPO | Admitting: Licensed Clinical Social Worker

## 2023-04-14 ENCOUNTER — Other Ambulatory Visit (HOSPITAL_COMMUNITY): Payer: BC Managed Care – PPO

## 2023-04-14 DIAGNOSIS — F332 Major depressive disorder, recurrent severe without psychotic features: Secondary | ICD-10-CM

## 2023-04-14 DIAGNOSIS — F411 Generalized anxiety disorder: Secondary | ICD-10-CM | POA: Diagnosis not present

## 2023-04-14 DIAGNOSIS — R4589 Other symptoms and signs involving emotional state: Secondary | ICD-10-CM | POA: Diagnosis not present

## 2023-04-14 DIAGNOSIS — F331 Major depressive disorder, recurrent, moderate: Secondary | ICD-10-CM | POA: Diagnosis not present

## 2023-04-17 ENCOUNTER — Other Ambulatory Visit (HOSPITAL_COMMUNITY): Payer: BC Managed Care – PPO

## 2023-04-17 ENCOUNTER — Other Ambulatory Visit (HOSPITAL_COMMUNITY): Payer: BC Managed Care – PPO | Admitting: Licensed Clinical Social Worker

## 2023-04-17 DIAGNOSIS — F331 Major depressive disorder, recurrent, moderate: Secondary | ICD-10-CM | POA: Diagnosis not present

## 2023-04-17 DIAGNOSIS — R4589 Other symptoms and signs involving emotional state: Secondary | ICD-10-CM | POA: Diagnosis not present

## 2023-04-17 DIAGNOSIS — F411 Generalized anxiety disorder: Secondary | ICD-10-CM | POA: Diagnosis not present

## 2023-04-17 DIAGNOSIS — F332 Major depressive disorder, recurrent severe without psychotic features: Secondary | ICD-10-CM

## 2023-04-18 ENCOUNTER — Other Ambulatory Visit (HOSPITAL_COMMUNITY): Payer: BC Managed Care – PPO | Admitting: Licensed Clinical Social Worker

## 2023-04-18 ENCOUNTER — Encounter (HOSPITAL_COMMUNITY): Payer: Self-pay | Admitting: Family

## 2023-04-18 ENCOUNTER — Other Ambulatory Visit (HOSPITAL_COMMUNITY): Payer: BC Managed Care – PPO

## 2023-04-18 DIAGNOSIS — R4589 Other symptoms and signs involving emotional state: Secondary | ICD-10-CM

## 2023-04-18 DIAGNOSIS — F419 Anxiety disorder, unspecified: Secondary | ICD-10-CM

## 2023-04-18 DIAGNOSIS — F331 Major depressive disorder, recurrent, moderate: Secondary | ICD-10-CM | POA: Diagnosis not present

## 2023-04-18 DIAGNOSIS — F332 Major depressive disorder, recurrent severe without psychotic features: Secondary | ICD-10-CM

## 2023-04-18 DIAGNOSIS — F411 Generalized anxiety disorder: Secondary | ICD-10-CM | POA: Diagnosis not present
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
## 2023-04-19 ENCOUNTER — Other Ambulatory Visit (HOSPITAL_BASED_OUTPATIENT_CLINIC_OR_DEPARTMENT_OTHER): Payer: BC Managed Care – PPO | Admitting: Licensed Clinical Social Worker

## 2023-04-19 ENCOUNTER — Encounter (HOSPITAL_COMMUNITY): Payer: Self-pay

## 2023-04-19 ENCOUNTER — Other Ambulatory Visit (HOSPITAL_COMMUNITY): Payer: BC Managed Care – PPO

## 2023-04-19 DIAGNOSIS — F332 Major depressive disorder, recurrent severe without psychotic features: Secondary | ICD-10-CM

## 2023-04-19 NOTE — Therapy (Signed)
[facility] PARTIAL HOSPITALIZATION PROGRAM [address] [place], [place], [address] Phone: [phone]   Fax:  [phone]  Occupational Therapy Treatment Virtual Visit via Video Note  I connected with [name] on 04/19/23 at  8:00 AM EST by a video enabled telemedicine application and verified that I am speaking with the correct person using two identifiers.  Location: Patient: home Provider: office   I discussed the limitations of evaluation and management by telemedicine and the availability of in person appointments. The patient expressed understanding and agreed to proceed.    The patient was advised to call back or seek an in-person evaluation if the symptoms worsen or if the condition fails to improve as anticipated.  I provided 55 minutes of non-face-to-face time during this encounter.  Patient Details  Name: [name] MRN: [id number] Date of Birth: December 24, 1970 No data recorded  Encounter Date: 04/14/2023   OT End of Session - 04/19/23 0927     Visit Number 9    Number of Visits 20    Date for OT Re-Evaluation 05/02/23    OT Start Time 1200    OT Stop Time 1255    OT Time Calculation (min) 55 min             Past Medical History:  Diagnosis Date   Anxiety    Bowel obstruction (HCC)    Depression    Hypertension     Past Surgical History:  Procedure Laterality Date   ABDOMINAL HYSTERECTOMY     CHOLECYSTECTOMY     COLONOSCOPY WITH PROPOFOL N/A 07/07/2022   Procedure: COLONOSCOPY WITH PROPOFOL;  Surgeon: [name], MD;  Location: [facility] ENDOSCOPY;  Service: Gastroenterology;  Laterality: N/A;   ESOPHAGOGASTRODUODENOSCOPY N/A 07/07/2022   Procedure: ESOPHAGOGASTRODUODENOSCOPY (EGD);  Surgeon: [name], MD;  Location: [facility] ENDOSCOPY;  Service: Gastroenterology;  Laterality: N/A;   GASTRIC BYPASS      There were no vitals filed for this visit.   Subjective Assessment - 04/19/23 0927     Currently in Pain?  No/denies    Pain Score 0-No pain                Group Session:  S: Doing better today.   O: In this communication group therapy session, facilitated by an occupational therapist, participants explored several key subtopics aimed at enhancing their interpersonal skills. The session began with a discussion on the use of "I" and "AND" statements, emphasizing personal responsibility and constructive language in expressing feelings and needs. Participants then practiced active listening techniques to improve their ability to fully understand and respond to others. The group also delved into assertive communication, learning how to express themselves confidently and respectfully. Emotional regulation skills were addressed, providing strategies for managing emotions during interactions. Social skills training included role-playing scenarios to build confidence in various social contexts. Feedback and reflection were integral parts of the session, with participants offering and receiving constructive feedback to foster personal growth. The group identified common barriers to effective communication, such as anxiety, fear of judgment, and past negative experiences, and discussed strategies to overcome these challenges, including mindfulness practices and building a supportive network.   A: The patient actively participated in the group therapy session, demonstrating a high level of engagement and enthusiasm. They contributed to discussions on "I" and "AND" statements by sharing personal examples and insights. During the active listening exercises, the patient was attentive and provided thoughtful feedback to peers. They effectively practiced assertive communication techniques and showed  a clear understanding of emotional regulation strategies. In social skills role-playing, the patient was confident and responsive, indicating a good grasp of the concepts. The patient also engaged in the feedback and  reflection segment, offering constructive comments and showing receptivity to feedback from others. Their proactive approach and willingness to explore personal barriers to communication suggest significant progress and motivation to improve interpersonal skills.     P: Continue to attend PHP OT group sessions 5x week for 4 weeks to promote daily structure, social engagement, and opportunities to develop and utilize adaptive strategies to maximize functional performance in preparation for safe transition and integration back into school, work, and the community. Plan to address topic of pt 2 in next OT group session.                   OT Education - 04/19/23 0927     Education Details Communication              OT Short Term Goals - 04/02/23 1736       OT SHORT TERM GOAL #1   Title pt will be educated and independent w/ interventions to improve psychosicial social skills and ADL/iADLs for community re-entry    Time 4    Period Weeks    Status On-going    Target Date 05/02/23      OT SHORT TERM GOAL #2   Title Pt will demonstrate independence w/ grief mgmt in order to engage in successful community reentry at time of discharge    Status On-going      OT SHORT TERM GOAL #3   Title Pt will demonstrate independence w/ coping skills in order to engage in succesful community re-entry at time of discharge    Status On-going                      Plan - 04/19/23 0927     Psychosocial Skills Coping Strategies;Habits;Interpersonal Interaction;Routines and Behaviors             Patient will benefit from skilled therapeutic intervention in order to improve the following deficits and impairments:       Psychosocial Skills: Coping Strategies, Habits, Interpersonal Interaction, Routines and Behaviors   Visit Diagnosis: Difficulty coping    Problem List Patient Active Problem List   Diagnosis Date Noted   MDD (major depressive disorder),  recurrent episode, severe (HCC) 04/10/2023   Difficulty coping 03/29/2023   Iron deficiency anemia 02/15/2023   Other fatigue 02/15/2023   Gastroesophageal reflux disease without esophagitis 02/15/2023   Anxiety 07/26/2022   Symptomatic anemia 07/07/2022   Hypertension 10/11/2021   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   Moderate episode of recurrent major depressive disorder (HCC) 09/30/2021    Ted Mcalpine, OT 04/19/2023, 9:28 AM  Kerrin Champagne, OT   [facility] HOSPITALIZATION PROGRAM [address] [place], [place], [address] Phone: [phone]   Fax:  [phone]  Name: [name] MRN: [id number] Date of Birth: April 25, 1971

## 2023-04-19 NOTE — Therapy (Signed)
[facility] PARTIAL HOSPITALIZATION PROGRAM [address] [place], [place], [address] Phone: [phone]   Fax:  [phone]  Occupational Therapy Treatment Virtual Visit via Video Note  I connected with [name] on 04/19/23 at  8:00 AM EST by a video enabled telemedicine application and verified that I am speaking with the correct person using two identifiers.  Location: Patient: home Provider: office   I discussed the limitations of evaluation and management by telemedicine and the availability of in person appointments. The patient expressed understanding and agreed to proceed.    The patient was advised to call back or seek an in-person evaluation if the symptoms worsen or if the condition fails to improve as anticipated.  I provided 55 minutes of non-face-to-face time during this encounter.   Patient Details  Name: [name] MRN: [id number] Date of Birth: Dec 07, 1970 No data recorded  Encounter Date: 04/12/2023   OT End of Session - 04/19/23 0852     Visit Number 8    Number of Visits 20    Date for OT Re-Evaluation 05/02/23    OT Start Time 1200    OT Stop Time 1255    OT Time Calculation (min) 55 min             Past Medical History:  Diagnosis Date   Anxiety    Bowel obstruction (HCC)    Depression    Hypertension     Past Surgical History:  Procedure Laterality Date   ABDOMINAL HYSTERECTOMY     CHOLECYSTECTOMY     COLONOSCOPY WITH PROPOFOL N/A 07/07/2022   Procedure: COLONOSCOPY WITH PROPOFOL;  Surgeon: [name], MD;  Location: [facility] ENDOSCOPY;  Service: Gastroenterology;  Laterality: N/A;   ESOPHAGOGASTRODUODENOSCOPY N/A 07/07/2022   Procedure: ESOPHAGOGASTRODUODENOSCOPY (EGD);  Surgeon: [name], MD;  Location: [facility] ENDOSCOPY;  Service: Gastroenterology;  Laterality: N/A;   GASTRIC BYPASS      There were no vitals filed for this visit.   Subjective Assessment - 04/19/23 0850     Currently in Pain?  No/denies    Pain Score 0-No pain                Group Session:  S: Feeling better today.   O: The primary objective of this group therapy session is to equip participants with practical strategies and coping mechanisms to effectively manage accumulated tasks that seem insurmountable due to mental health challenges such as depression and anxiety. The group aims to build a supportive environment wherein individuals can openly discuss their struggles, share experiences, and learn from one another. The session's strategies will encompass goal setting, time management, task breakdown, creating conducive environments, and mindfulness practices. We aim to help participants perceive tasks as manageable units rather than overwhelming piles and encourage an approach of progress over perfection.   A: Patient demonstrated active engagement throughout the session. They contributed valuable insights during discussions, asked relevant questions, and showed enthusiasm towards learning new strategies. Their interaction with other group members was respectful and empathetic, contributing to a supportive group dynamic. Patient appeared to resonate with the strategies of "breaking it down" and "creating a conducive environment," and they shared plans to incorporate these strategies into their daily routine. Their active participation, willingness to share personal experiences, and acceptance of new strategies demonstrate a strong benefit from this therapy session.    P: Continue to attend PHP OT group sessions 5x week for 4 weeks to promote daily structure, social engagement, and opportunities to develop  and utilize adaptive strategies to maximize functional performance in preparation for safe transition and integration back into school, work, and the community. Plan to address topic of pt 2 in next OT group session.                   OT Education - 04/19/23 0850     Education Details Task  Accumulation              OT Short Term Goals - 04/02/23 1736       OT SHORT TERM GOAL #1   Title pt will be educated and independent w/ interventions to improve psychosicial social skills and ADL/iADLs for community re-entry    Time 4    Period Weeks    Status On-going    Target Date 05/02/23      OT SHORT TERM GOAL #2   Title Pt will demonstrate independence w/ grief mgmt in order to engage in successful community reentry at time of discharge    Status On-going      OT SHORT TERM GOAL #3   Title Pt will demonstrate independence w/ coping skills in order to engage in succesful community re-entry at time of discharge    Status On-going                      Plan - 04/19/23 0853     Psychosocial Skills Coping Strategies;Habits;Interpersonal Interaction;Routines and Behaviors             Patient will benefit from skilled therapeutic intervention in order to improve the following deficits and impairments:       Psychosocial Skills: Coping Strategies, Habits, Interpersonal Interaction, Routines and Behaviors   Visit Diagnosis: Difficulty coping    Problem List Patient Active Problem List   Diagnosis Date Noted   MDD (major depressive disorder), recurrent episode, severe (HCC) 04/10/2023   Difficulty coping 03/29/2023   Iron deficiency anemia 02/15/2023   Other fatigue 02/15/2023   Gastroesophageal reflux disease without esophagitis 02/15/2023   Anxiety 07/26/2022   Symptomatic anemia 07/07/2022   Hypertension 10/11/2021   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   Moderate episode of recurrent major depressive disorder (HCC) 09/30/2021    [name], OT 04/19/2023, 8:53 AM [name], OT  [facility] HOSPITALIZATION PROGRAM [address] [place], [place], [address] Phone: [phone]   Fax:  [phone]  Name: [name] MRN: [id number] Date of Birth: January 10, 1971

## 2023-04-20 ENCOUNTER — Other Ambulatory Visit (HOSPITAL_COMMUNITY): Payer: BC Managed Care – PPO

## 2023-04-20 NOTE — Psych (Signed)
Virtual Visit via Video Note  I connected with [name] on 04/03/23 at  9:00 AM EST by a video enabled telemedicine application and verified that I am speaking with the correct person using two identifiers.  Location: Patient: Patient's home Provider: Clinical Home office   I discussed the limitations of evaluation and management by telemedicine and the availability of in person appointments. The patient expressed understanding and agreed to proceed.  I discussed the assessment and treatment plan with the patient. The patient was provided an opportunity to ask questions and all were answered. The patient agreed with the plan and demonstrated an understanding of the instructions.   The patient was advised to call back or seek an in-person evaluation if the symptoms worsen or if the condition fails to improve as anticipated.  Pt was provided 240 minutes of non-face-to-face time during this encounter.   Donia Guiles, LCSW   [facility] PHP THERAPIST PROGRESS NOTE  [name] [id number]  Session Time: 9:00 - 10:00  Participation Level: Active  Behavioral Response: CasualAlertAnxious and Depressed  Type of Therapy: Group Therapy  Treatment Goals addressed: Coping  Progress Towards Goals: Progressing  Interventions: CBT, DBT, Solution Focused, Strength-based, Supportive, and Reframing  Summary: [name] is a 52 y.o. female who presents with anxiety and depression symptoms. Clinician led check-in regarding current stressors and situation, and review of patient completed daily inventory. Clinician utilized active listening and empathetic response and validated patient emotions. Clinician facilitated processing group on pertinent issues.?    Therapist Response:  Patient arrived within time allowed. Patient rates her mood at a 5 on a scale of 1-10 with 10 being best. Pt states she feels "kind of anxious." Pt states she slept 6 broken hours and ate 2x. Pt reports  feeling anxious about completing responsibilities and feeling overwhelmed. Pt continues to put a lot of pressure on herself and struggle to exhibit kindness to herself. Patient able to process. Patient engagement was limited.          Session Time: 10:00 am - 11:00 am   Participation Level: Active   Behavioral Response: CasualAlertDepressed   Type of Therapy: Group Therapy   Treatment Goals addressed: Coping   Progress Towards Goals: Progressing   Interventions: CBT, DBT, Solution Focused, Strength-based, Supportive, and Reframing   Therapist Response: Cln led processing group for pt's current struggles. Group members shared stressors and provided support and feedback. Cln brought in topics of boundaries, healthy relationships, and unhealthy thought processes to inform discussion.    Therapist Response: Pt able to process and provide support to group.          Session Time: 11:00 -12:00   Participation Level: Active   Behavioral Response: CasualAlertDepressed   Type of Therapy: Group Therapy   Treatment Goals addressed: Coping   Progress Towards Goals: Progressing   Interventions: CBT, DBT, Solution Focused, Strength-based, Supportive, and Reframing   Summary: Cln introduced topic of boundaries. Cln discussed how boundaries inform our relationships and affect self-esteem and personal agency. Group discussed the three types of boundaries: rigid, porous, and healthy and when each type is most helpful/harmful.     Therapist Response: Pt engaged in discussion and is able to identify each type in their life.         Session Time: 12:00 -1:00   Participation Level: Active   Behavioral Response: CasualAlertDepressed   Type of Therapy: Group therapy   Treatment Goals addressed: Coping   Progress Towards Goals: Progressing   Interventions: OT  group   Summary: 12:00 - 12:50: Occupational Therapy group with cln E. Hollan.  12:50 - 1:00 Clinician assessed for  immediate needs, medication compliance and efficacy, and safety concerns.   Therapist Response: 12:00 - 12:50: See note 12:50 - 1:00 pm: At check-out, patient reports no immediate concerns. Patient demonstrates progress as evidenced by continued engagement and responsiveness to treatment. Patient denies SI/HI/self-harm thoughts at the end of group.       Suicidal/Homicidal: Nowithout intent/plan   Plan: Pt will continue in PHP while working to decrease depression and anxiety symptoms, increase daily functioning, and increase ability to manage symptoms in a healthy manner.     Collaboration of Care: Medication Management AEB C. Cosby, MD  Patient/Guardian was advised Release of Information must be obtained prior to any record release in order to collaborate their care with an outside provider. Patient/Guardian was advised if they have not already done so to contact the registration department to sign all necessary forms in order for [place] to release information regarding their care.   Consent: Patient/Guardian gives verbal consent for treatment and assignment of benefits for services provided during this visit. Patient/Guardian expressed understanding and agreed to proceed.   Diagnosis: Severe episode of recurrent major depressive disorder, without psychotic features (HCC) [F33.2]    1. Severe episode of recurrent major depressive disorder, without psychotic features (HCC)       Donia Guiles, LCSW

## 2023-04-20 NOTE — Psych (Signed)
Virtual Visit via Video Note  I connected with [name] on 03/31/23 at  9:00 AM EDT by a video enabled telemedicine application and verified that I am speaking with the correct person using two identifiers.  Location: Patient: Patient's home Provider: Clinical Home office   I discussed the limitations of evaluation and management by telemedicine and the availability of in person appointments. The patient expressed understanding and agreed to proceed.  I discussed the assessment and treatment plan with the patient. The patient was provided an opportunity to ask questions and all were answered. The patient agreed with the plan and demonstrated an understanding of the instructions.   The patient was advised to call back or seek an in-person evaluation if the symptoms worsen or if the condition fails to improve as anticipated.  Pt was provided 240 minutes of non-face-to-face time during this encounter.   Anna Guiles, LCSW   [facility] PHP THERAPIST PROGRESS NOTE  [name] [id number]  Session Time: 9:00 - 10:00  Participation Level: Active  Behavioral Response: CasualAlertAnxious and Depressed  Type of Therapy: Group Therapy  Treatment Goals addressed: Coping  Progress Towards Goals: Initial  Interventions: CBT, DBT, Solution Focused, Strength-based, Supportive, and Reframing  Summary: [name] is a 52 y.o. female who presents with anxiety and depression symptoms. Clinician led check-in regarding current stressors and situation, and review of patient completed daily inventory. Clinician utilized active listening and empathetic response and validated patient emotions. Clinician facilitated processing group on pertinent issues.?    Therapist Response:  Patient arrived within time allowed. Patient rates her mood at a 5 on a scale of 1-10 with 10 being best. Pt states she feels "irritable." Pt states she slept 6 broken hours and ate 2x. Pt reports she has an  interview this afternoon that she is anxious about and that thinking of work in general increases her stress. Pt shares difficulty with allowing herself rest. Patient able to process. Patient engagement was limited.          Session Time: 10:00 am - 11:00 am   Participation Level: Active   Behavioral Response: CasualAlertDepressed   Type of Therapy: Group Therapy   Treatment Goals addressed: Coping   Progress Towards Goals: Progressing   Interventions: CBT, DBT, Solution Focused, Strength-based, Supportive, and Reframing   Therapist Response: Anna led discussion on CBT thinking error: personalization. Anna drew connection between personalizing thoughts to increased self conciousness and lower self esteem.  Group discussed struggled with personalizing and created reframing statements.    Therapist Response:  Pt engaged in discussion and reports understanding of personalization.          Session Time: 11:00 -12:00   Participation Level: Active   Behavioral Response: CasualAlertDepressed   Type of Therapy: Group Therapy   Treatment Goals addressed: Coping   Progress Towards Goals: Progressing   Interventions: CBT, DBT, Solution Focused, Strength-based, Supportive, and Reframing   Summary: Anna led discussion on ways to manage stressors and feelings over the weekend. Group members  brainstormed things to do over the weekend for multiple levels of energy, access, and moods. Anna reviewed crisis services should they be needed and provided pt's with the text crisis line, mobile crisis, national suicide hotline, [facility] 24/7 line, and information on [facility].      Therapist Response: Pt engaged in discussion and is able to identify 3 ideas of what to do over the weekend to keep their mind engaged.  Session Time: 12:00 -1:00   Participation Level: Active   Behavioral Response: CasualAlertDepressed   Type of Therapy: Group therapy   Treatment Goals addressed:  Coping   Progress Towards Goals: Progressing   Interventions: OT group   Summary: 12:00 - 12:50: Occupational Therapy group with [name].  12:50 - 1:00 Clinician assessed for immediate needs, medication compliance and efficacy, and safety concerns.   Therapist Response: 12:00 - 12:50: See note 12:50 - 1:00 pm: At check-out, patient reports no immediate concerns. Patient demonstrates progress as evidenced by continued engagement and responsiveness to treatment. Patient denies SI/HI/self-harm thoughts at the end of group.       Suicidal/Homicidal: Nowithout intent/plan   Plan: Pt will continue in PHP while working to decrease depression and anxiety symptoms, increase daily functioning, and increase ability to manage symptoms in a healthy manner.     Collaboration of Care: Medication Management AEB C. Cosby, MD  Patient/Guardian was advised Release of Information must be obtained prior to any record release in order to collaborate their care with an outside provider. Patient/Guardian was advised if they have not already done so to contact the registration department to sign all necessary forms in order for [place] to release information regarding their care.   Consent: Patient/Guardian gives verbal consent for treatment and assignment of benefits for services provided during this visit. Patient/Guardian expressed understanding and agreed to proceed.   Diagnosis: Severe episode of recurrent major depressive disorder, without psychotic features (HCC) [F33.2]    1. Severe episode of recurrent major depressive disorder, without psychotic features (HCC)       Anna Guiles, LCSW

## 2023-04-21 ENCOUNTER — Other Ambulatory Visit (HOSPITAL_COMMUNITY): Payer: BC Managed Care – PPO

## 2023-04-21 ENCOUNTER — Encounter (HOSPITAL_COMMUNITY): Payer: Self-pay

## 2023-04-21 NOTE — Therapy (Signed)
[facility] PARTIAL HOSPITALIZATION PROGRAM [address] [place], [place], [address] Phone: [phone]   Fax:  [phone]  Occupational Therapy Treatment Virtual Visit via Video Note  I connected with [name] on 04/21/23 at  8:00 AM EST by a video enabled telemedicine application and verified that I am speaking with the correct person using two identifiers.  Location: Patient: home Provider: office   I discussed the limitations of evaluation and management by telemedicine and the availability of in person appointments. The patient expressed understanding and agreed to proceed.    The patient was advised to call back or seek an in-person evaluation if the symptoms worsen or if the condition fails to improve as anticipated.  I provided 55 minutes of non-face-to-face time during this encounter.   Patient Details  Name: [name] MRN: [id number] Date of Birth: August 28, 1970 No data recorded  Encounter Date: 04/18/2023   OT End of Session - 04/21/23 0955     Visit Number 10    Number of Visits 20    Date for OT Re-Evaluation 05/02/23    OT Start Time 1200    OT Stop Time 1255    OT Time Calculation (min) 55 min             Past Medical History:  Diagnosis Date   Anxiety    Bowel obstruction (HCC)    Depression    Hypertension     Past Surgical History:  Procedure Laterality Date   ABDOMINAL HYSTERECTOMY     CHOLECYSTECTOMY     COLONOSCOPY WITH PROPOFOL N/A 07/07/2022   Procedure: COLONOSCOPY WITH PROPOFOL;  Surgeon: [name], MD;  Location: [facility] ENDOSCOPY;  Service: Gastroenterology;  Laterality: N/A;   ESOPHAGOGASTRODUODENOSCOPY N/A 07/07/2022   Procedure: ESOPHAGOGASTRODUODENOSCOPY (EGD);  Surgeon: [name], MD;  Location: [facility] ENDOSCOPY;  Service: Gastroenterology;  Laterality: N/A;   GASTRIC BYPASS      There were no vitals filed for this visit.   Subjective Assessment - 04/21/23 0955     Currently in Pain?  No/denies    Pain Score 0-No pain                 Group Session:  S: Doing better today and I'm optimistic for the future. I feel like I have learned a lot here and will use these tools going forward.   O: In this communication group therapy session, facilitated by an occupational therapist, participants explored several key subtopics aimed at enhancing their interpersonal skills. The session began with a discussion on the use of "I" and "AND" statements, emphasizing personal responsibility and constructive language in expressing feelings and needs. Participants then practiced active listening techniques to improve their ability to fully understand and respond to others. The group also delved into assertive communication, learning how to express themselves confidently and respectfully. Emotional regulation skills were addressed, providing strategies for managing emotions during interactions. Social skills training included role-playing scenarios to build confidence in various social contexts. Feedback and reflection were integral parts of the session, with participants offering and receiving constructive feedback to foster personal growth. The group identified common barriers to effective communication, such as anxiety, fear of judgment, and past negative experiences, and discussed strategies to overcome these challenges, including mindfulness practices and building a supportive network.   A: The patient actively participated in the group therapy session, demonstrating a high level of engagement and enthusiasm. They contributed to discussions on "I" and "AND" statements by sharing personal examples and  insights. During the active listening exercises, the patient was attentive and provided thoughtful feedback to peers. They effectively practiced assertive communication techniques and showed a clear understanding of emotional regulation strategies. In social skills role-playing, the patient was  confident and responsive, indicating a good grasp of the concepts. The patient also engaged in the feedback and reflection segment, offering constructive comments and showing receptivity to feedback from others. Their proactive approach and willingness to explore personal barriers to communication suggest significant progress and motivation to improve interpersonal skills.   OCCUPATIONAL THERAPY DISCHARGE SUMMARY  Visits from Start of Care: 10  Current functional level related to goals / functional outcomes: Pt has met all OT goals and is ready for discharge at this time.    Remaining deficits: There are no remaining OT deficits at this time.     Plan: Patient agrees to discharge.                      OT Education - 04/21/23 0955     Education Details Communication 2              OT Short Term Goals - 04/02/23 1736       OT SHORT TERM GOAL #1   Title pt will be educated and independent w/ interventions to improve psychosicial social skills and ADL/iADLs for community re-entry    Time 4    Period Weeks    Status On-going    Target Date 05/02/23      OT SHORT TERM GOAL #2   Title Pt will demonstrate independence w/ grief mgmt in order to engage in successful community reentry at time of discharge    Status On-going      OT SHORT TERM GOAL #3   Title Pt will demonstrate independence w/ coping skills in order to engage in succesful community re-entry at time of discharge    Status On-going                      Plan - 04/21/23 0955     Psychosocial Skills Coping Strategies;Habits;Interpersonal Interaction;Routines and Behaviors             Patient will benefit from skilled therapeutic intervention in order to improve the following deficits and impairments:       Psychosocial Skills: Coping Strategies, Habits, Interpersonal Interaction, Routines and Behaviors   Visit Diagnosis: Difficulty coping    Problem List Patient Active  Problem List   Diagnosis Date Noted   MDD (major depressive disorder), recurrent episode, severe (HCC) 04/10/2023   Difficulty coping 03/29/2023   Iron deficiency anemia 02/15/2023   Other fatigue 02/15/2023   Gastroesophageal reflux disease without esophagitis 02/15/2023   Anxiety 07/26/2022   Symptomatic anemia 07/07/2022   Hypertension 10/11/2021   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

## 2023-04-22 NOTE — Psych (Signed)
Virtual Visit via Video Note  I connected with [name] on 04/06/23 at  9:00 AM EST by a video enabled telemedicine application and verified that I am speaking with the correct person using two identifiers.  Location: Patient: Patient's home Provider: Clinical Home office   I discussed the limitations of evaluation and management by telemedicine and the availability of in person appointments. The patient expressed understanding and agreed to proceed.  I discussed the assessment and treatment plan with the patient. The patient was provided an opportunity to ask questions and all were answered. The patient agreed with the plan and demonstrated an understanding of the instructions.   The patient was advised to call back or seek an in-person evaluation if the symptoms worsen or if the condition fails to improve as anticipated.  Pt was provided 180 minutes of non-face-to-face time during this encounter.   Donia Guiles, LCSW   [facility] PHP THERAPIST PROGRESS NOTE  Mozel Psencik [id number]  Session Time: 9:00 - 10:00  Participation Level: Active  Behavioral Response: CasualAlertAnxious and Depressed  Type of Therapy: Group Therapy  Treatment Goals addressed: Coping  Progress Towards Goals: Progressing  Interventions: CBT, DBT, Solution Focused, Strength-based, Supportive, and Reframing  Summary: [name] is a 52 y.o. female who presents with anxiety and depression symptoms. Clinician led check-in regarding current stressors and situation, and review of patient completed daily inventory. Clinician utilized active listening and empathetic response and validated patient emotions. Clinician facilitated processing group on pertinent issues.?    Therapist Response:  Patient arrived within time allowed. Patient rates her mood at a 4 on a scale of 1-10 with 10 being best. Pt states she feels "not great." Pt states she slept 5 broken hours and ate 2x. Pt reports continued  high anxiety regarding the state of the nation. Pt reports feeling restless and hopeless. Pt states she took 2 doses of her PRN anxiety medication. Patient able to process. Patient engagement was limited.          Session Time: 10:00 am - 11:00 am   Participation Level: Active   Behavioral Response: CasualAlertDepressed   Type of Therapy: Group Therapy   Treatment Goals addressed: Coping   Progress Towards Goals: Progressing   Interventions: CBT, DBT, Solution Focused, Strength-based, Supportive, and Reframing   Therapist Response: Cln led discussion on unknowns and the way they impact our anxiety. Cln discussed cognitive restructuring, DBT distraction skills, and positive mantras/prayer as ways to address the anxiety. Cln discussed idea "I can do hard things" and ways to bolster confidence in our ability to handle difficult situations.    Therapist Response: Pt shared current unknowns they are dealing with and is able to identify ways to manage.        Session Time: 11:00 -12:00   Participation Level: Active   Behavioral Response: CasualAlertDepressed   Type of Therapy: Group Therapy   Treatment Goals addressed: Coping   Progress Towards Goals: Progressing   Interventions: CBT, DBT, Solution Focused, Strength-based, Supportive, and Reframing   Summary: Cln continued topic of boundaries and introduced how to set and maintain healthy boundaries. Cln utilized handout "how to set boundaries" and group members worked through examples to practice setting appropriate boundaries.    Therapist Response: Pt engaged in discussion and reports struggle with maintaining boundaries.   **Pt chose to leave group at 12 due to a conflicting appt.        Suicidal/Homicidal: Nowithout intent/plan   Plan: Pt will continue in PHP while  working to decrease depression and anxiety symptoms, increase daily functioning, and increase ability to manage symptoms in a healthy manner.      Collaboration of Care: Medication Management AEB C. Cosby, MD  Patient/Guardian was advised Release of Information must be obtained prior to any record release in order to collaborate their care with an outside provider. Patient/Guardian was advised if they have not already done so to contact the registration department to sign all necessary forms in order for [place] to release information regarding their care.   Consent: Patient/Guardian gives verbal consent for treatment and assignment of benefits for services provided during this visit. Patient/Guardian expressed understanding and agreed to proceed.   Diagnosis: Severe episode of recurrent major depressive disorder, without psychotic features (HCC) [F33.2]    1. Severe episode of recurrent major depressive disorder, without psychotic features (HCC)       Donia Guiles, LCSW
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
## 2023-04-23 NOTE — Psych (Signed)
Virtual Visit via Video Note  I connected with [name] on 04/17/23 at  9:00 AM EST by a video enabled telemedicine application and verified that I am speaking with the correct person using two identifiers.  Location: Patient: Patient's home Provider: Clinical Home office   I discussed the limitations of evaluation and management by telemedicine and the availability of in person appointments. The patient expressed understanding and agreed to proceed.  I discussed the assessment and treatment plan with the patient. The patient was provided an opportunity to ask questions and all were answered. The patient agreed with the plan and demonstrated an understanding of the instructions.   The patient was advised to call back or seek an in-person evaluation if the symptoms worsen or if the condition fails to improve as anticipated.  Pt was provided 240 minutes of non-face-to-face time during this encounter.   Anna Guiles, LCSW   [facility] PHP THERAPIST PROGRESS NOTE  [name] [id number]  Session Time: 9:00 - 10:00  Participation Level: Active  Behavioral Response: CasualAlertAnxious and Depressed  Type of Therapy: Group Therapy  Treatment Goals addressed: Coping  Progress Towards Goals: Progressing  Interventions: CBT, DBT, Solution Focused, Strength-based, Supportive, and Reframing  Summary: [name] is a 52 y.o. female who presents with anxiety and depression symptoms. Clinician led check-in regarding current stressors and situation, and review of patient completed daily inventory. Clinician utilized active listening and empathetic response and validated patient emotions. Clinician facilitated processing group on pertinent issues.?    Therapist Response:  Patient arrived within time allowed. Patient rates her mood at a 7 on a scale of 1-10 with 10 being best. Pt states she feels "tired." Pt states she slept 6 broken hours and ate 2x. Pt reports she pushed  herself too much this weekend. Pt states she went to the barn Saturday and raked the yard yesterday. Pt states she continues to struggle with finding her limits. Patient able to process. Patient engagement was limited.          Session Time: 10:00 am - 11:00 am   Participation Level: Active   Behavioral Response: CasualAlertDepressed   Type of Therapy: Group Therapy   Treatment Goals addressed: Coping   Progress Towards Goals: Progressing   Interventions: CBT, DBT, Solution Focused, Strength-based, Supportive, and Reframing   Therapist Response: Cln led processing group for pt's current struggles. Group members shared stressors and provided support and feedback. Cln brought in topics of boundaries, healthy relationships, and unhealthy thought processes to inform discussion.    Therapist Response: Pt able to process and provide support to group.            Session Time: 11:00 -12:00   Participation Level: Active   Behavioral Response: CasualAlertDepressed   Type of Therapy: Group Therapy   Treatment Goals addressed: Coping   Progress Towards Goals: Progressing   Interventions: CBT, DBT, Solution Focused, Strength-based, Supportive, and Reframing   Summary: Cln introduced the "Thoughts" distraction skills from DBT distress tolerance skill ACCEPTS. Cln discussed how this set of distraction skills can be helpful when in situations where outside resources are limited or unavailable. Group practiced and brainstormed ways to apply the Thought skill.     Therapist Response:  Pt engaged in discussion and determines ways to practice the skills.             Session Time: 12:00 -1:00   Participation Level: Active   Behavioral Response: CasualAlertDepressed   Type of Therapy: Group therapy  Treatment Goals addressed: Coping   Progress Towards Goals: Progressing   Interventions: CBT, DBT, Solution Focused, Strength-based, Supportive, and Reframing   Summary: 12:00 -  12:50: Cln led discussion on personal standards and they way in which it impacts the way we view ourselves and our abilities. Group members discussed judgment, struggles, and barriers they experience in terms of personal standards. Cln brought in topics of balance, grace, and kindness. Cln proposed the "best friend test" as a way to calibrate whether we are viewing our situation with kindness or harshness.  12:50 - 1:00 Clinician assessed for immediate needs, medication compliance and efficacy, and safety concerns.   Therapist Response: 12:00 - 12:50: Pt engaged in discussion and reports willingness to utilize the best friend test.  12:50 - 1:00 pm: At check-out, patient reports no immediate concerns. Patient demonstrates progress as evidenced by continued engagement and responsiveness to treatment. Patient denies SI/HI/self-harm thoughts at the end of group.       Suicidal/Homicidal: Nowithout intent/plan   Plan: Pt will continue in PHP while working to decrease depression and anxiety symptoms, increase daily functioning, and increase ability to manage symptoms in a healthy manner.     Collaboration of Care: Medication Management AEB C. Cosby, MD  Patient/Guardian was advised Release of Information must be obtained prior to any record release in order to collaborate their care with an outside provider. Patient/Guardian was advised if they have not already done so to contact the registration department to sign all necessary forms in order for [place] to release information regarding their care.   Consent: Patient/Guardian gives verbal consent for treatment and assignment of benefits for services provided during this visit. Patient/Guardian expressed understanding and agreed to proceed.   Diagnosis: Severe episode of recurrent major depressive disorder, without psychotic features (HCC) [F33.2]    1. Severe episode of recurrent major depressive disorder, without psychotic features (HCC)        Anna Guiles, LCSW

## 2023-04-23 NOTE — Psych (Signed)
Virtual Visit via Video Note  I connected with [name] on 04/18/23 at  9:00 AM EST by a video enabled telemedicine application and verified that I am speaking with the correct person using two identifiers.  Location: Patient: Patient's home Provider: Clinical Home office   I discussed the limitations of evaluation and management by telemedicine and the availability of in person appointments. The patient expressed understanding and agreed to proceed.  I discussed the assessment and treatment plan with the patient. The patient was provided an opportunity to ask questions and all were answered. The patient agreed with the plan and demonstrated an understanding of the instructions.   The patient was advised to call back or seek an in-person evaluation if the symptoms worsen or if the condition fails to improve as anticipated.  Pt was provided 240 minutes of non-face-to-face time during this encounter.   Donia Guiles, LCSW   [facility] PHP THERAPIST PROGRESS NOTE  Laiyana Panzera [id number]  Session Time: 9:00 - 10:00  Participation Level: Active  Behavioral Response: CasualAlertAnxious and Depressed  Type of Therapy: Group Therapy  Treatment Goals addressed: Coping  Progress Towards Goals: Progressing  Interventions: CBT, DBT, Solution Focused, Strength-based, Supportive, and Reframing  Summary: [name] is a 52 y.o. female who presents with anxiety and depression symptoms. Clinician led check-in regarding current stressors and situation, and review of patient completed daily inventory. Clinician utilized active listening and empathetic response and validated patient emotions. Clinician facilitated processing group on pertinent issues.?    Therapist Response:  Patient arrived within time allowed. Patient rates her mood at a 7.5 on a scale of 1-10 with 10 being best. Pt states she feels "pretty good." Pt states she slept 7 broken hours and ate 2x. Pt reports she  rested and took a nap yesterday. Pt reports continued struggles with allowing herself to rest but is continuing to try reframing. Pt reports anxiety regarding the time period she will go back to work before her last day.  Patient able to process. Patient engagement was limited.          Session Time: 10:00 am - 11:00 am   Participation Level: Active   Behavioral Response: CasualAlertDepressed   Type of Therapy: Group Therapy   Treatment Goals addressed: Coping   Progress Towards Goals: Progressing   Interventions: CBT, DBT, Solution Focused, Strength-based, Supportive, and Reframing   Therapist Response: Cln led discussion on extending grace and kindness to ourselves. Group discussed the messages they give themselves and how it impacts them.    Therapist Response: Pt engaged in discussion and shares difficulties with being kind to themself.            Session Time: 11:00 -12:00   Participation Level: Active   Behavioral Response: CasualAlertDepressed   Type of Therapy: Group Therapy   Treatment Goals addressed: Coping   Progress Towards Goals: Progressing   Interventions: CBT, DBT, Solution Focused, Strength-based, Supportive, and Reframing   Summary: Cln introduced DBT emotion regulation skill, PLEASE. Cln discussed the importance of taking care of our whole body as a way to aid in stabilizing emotions. Group discussed ways they struggle to manage the elements of PLEASE and how to remove barriers.     Therapist Response: Pt engaged in discussion and is able to identify ways to utilize PLEASE skill.         Session Time: 12:00 -1:00   Participation Level: Active   Behavioral Response: CasualAlertDepressed   Type of Therapy: Group therapy  Treatment Goals addressed: Coping   Progress Towards Goals: Progressing   Interventions: OT group   Summary: 12:00 - 12:50: Occupational Therapy group with cln E. Hollan.  12:50 - 1:00 Clinician assessed for immediate  needs, medication compliance and efficacy, and safety concerns.   Therapist Response: 12:00 - 12:50: See note 12:50 - 1:00 pm: At check-out, patient reports no immediate concerns. Patient demonstrates progress as evidenced by continued engagement and responsiveness to treatment. Patient denies SI/HI/self-harm thoughts at the end of group.       Suicidal/Homicidal: Nowithout intent/plan   Plan: Pt will discharge from PHP due to meeting treatment goals of decreased depression and anxiety symptoms, increased daily functioning, and increased ability to manage symptoms in a healthy manner. Pt has declined IOP and will resume outpatient therapy/psychiatry. Pt and provider are aligned with discharge plan. Pt denies SI/HI at time of discharge.     Collaboration of Care: Medication Management AEB C. Cosby, MD  Patient/Guardian was advised Release of Information must be obtained prior to any record release in order to collaborate their care with an outside provider. Patient/Guardian was advised if they have not already done so to contact the registration department to sign all necessary forms in order for [name] to release information regarding their care.   Consent: Patient/Guardian gives verbal consent for treatment and assignment of benefits for services provided during this visit. Patient/Guardian expressed understanding and agreed to proceed.   Diagnosis: Severe episode of recurrent major depressive disorder, without psychotic features (HCC) [F33.2]    1. Severe episode of recurrent major depressive disorder, without psychotic features (HCC)   2. Anxiety   3. Difficulty coping       Donia Guiles, LCSW

## 2023-04-23 NOTE — Psych (Signed)
Virtual Visit via Video Note  I connected with [name] on 04/14/23 at  9:00 AM EST by a video enabled telemedicine application and verified that I am speaking with the correct person using two identifiers.  Location: Patient: Patient's home Provider: Clinical Home office   I discussed the limitations of evaluation and management by telemedicine and the availability of in person appointments. The patient expressed understanding and agreed to proceed.  I discussed the assessment and treatment plan with the patient. The patient was provided an opportunity to ask questions and all were answered. The patient agreed with the plan and demonstrated an understanding of the instructions.   The patient was advised to call back or seek an in-person evaluation if the symptoms worsen or if the condition fails to improve as anticipated.  Pt was provided 240 minutes of non-face-to-face time during this encounter.   Donia Guiles, LCSW   [facility] PHP THERAPIST PROGRESS NOTE  [name] [id number]  Session Time: 9:00 - 10:00  Participation Level: Active  Behavioral Response: CasualAlertAnxious and Depressed  Type of Therapy: Group Therapy  Treatment Goals addressed: Coping  Progress Towards Goals: Progressing  Interventions: CBT, DBT, Solution Focused, Strength-based, Supportive, and Reframing  Summary: [name] is a 52 y.o. female who presents with anxiety and depression symptoms. Clinician led check-in regarding current stressors and situation, and review of patient completed daily inventory. Clinician utilized active listening and empathetic response and validated patient emotions. Clinician facilitated processing group on pertinent issues.?    Therapist Response:  Patient arrived within time allowed. Patient rates her mood at a 7 on a scale of 1-10 with 10 being best. Pt states she feels "better than yesterday." Pt states she slept 6 broken hours and ate 2x. Pt reports  she was cranky and "pretty mean" yesterday. It is unclear whether pt is being too hard on herself with this judgment. Pt reports working more on the damage to her home. Pt reports the negotiations with her new job offer are complete and she is accepting. Pt reports high anxiety re: giving her notice at current job. Pt reports not being able to celebrate her new situation due to these anxieties.  Patient able to process. Patient engagement was limited.          Session Time: 10:00 am - 11:00 am   Participation Level: Active   Behavioral Response: CasualAlertDepressed   Type of Therapy: Group Therapy   Treatment Goals addressed: Coping   Progress Towards Goals: Progressing   Interventions: CBT, DBT, Solution Focused, Strength-based, Supportive, and Reframing   Therapist Response: Cln continued topic of DBT distress tolerance skills and the ACCEPTS distraction skill. Group reviewed E-P-S skills and discussed how they can practice them in their every day life.    Therapist Response: Pt engaged in discussion and is able to state ways to apply these skills.            Session Time: 11:00 -12:00   Participation Level: Active   Behavioral Response: CasualAlertDepressed   Type of Therapy: Group Therapy   Treatment Goals addressed: Coping   Progress Towards Goals: Progressing   Interventions: CBT, DBT, Solution Focused, Strength-based, Supportive, and Reframing   Summary: Cln led discussion on ways to manage stressors and feelings over the weekend. Group members  brainstormed things to do over the weekend for multiple levels of energy, access, and moods. Cln reviewed crisis services should they be needed and provided pt's with the text crisis line, mobile crisis, national  suicide hotline, [facility] 24/7 line, and information on [facility].      Therapist Response:  Pt engaged in discussion and is able to identify 3 ideas of what to do over the weekend to keep their mind engaged.          Session Time: 12:00 -1:00   Participation Level: Active   Behavioral Response: CasualAlertDepressed   Type of Therapy: Group therapy   Treatment Goals addressed: Coping   Progress Towards Goals: Progressing   Interventions: OT group   Summary: 12:00 - 12:50: Occupational Therapy group with cln E. Hollan.  12:50 - 1:00 Clinician assessed for immediate needs, medication compliance and efficacy, and safety concerns.   Therapist Response: 12:00 - 12:50: See note 12:50 - 1:00 pm: At check-out, patient reports no immediate concerns. Patient demonstrates progress as evidenced by continued engagement and responsiveness to treatment. Patient denies SI/HI/self-harm thoughts at the end of group.       Suicidal/Homicidal: Nowithout intent/plan   Plan: Pt will continue in PHP while working to decrease depression and anxiety symptoms, increase daily functioning, and increase ability to manage symptoms in a healthy manner.     Collaboration of Care: Medication Management AEB C. Cosby, MD  Patient/Guardian was advised Release of Information must be obtained prior to any record release in order to collaborate their care with an outside provider. Patient/Guardian was advised if they have not already done so to contact the registration department to sign all necessary forms in order for [place] to release information regarding their care.   Consent: Patient/Guardian gives verbal consent for treatment and assignment of benefits for services provided during this visit. Patient/Guardian expressed understanding and agreed to proceed.   Diagnosis: Severe episode of recurrent major depressive disorder, without psychotic features (HCC) [F33.2]    1. Severe episode of recurrent major depressive disorder, without psychotic features (HCC)       Donia Guiles, LCSW

## 2023-04-24 ENCOUNTER — Other Ambulatory Visit (HOSPITAL_COMMUNITY): Payer: BC Managed Care – PPO

## 2023-04-24 NOTE — Psych (Signed)
Pt on schedule in error, discharged PHP yesterday.

## 2023-04-25 ENCOUNTER — Other Ambulatory Visit (HOSPITAL_COMMUNITY): Payer: BC Managed Care – PPO

## 2023-04-26 ENCOUNTER — Other Ambulatory Visit (HOSPITAL_COMMUNITY): Payer: BC Managed Care – PPO

## 2023-04-27 ENCOUNTER — Other Ambulatory Visit (HOSPITAL_COMMUNITY): Payer: BC Managed Care – PPO

## 2023-04-28 ENCOUNTER — Other Ambulatory Visit (HOSPITAL_COMMUNITY): Payer: BC Managed Care – PPO

## 2023-05-01 ENCOUNTER — Other Ambulatory Visit (HOSPITAL_COMMUNITY): Payer: BC Managed Care – PPO

## 2023-05-02 ENCOUNTER — Other Ambulatory Visit (HOSPITAL_COMMUNITY): Payer: BC Managed Care – PPO

## 2023-05-03 ENCOUNTER — Other Ambulatory Visit (HOSPITAL_COMMUNITY): Payer: BC Managed Care – PPO

## 2023-05-22 ENCOUNTER — Other Ambulatory Visit: Payer: Self-pay | Admitting: Nurse Practitioner

## 2023-05-23 NOTE — Telephone Encounter (Signed)
Requested Prescriptions  Pending Prescriptions Disp Refills   lisinopril (ZESTRIL) 5 MG tablet [Pharmacy Med Name: LISINOPRIL 5 MG TABLET] 90 tablet 0    Sig: TAKE 1 TABLET (5 MG TOTAL) BY MOUTH DAILY.     Cardiovascular:  ACE Inhibitors Failed - 05/23/2023 11:36 AM      Failed - Last BP in normal range    BP Readings from Last 1 Encounters:  02/16/23 (!) 143/91         Passed - Cr in normal range and within 180 days    Creat  Date Value Ref Range Status  11/11/2022 0.64 0.50 - 1.03 mg/dL Final   Creatinine, Ser  Date Value Ref Range Status  01/21/2023 0.70 0.44 - 1.00 mg/dL Final         Passed - K in normal range and within 180 days    Potassium  Date Value Ref Range Status  01/21/2023 3.6 3.5 - 5.1 mmol/L Final         Passed - Patient is not pregnant      Passed - Valid encounter within last 6 months    Recent Outpatient Visits           3 months ago Hypertension, unspecified type   [facility] Berniece Salines, FNP   6 months ago Hypertension, unspecified type   [facility] Berniece Salines, FNP   8 months ago Anxiety   [facility] Della Goo F, FNP   10 months ago Severe episode of recurrent major depressive disorder, without psychotic features [facility])   [facility] Berniece Salines, FNP   11 months ago Subacute cough   [facility] Margarita Mail, [place]

## 2023-05-26 ENCOUNTER — Institutional Professional Consult (permissible substitution): Payer: BC Managed Care – PPO | Admitting: Plastic Surgery

## 2023-06-29 DIAGNOSIS — M1711 Unilateral primary osteoarthritis, right knee: Secondary | ICD-10-CM | POA: Diagnosis not present

## 2023-07-08 ENCOUNTER — Other Ambulatory Visit: Payer: Self-pay | Admitting: Nurse Practitioner

## 2023-07-10 NOTE — Telephone Encounter (Signed)
 Requested Prescriptions  Refused Prescriptions Disp Refills   lisinopril  (ZESTRIL ) 5 MG tablet [Pharmacy Med Name: LISINOPRIL  5 MG TABLET] 90 tablet 0    Sig: TAKE 1 TABLET (5 MG TOTAL) BY MOUTH DAILY.     Cardiovascular:  ACE Inhibitors Failed - 07/10/2023  2:02 PM      Failed - Last BP in normal range    BP Readings from Last 1 Encounters:  02/16/23 (!) 143/91         Passed - Cr in normal range and within 180 days    Creat  Date Value Ref Range Status  11/11/2022 0.64 0.50 - 1.03 mg/dL Final   Creatinine, Ser  Date Value Ref Range Status  01/21/2023 0.70 0.44 - 1.00 mg/dL Final         Passed - K in normal range and within 180 days    Potassium  Date Value Ref Range Status  01/21/2023 3.6 3.5 - 5.1 mmol/L Final         Passed - Patient is not pregnant      Passed - Valid encounter within last 6 months    Recent Outpatient Visits           4 months ago Hypertension, unspecified type   [facility] [name], FNP   8 months ago Hypertension, unspecified type   [facility] [name], FNP   10 months ago Anxiety   [facility] Donny Gall F, FNP   11 months ago Severe episode of recurrent major depressive disorder, without psychotic features [facility])   [facility] [name], FNP   1 year ago Subacute cough   [facility] Rockney Cid, [place]

## 2023-07-14 ENCOUNTER — Telehealth (HOSPITAL_COMMUNITY): Payer: Self-pay | Admitting: Professional

## 2023-07-21 ENCOUNTER — Telehealth (HOSPITAL_COMMUNITY): Payer: Self-pay | Admitting: Professional

## 2023-07-21 DIAGNOSIS — M79672 Pain in left foot: Secondary | ICD-10-CM | POA: Diagnosis not present

## 2023-08-04 ENCOUNTER — Encounter: Payer: Self-pay | Admitting: Internal Medicine

## 2023-08-04 ENCOUNTER — Telehealth: Payer: Self-pay | Admitting: Internal Medicine

## 2023-08-04 NOTE — Telephone Encounter (Signed)
 Pt called and wants to schedule iron appt. I told her I would message the MD and that she will most likely have to do lab/MD/infusion. Please advise on scheduling. The phone number in chart is correct.

## 2023-08-07 ENCOUNTER — Inpatient Hospital Stay (HOSPITAL_BASED_OUTPATIENT_CLINIC_OR_DEPARTMENT_OTHER): Admitting: Nurse Practitioner

## 2023-08-07 ENCOUNTER — Inpatient Hospital Stay: Attending: Internal Medicine

## 2023-08-07 ENCOUNTER — Inpatient Hospital Stay

## 2023-08-07 ENCOUNTER — Encounter: Payer: Self-pay | Admitting: Nurse Practitioner

## 2023-08-07 VITALS — BP 153/96 | HR 69 | Temp 97.2°F | Resp 16 | Ht 65.5 in | Wt 171.0 lb

## 2023-08-07 VITALS — BP 143/92 | HR 58 | Temp 97.9°F | Resp 18

## 2023-08-07 DIAGNOSIS — Z9884 Bariatric surgery status: Secondary | ICD-10-CM | POA: Insufficient documentation

## 2023-08-07 DIAGNOSIS — D508 Other iron deficiency anemias: Secondary | ICD-10-CM | POA: Diagnosis not present

## 2023-08-07 DIAGNOSIS — E611 Iron deficiency: Secondary | ICD-10-CM | POA: Diagnosis not present

## 2023-08-07 DIAGNOSIS — D649 Anemia, unspecified: Secondary | ICD-10-CM

## 2023-08-07 LAB — IRON AND TIBC
Iron: 93 ug/dL (ref 28–170)
Saturation Ratios: 23 % (ref 10.4–31.8)
TIBC: 413 ug/dL (ref 250–450)
UIBC: 320 ug/dL

## 2023-08-07 LAB — CBC WITH DIFFERENTIAL/PLATELET
Abs Immature Granulocytes: 0.01 10*3/uL (ref 0.00–0.07)
Basophils Absolute: 0 10*3/uL (ref 0.0–0.1)
Basophils Relative: 0 %
Eosinophils Absolute: 0.2 10*3/uL (ref 0.0–0.5)
Eosinophils Relative: 3 %
HCT: 42.4 % (ref 36.0–46.0)
Hemoglobin: 14.1 g/dL (ref 12.0–15.0)
Immature Granulocytes: 0 %
Lymphocytes Relative: 22 %
Lymphs Abs: 1.2 10*3/uL (ref 0.7–4.0)
MCH: 29.4 pg (ref 26.0–34.0)
MCHC: 33.3 g/dL (ref 30.0–36.0)
MCV: 88.5 fL (ref 80.0–100.0)
Monocytes Absolute: 0.2 10*3/uL (ref 0.1–1.0)
Monocytes Relative: 4 %
Neutro Abs: 3.9 10*3/uL (ref 1.7–7.7)
Neutrophils Relative %: 71 %
Platelets: 283 10*3/uL (ref 150–400)
RBC: 4.79 MIL/uL (ref 3.87–5.11)
RDW: 12.6 % (ref 11.5–15.5)
WBC: 5.6 10*3/uL (ref 4.0–10.5)
nRBC: 0 % (ref 0.0–0.2)

## 2023-08-07 LAB — FERRITIN: Ferritin: 56 ng/mL (ref 11–307)

## 2023-08-07 LAB — LACTATE DEHYDROGENASE: LDH: 150 U/L (ref 98–192)

## 2023-08-07 LAB — VITAMIN D 25 HYDROXY (VIT D DEFICIENCY, FRACTURES): Vit D, 25-Hydroxy: 25.21 ng/mL — ABNORMAL LOW (ref 30–100)

## 2023-08-07 LAB — VITAMIN B12: Vitamin B-12: 596 pg/mL (ref 180–914)

## 2023-08-07 MED ORDER — IRON SUCROSE 20 MG/ML IV SOLN
200.0000 mg | Freq: Once | INTRAVENOUS | Status: AC
Start: 2023-08-07 — End: 2023-08-07
  Administered 2023-08-07: 200 mg via INTRAVENOUS

## 2023-08-07 MED ORDER — SODIUM CHLORIDE 0.9% FLUSH
10.0000 mL | Freq: Once | INTRAVENOUS | Status: AC | PRN
  Administered 2023-08-07: 10 mL
  Filled 2023-08-07: qty 10

## 2023-08-07 NOTE — Patient Instructions (Signed)
 You can start sublingual vitamin with iron such as Bari-Melt + Iron. These are available to purchase online. It was a pleasure meeting you today and thank you for allowing me to participate in your care. -Consuello Masse, NP

## 2023-08-07 NOTE — Progress Notes (Signed)
 [facility] CONSULT NOTE  Patient Care Team: Berniece Salines, FNP as PCP - General (Nurse Practitioner) Earna Coder, MD as Consulting Physician (Oncology)  CHIEF COMPLAINTS/PURPOSE OF CONSULTATION: ANEMIA  HEMATOLOGY HISTORY  # ANEMIA [Hb; MCV-platelets- WBC; Iron sat; ferritin;  GFR- CT/US- ;  EGD/colonoscopy- FEB 2024 [Dr.Anna]- unremarkable.   Latest Reference Range & Units 09/28/22 14:17  Iron 27 - 159 ug/dL 33  UIBC 161 - 096 ug/dL 045 (H)  TIBC 409 - 811 ug/dL 914 (H)  Ferritin 15 - 150 ng/mL 8 (L)  Iron Saturation 15 - 55 % 7 (LL)  (LL): Data is critically low (H): Data is abnormally high (L): Data is abnormally low  HISTORY OF PRESENTING ILLNESS: Patient ambulating-independently.  Alone.   [name] 53 y.o.  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 She reports ongoing fatigue that doesn't improve with rest. Didn't notice improvement with iv iron infusions. Doesn't improve with exercise. Reports poor sleep initiation. Denies any neurologic complaints. Denies recent fevers or illnesses. Denies any easy bleeding or bruising. No melena or hematochezia. No pica or restless leg. Reports good appetite and denies weight loss. Denies chest pain. Denies any nausea, vomiting, constipation, or diarrhea. Denies urinary complaints. Denies vaginal bleeding, s/p hysterectomy. She feels that her depression and anxiety symptoms are currently well controlled. Patient offers no further specific complaints today.    Review of Systems  Constitutional:  Positive for malaise/fatigue. Negative for chills, diaphoresis, fever and weight loss.  HENT:  Negative for nosebleeds and sore throat.   Respiratory:  Negative for cough, hemoptysis, sputum production, shortness of breath and wheezing.   Cardiovascular:  Negative for chest pain, palpitations, orthopnea and leg swelling.  Gastrointestinal:  Negative  for abdominal pain, blood in stool, constipation, diarrhea, heartburn, melena, nausea and vomiting.  Genitourinary:  Negative for dysuria, frequency, hematuria and urgency.  Musculoskeletal:  Positive for joint pain. Negative for back pain, falls, myalgias and neck pain.  Skin:  Negative for itching and rash.  Neurological:  Negative for dizziness, tingling, focal weakness, weakness and headaches.  Endo/Heme/Allergies:  Does not bruise/bleed easily.  Psychiatric/Behavioral:  Negative for depression. The patient has insomnia. The patient is not nervous/anxious.     MEDICAL HISTORY:  Past Medical History:  Diagnosis Date   Anxiety    Bowel obstruction (HCC)    Depression    Hypertension     SURGICAL HISTORY: Past Surgical History:  Procedure Laterality Date   ABDOMINAL HYSTERECTOMY     CHOLECYSTECTOMY     COLONOSCOPY WITH PROPOFOL N/A 07/07/2022   Procedure: COLONOSCOPY WITH PROPOFOL;  Surgeon: [name], MD;  Location: [facility] ENDOSCOPY;  Service: Gastroenterology;  Laterality: N/A;   ESOPHAGOGASTRODUODENOSCOPY N/A 07/07/2022   Procedure: ESOPHAGOGASTRODUODENOSCOPY (EGD);  Surgeon: [name], MD;  Location: [facility] ENDOSCOPY;  Service: Gastroenterology;  Laterality: N/A;   GASTRIC BYPASS      SOCIAL HISTORY: Social History   Socioeconomic History   Marital status: Married    Spouse name: Gilda Crease   Number of children: 3   Years of education: Not on file   Highest education level: Some college, no degree  Occupational History   Not on file  Tobacco Use   Smoking status: Never   Smokeless tobacco: Never  Vaping Use   Vaping status: Never Used  Substance and Sexual Activity   Alcohol use: Yes    Comment: occassional/social   Drug use: Yes    Types:  Marijuana   Sexual activity: Not on file  Other Topics Concern   Not on file  Social History Narrative   Moved from [place] to [place] then went to [place] and back in [place] for 7 years   Social Drivers of [employer]: Low Risk  (05/25/2022)   Overall Financial Resource Strain (CARDIA)    Difficulty of Paying Living Expenses: Not hard at all  Food Insecurity: No Food Insecurity (10/10/2022)   Hunger Vital Sign    Worried About Running Out of Food in the Last Year: Never true    Ran Out of Food in the Last Year: Never true  Transportation Needs: Unknown (10/10/2022)   PRAPARE - Administrator, Civil Service (Medical): No    Lack of Transportation (Non-Medical): Not on file  Physical Activity: Sufficiently Active (05/25/2022)   Exercise Vital Sign    Days of Exercise per Week: 4 days    Minutes of Exercise per Session: 60 min  Stress: Stress Concern Present (05/25/2022)   [employer] of Occupational Health - Occupational Stress Questionnaire    Feeling of Stress : To some extent  Social Connections: Moderately Integrated (05/25/2022)   Social Connection and Isolation Panel [NHANES]    Frequency of Communication with Friends and Family: Twice a week    Frequency of Social Gatherings with Friends and Family: Once a week    Attends Religious Services: 1 to 4 times per year    Active Member of [employer] or Organizations: No    Attends Banker Meetings: Never    Marital Status: Married  Catering manager Violence: Not At Risk (10/10/2022)   Humiliation, Afraid, Rape, and Kick questionnaire    Fear of Current or Ex-Partner: No    Emotionally Abused: No    Physically Abused: No    Sexually Abused: No    FAMILY HISTORY: Family History  Problem Relation Age of Onset   Breast cancer Mother 71   Cancer Mother    Heart disease Father    Alcohol abuse Father    Diabetes Paternal Grandmother    Heart disease Brother    Anxiety disorder Brother     ALLERGIES:  has no known allergies.  MEDICATIONS:  Current Outpatient Medications  Medication Sig Dispense Refill   lisinopril (ZESTRIL) 5 MG tablet TAKE 1 TABLET (5 MG TOTAL) BY MOUTH DAILY. 90  tablet 0   omeprazole (PRILOSEC) 40 MG capsule Take 1 capsule (40 mg total) by mouth daily. 90 capsule 3   tiZANidine (ZANAFLEX) 4 MG tablet Take 4 mg by mouth every 6 (six) hours as needed.     traMADol (ULTRAM) 50 MG tablet Take by mouth.     cyanocobalamin (VITAMIN B12) 500 MCG tablet Take 500 mcg by mouth daily. (Patient not taking: Reported on 08/07/2023)     ergocalciferol (VITAMIN D2) 1.25 MG (50000 UT) capsule Take 1 capsule (50,000 Units total) by mouth once a week. (Patient not taking: Reported on 08/07/2023) 4 capsule 3   traZODone (DESYREL) 50 MG tablet TAKE 1/2 TO 1 TABLET(25 TO 50 MG) BY MOUTH AT BEDTIME AS NEEDED FOR SLEEP (Patient not taking: Reported on 08/07/2023) 30 tablet 2   No current facility-administered medications for this visit.     PHYSICAL EXAMINATION:   Vitals:   08/07/23 1319  BP: (!) 153/96  Pulse: 69  Resp: 16  Temp: (!) 97.2 F (36.2 C)  SpO2: 100%   Filed Weights  08/07/23 1319  Weight: 171 lb (77.6 kg)    Physical Exam Vitals and nursing note reviewed.  HENT:     Head: Normocephalic and atraumatic.     Mouth/Throat:     Pharynx: Oropharynx is clear.  Eyes:     Extraocular Movements: Extraocular movements intact.     Pupils: Pupils are equal, round, and reactive to light.  Cardiovascular:     Rate and Rhythm: Normal rate and regular rhythm.  Pulmonary:     Comments: Decreased breath sounds bilaterally.  Abdominal:     Palpations: Abdomen is soft.  Musculoskeletal:        General: Normal range of motion.     Cervical back: Normal range of motion.  Skin:    General: Skin is warm.  Neurological:     General: No focal deficit present.     Mental Status: She is alert and oriented to person, place, and time.  Psychiatric:        Behavior: Behavior normal.        Judgment: Judgment normal.      LABORATORY DATA:  I have reviewed the data as listed Lab Results  Component Value Date   WBC 5.6 08/07/2023   HGB 14.1 08/07/2023   HCT  42.4 08/07/2023   MCV 88.5 08/07/2023   PLT 283 08/07/2023   Recent Labs    12/18/22 1211 12/18/22 1258 12/24/22 1609 01/09/23 1559 01/21/23 1111  NA 139   < > 140 139 141  K 3.0*   < > 3.6 3.6 3.6  CL 108   < > 105 106 103  CO2 20*  --  27 22 26   GLUCOSE 118*   < > 92 122* 101*  BUN 24*   < > 12 21* 17  CREATININE 0.75   < > 0.61 0.74 0.70  CALCIUM 8.7*  --  8.7* 8.9 9.2  GFRNONAA >60  --  >60 >60 >60  PROT 6.8  --  7.2  --  7.9  ALBUMIN 4.0  --  4.1  --  4.8  AST 83*  --  24  --  29  ALT 51*  --  50*  --  31  ALKPHOS 94  --  117  --  120  BILITOT 1.1  --  0.8  --  0.7   < > = values in this interval not displayed.   Iron/TIBC/Ferritin/ %Sat    Component Value Date/Time   IRON 93 08/07/2023 1306   IRON 33 09/28/2022 1417   TIBC 413 08/07/2023 1306   TIBC 472 (H) 09/28/2022 1417   FERRITIN 56 08/07/2023 1306   FERRITIN 8 (L) 09/28/2022 1417   IRONPCTSAT 23 08/07/2023 1306   IRONPCTSAT 81 (H) 11/11/2022 1505   No results found.  ASSESSMENT & PLAN:   Iron Deficiency- April 2024- pcp hmg 12.8, ferritin 8, iron 7%. Symptomatic.  No improvement with oral iron previously. Elected for IV iron. EGD colonoscopy Feb 2024- Dr Tobi Bastos. No capsule. CT AP April 2024- no kidney/bladder. Hysterectomy. 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 Last received venofer x 31 January 2023. Hemoglobin 14.1, ferritin and iron studies pending at time of visit. D/t symptoms, proceed with venofer today. Ferritin 56, iron sat 23%. No additional IV iron indicated. Recommend starting sublingual iron/BariMelt+Iron as this may decrease frequency of infusions.  Fatigue- iron stores are well replenished. B12 and Vitamin D are pending. At risk of malabsorption and other etiologies. Reports poor sleep. Recommend she follow  up with PCP for additional workup and management.  Family History cancer: mother- 28s- breast cancer; stomach cancer [died in 1989]; father- ? Cancer. 2 brother- no  cancer; no maternal relatives. Personal hx of BRCA genetics- negative. Discuss genetic evaluation.  Hypertension- has run out of lisinopril. Recommended she follow up with PCP for refill.    Disposition:  Venofer today 6 mo- lab (cbc, ferritin, iron studies, b12)  Day to week later see Dr Donneta Romberg, +/- venofer- la  No problem-specific Assessment & Plan notes found for this encounter.  All questions were answered. The patient knows to call the clinic with any problems, questions or concerns.  Alinda Dooms, NP 08/07/2023   CC: Della Goo, FNP

## 2023-08-09 ENCOUNTER — Ambulatory Visit: Admitting: Nurse Practitioner

## 2023-08-09 ENCOUNTER — Encounter: Payer: Self-pay | Admitting: Nurse Practitioner

## 2023-08-09 VITALS — BP 134/86 | HR 69 | Temp 98.0°F | Resp 16 | Ht 65.5 in | Wt 171.9 lb

## 2023-08-09 DIAGNOSIS — R5383 Other fatigue: Secondary | ICD-10-CM | POA: Diagnosis not present

## 2023-08-09 DIAGNOSIS — F419 Anxiety disorder, unspecified: Secondary | ICD-10-CM

## 2023-08-09 DIAGNOSIS — F331 Major depressive disorder, recurrent, moderate: Secondary | ICD-10-CM

## 2023-08-09 DIAGNOSIS — F5105 Insomnia due to other mental disorder: Secondary | ICD-10-CM

## 2023-08-09 DIAGNOSIS — R0683 Snoring: Secondary | ICD-10-CM

## 2023-08-09 DIAGNOSIS — K219 Gastro-esophageal reflux disease without esophagitis: Secondary | ICD-10-CM

## 2023-08-09 DIAGNOSIS — F99 Mental disorder, not otherwise specified: Secondary | ICD-10-CM

## 2023-08-09 DIAGNOSIS — I10 Essential (primary) hypertension: Secondary | ICD-10-CM

## 2023-08-09 MED ORDER — HYDROXYZINE PAMOATE 25 MG PO CAPS: 25.0000 mg | ORAL_CAPSULE | Freq: Three times a day (TID) | ORAL | 1 refills | Status: DC | PRN

## 2023-08-09 MED ORDER — LISINOPRIL 5 MG PO TABS: 5.0000 mg | ORAL_TABLET | Freq: Every day | ORAL | 0 refills | Status: DC

## 2023-08-09 MED ORDER — TIZANIDINE HCL 4 MG PO TABS: 4.0000 mg | ORAL_TABLET | Freq: Four times a day (QID) | ORAL | 1 refills | Status: DC | PRN

## 2023-08-09 NOTE — Progress Notes (Signed)
 BP 134/86   Pulse 69   Temp 98 F (36.7 C)   Resp 16   Ht 5' 5.5" (1.664 m)   Wt 171 lb 14.4 oz (78 kg)   SpO2 100%   BMI 28.17 kg/m    Subjective:    Patient ID: [name], female    DOB: [birth year], 53 y.o.   MRN: [id number]  HPI: [name] is a 53 y.o. female  Chief Complaint  Patient presents with   Medical Management of Chronic Issues   Medication Refill    Discussed the use of AI scribe software for clinical note transcription with the patient, who gave verbal consent to proceed.  History of Present Illness   The patient presents with persistent fatigue and routine follow up.   She experiences persistent fatigue, describing it as 'tired all the time,' with some days being particularly severe. She considered going to the ER due to extreme tiredness last Saturday. Despite a recent iron infusion on August 07, 2023, her labs showed normal iron and ferritin levels, with low vitamin D and normal B12. She recalls a past incident in July when her potassium was low after a fall from a horse, requiring hospitalization and potassium supplementation. She wonders if her current fatigue could be related to potassium or thyroid issues, as her thyroid was checked last year and was normal.  She has a history of iron deficiency anemia, but recent labs show normal iron and ferritin levels. She recently received an iron infusion, which was administered on August 07, 2023.  She was under psychiatric care for depression and anxiety, taking hydroxyzine for anxiety, particularly before visiting the barn due to nervousness around horses after a previous fall. She mentions needing a refill for hydroxyzine soon. She is no longer in the IOP program and is unsure of her current hydroxyzine dosage. She takes tizanidine at bedtime for insomnia, which helps her sleep, although she still struggles to fall asleep at night. Her husband notes occasional snoring, but her Garmin indicates good sleep  quality. She has not completed a sleep study for potential sleep apnea due to travel and logistical issues.  She experiences pain in her knee and foot, particularly after activity. She is taking tramadol for pain management. She describes the pain as more severe after resting post-activity, such as driving home from the barn or after hiking. She is seeing an orthopedic specialist for her knee and has been advised on exercises and footwear to manage her symptoms.  She is currently taking lisinopril 5 mg daily for hypertension.  Her acid reflux is well-controlled with omeprazole 40 mg daily.       08/09/2023   10:18 AM 03/22/2023   10:56 AM 02/15/2023    7:51 AM  Depression screen PHQ 2/9  Decreased Interest 0 2 0  Down, Depressed, Hopeless 0 1 0  PHQ - 2 Score 0 3 0  Altered sleeping 3 2 0  Tired, decreased energy 3 3 1   Change in appetite 0 1 0  Feeling bad or failure about yourself  0 1 0  Trouble concentrating 1 2 1   Moving slowly or fidgety/restless 0 1 0  Suicidal thoughts 0 0 0  PHQ-9 Score 7 13 2   Difficult doing work/chores Not difficult at all Very difficult Not difficult at all    Relevant past medical, surgical, family and social history reviewed and updated as indicated. Interim medical history since our last visit reviewed. Allergies and medications reviewed and updated.  Review of Systems  Constitutional: Negative for fever or weight change.  Respiratory: Negative for cough and shortness of breath.   Cardiovascular: Negative for chest pain or palpitations.  Gastrointestinal: Negative for abdominal pain, no bowel changes.  Musculoskeletal: Negative for gait problem or joint swelling.  Skin: Negative for rash.  Neurological: Negative for dizziness or headache.  No other specific complaints in a complete review of systems (except as listed in HPI above).      Objective:    BP 134/86   Pulse 69   Temp 98 F (36.7 C)   Resp 16   Ht 5' 5.5" (1.664 m)   Wt 171 lb  14.4 oz (78 kg)   SpO2 100%   BMI 28.17 kg/m    Wt Readings from Last 3 Encounters:  08/09/23 171 lb 14.4 oz (78 kg)  08/07/23 171 lb (77.6 kg)  02/15/23 167 lb 6.4 oz (75.9 kg)    Physical Exam Vitals reviewed.  Constitutional:      Appearance: Normal appearance.  HENT:     Head: Normocephalic.  Cardiovascular:     Rate and Rhythm: Normal rate and regular rhythm.  Pulmonary:     Effort: Pulmonary effort is normal.     Breath sounds: Normal breath sounds.  Musculoskeletal:        General: Normal range of motion.  Skin:    General: Skin is warm and dry.  Neurological:     General: No focal deficit present.     Mental Status: She is alert and oriented to person, place, and time. Mental status is at baseline.  Psychiatric:        Mood and Affect: Mood normal.        Behavior: Behavior normal.        Thought Content: Thought content normal.        Judgment: Judgment normal.     Results for orders placed or performed in visit on 08/07/23  VITAMIN D 25 Hydroxy (Vit-D Deficiency, Fractures)   Collection Time: 08/07/23  1:06 PM  Result Value Ref Range   Vit D, 25-Hydroxy 25.21 (L) 30 - 100 ng/mL  Vitamin B12   Collection Time: 08/07/23  1:06 PM  Result Value Ref Range   Vitamin B-12 596 180 - 914 pg/mL  Lactate dehydrogenase   Collection Time: 08/07/23  1:06 PM  Result Value Ref Range   LDH 150 98 - 192 U/L  Iron and TIBC   Collection Time: 08/07/23  1:06 PM  Result Value Ref Range   Iron 93 28 - 170 ug/dL   TIBC 161 096 - 045 ug/dL   Saturation Ratios 23 10.4 - 31.8 %   UIBC 320 ug/dL  Ferritin   Collection Time: 08/07/23  1:06 PM  Result Value Ref Range   Ferritin 56 11 - 307 ng/mL  CBC with Differential/Platelet   Collection Time: 08/07/23  1:06 PM  Result Value Ref Range   WBC 5.6 4.0 - 10.5 K/uL   RBC 4.79 3.87 - 5.11 MIL/uL   Hemoglobin 14.1 12.0 - 15.0 g/dL   HCT 40.9 81.1 - 91.4 %   MCV 88.5 80.0 - 100.0 fL   MCH 29.4 26.0 - 34.0 pg   MCHC 33.3  30.0 - 36.0 g/dL   RDW 78.2 95.6 - 21.3 %   Platelets 283 150 - 400 K/uL   nRBC 0.0 0.0 - 0.2 %   Neutrophils Relative % 71 %   Neutro Abs 3.9 1.7 - 7.7 K/uL  Lymphocytes Relative 22 %   Lymphs Abs 1.2 0.7 - 4.0 K/uL   Monocytes Relative 4 %   Monocytes Absolute 0.2 0.1 - 1.0 K/uL   Eosinophils Relative 3 %   Eosinophils Absolute 0.2 0.0 - 0.5 K/uL   Basophils Relative 0 %   Basophils Absolute 0.0 0.0 - 0.1 K/uL   Immature Granulocytes 0 %   Abs Immature Granulocytes 0.01 0.00 - 0.07 K/uL       Assessment & Plan:   Problem List Items Addressed This Visit       Cardiovascular and Mediastinum   Hypertension   Relevant Medications   lisinopril (ZESTRIL) 5 MG tablet     Digestive   Gastroesophageal reflux disease without esophagitis     Other   Moderate episode of recurrent major depressive disorder (HCC)   Relevant Medications   hydrOXYzine (VISTARIL) 25 MG capsule   Insomnia due to other mental disorder   Relevant Medications   tiZANidine (ZANAFLEX) 4 MG tablet   Anxiety   Relevant Medications   hydrOXYzine (VISTARIL) 25 MG capsule   tiZANidine (ZANAFLEX) 4 MG tablet   Other fatigue - Primary   Relevant Orders   COMPLETE METABOLIC PANEL WITH GFR   TSH   Ambulatory referral to Pulmonology   Other Visit Diagnoses       Snoring       Relevant Orders   Ambulatory referral to Pulmonology        Assessment and Plan    Fatigue Persistent fatigue despite normal iron and ferritin levels. Vitamin D was low, and B12 was normal. Previous low potassium after a fall. Thyroid function was normal last year but will be re-evaluated. Differential diagnosis includes thyroid dysfunction, electrolyte imbalance, or sleep apnea. She has not completed a previously recommended sleep study. Discussed potential for sleep apnea and the Inspire procedure as an alternative to CPAP, which requires prior CPAP failure and is covered by insurance. - Order thyroid function tests - Order  potassium level - Refer for sleep study  Insomnia Insomnia managed with tizanidine at bedtime. She reports difficulty falling asleep despite feeling tired. No issues with current medication reported. - Continue tizanidine mg at bedtime  Depression and Anxiety previously under psychiatric care and sees a counselor for depression. Anxiety managed with hydroxyzine, taken before anxiety-inducing situations. Reports improvement in anxiety symptoms. Hydroxyzine dosage needs confirmation and refill. - Refill hydroxyzine, likely 25 mg, confirm dosage with her  Hypertension Hypertension managed with lisinopril 5 mg daily. No issues reported with current management. - Continue lisinopril 5 mg daily  Gastroesophageal Reflux Disease (GERD) GERD well controlled with omeprazole 40 mg daily. No current issues reported. - Continue omeprazole 40 mg daily  Heel Spur and Knee Pain Managed with tramadol and exercises. Under orthopedic specialist care. Steroid medication prescribed but not yet taken due to potential side effects. Pain occurs after activity, not during. Discussed potential nerve involvement if symptoms persist. - Continue tramadol as needed for pain - Encourage completion of prescribed steroid course when feasible - Continue exercises as recommended by orthopedic specialist  Iron Deficiency Anemia Recent labs show no anemia and normal iron levels. Received an iron infusion on August 07, 2023. No current issues reported.  Follow-up Advised to follow up with primary care for further evaluation of fatigue and potential sleep apnea. - Follow up with sleep study results        Follow up plan: Return in about 6 months (around 02/09/2024) for cpe.

## 2023-08-10 ENCOUNTER — Encounter: Payer: Self-pay | Admitting: Nurse Practitioner

## 2023-08-10 ENCOUNTER — Other Ambulatory Visit: Payer: Self-pay | Admitting: Nurse Practitioner

## 2023-08-10 DIAGNOSIS — E559 Vitamin D deficiency, unspecified: Secondary | ICD-10-CM

## 2023-08-10 DIAGNOSIS — R5383 Other fatigue: Secondary | ICD-10-CM

## 2023-08-10 DIAGNOSIS — D649 Anemia, unspecified: Secondary | ICD-10-CM

## 2023-08-10 LAB — TSH: TSH: 1.2 m[IU]/L

## 2023-08-10 LAB — COMPLETE METABOLIC PANEL WITH GFR
AG Ratio: 2.2 (calc) (ref 1.0–2.5)
ALT: 28 U/L (ref 6–29)
AST: 25 U/L (ref 10–35)
Albumin: 4.4 g/dL (ref 3.6–5.1)
Alkaline phosphatase (APISO): 114 U/L (ref 37–153)
BUN: 13 mg/dL (ref 7–25)
CO2: 31 mmol/L (ref 20–32)
Calcium: 9.4 mg/dL (ref 8.6–10.4)
Chloride: 104 mmol/L (ref 98–110)
Creat: 0.59 mg/dL (ref 0.50–1.03)
Globulin: 2 g/dL (ref 1.9–3.7)
Glucose, Bld: 90 mg/dL (ref 65–99)
Potassium: 4 mmol/L (ref 3.5–5.3)
Sodium: 142 mmol/L (ref 135–146)
Total Bilirubin: 0.7 mg/dL (ref 0.2–1.2)
Total Protein: 6.4 g/dL (ref 6.1–8.1)
eGFR: 108 mL/min/{1.73_m2} (ref >=60)

## 2023-08-10 MED ORDER — ERGOCALCIFEROL 1.25 MG (50000 UT) PO CAPS: 50000.0000 [IU] | ORAL_CAPSULE | ORAL | 3 refills | Status: DC

## 2023-10-19 DIAGNOSIS — M791 Myalgia, unspecified site: Secondary | ICD-10-CM | POA: Diagnosis not present

## 2023-10-19 DIAGNOSIS — M1711 Unilateral primary osteoarthritis, right knee: Secondary | ICD-10-CM | POA: Diagnosis not present

## 2023-11-12 ENCOUNTER — Other Ambulatory Visit: Payer: Self-pay | Admitting: Nurse Practitioner

## 2023-11-12 DIAGNOSIS — I10 Essential (primary) hypertension: Secondary | ICD-10-CM

## 2023-11-14 NOTE — Telephone Encounter (Signed)
 Requested Prescriptions  Pending Prescriptions Disp Refills   lisinopril  (ZESTRIL ) 5 MG tablet [Pharmacy Med Name: LISINOPRIL  5 MG TABLET] 90 tablet 0    Sig: TAKE 1 TABLET (5 MG TOTAL) BY MOUTH DAILY.     Cardiovascular:  ACE Inhibitors Failed - 11/14/2023  4:39 PM      Failed - Valid encounter within last 6 months    Recent Outpatient Visits           3 months ago Other fatigue   [facility] [name], FNP       Future Appointments             In 3 months Abram Hoguet, Monalisa Angles, FNP [facility], [facility]            Passed - Cr in normal range and within 180 days    Creat  Date Value Ref Range Status  08/09/2023 0.59 0.50 - 1.03 mg/dL Final         Passed - K in normal range and within 180 days    Potassium  Date Value Ref Range Status  08/09/2023 4.0 3.5 - 5.3 mmol/L Final         Passed - Patient is not pregnant      Passed - Last BP in normal range    BP Readings from Last 1 Encounters:  08/09/23 134/86

## 2023-12-10 ENCOUNTER — Other Ambulatory Visit: Payer: Self-pay | Admitting: Nurse Practitioner

## 2023-12-10 DIAGNOSIS — F419 Anxiety disorder, unspecified: Secondary | ICD-10-CM

## 2023-12-12 NOTE — Telephone Encounter (Signed)
 Requested Prescriptions  Pending Prescriptions Disp Refills   hydrOXYzine  (VISTARIL ) 25 MG capsule [Pharmacy Med Name: HYDROXYZINE  PAM 25 MG CAP] 90 capsule 0    Sig: TAKE 1 CAPSULE (25 MG TOTAL) BY MOUTH EVERY 8 (EIGHT) HOURS AS NEEDED FOR ANXIETY.     Ear, Nose, and Throat:  Antihistamines 2 Passed - 12/12/2023  9:24 AM      Passed - Cr in normal range and within 360 days    Creat  Date Value Ref Range Status  08/09/2023 0.59 0.50 - 1.03 mg/dL Final         Passed - Valid encounter within last 12 months    Recent Outpatient Visits           4 months ago Other fatigue   [facility] Gareth Mliss FALCON, FNP       Future Appointments             In 2 months Gareth, Mliss FALCON, FNP [facility], [facility]

## 2024-01-03 ENCOUNTER — Telehealth: Payer: Self-pay | Admitting: Nurse Practitioner

## 2024-01-03 NOTE — Telephone Encounter (Signed)
 lisinopril  (ZESTRIL ) 5 MG tablet   90 day supply with 3 refills

## 2024-01-03 NOTE — Telephone Encounter (Signed)
 Refill not due

## 2024-01-05 ENCOUNTER — Other Ambulatory Visit: Payer: Self-pay | Admitting: Nurse Practitioner

## 2024-01-05 DIAGNOSIS — I10 Essential (primary) hypertension: Secondary | ICD-10-CM

## 2024-01-09 NOTE — Telephone Encounter (Signed)
 Requested by interface surecripts. Future visit in 1 month.  Requested Prescriptions  Pending Prescriptions Disp Refills   lisinopril  (ZESTRIL ) 5 MG tablet [Pharmacy Med Name: LISINOPRIL  5 MG TABLET] 90 tablet 0    Sig: TAKE 1 TABLET (5 MG TOTAL) BY MOUTH DAILY.     Cardiovascular:  ACE Inhibitors Passed - 01/09/2024  2:36 PM      Passed - Cr in normal range and within 180 days    Creat  Date Value Ref Range Status  08/09/2023 0.59 0.50 - 1.03 mg/dL Final         Passed - K in normal range and within 180 days    Potassium  Date Value Ref Range Status  08/09/2023 4.0 3.5 - 5.3 mmol/L Final         Passed - Patient is not pregnant      Passed - Last BP in normal range    BP Readings from Last 1 Encounters:  08/09/23 134/86         Passed - Valid encounter within last 6 months    Recent Outpatient Visits           5 months ago Other fatigue   [facility] Gareth Mliss FALCON, FNP       Future Appointments             In 1 month Gareth, Mliss FALCON, FNP [facility], [facility]

## 2024-01-16 ENCOUNTER — Other Ambulatory Visit: Payer: Self-pay | Admitting: Nurse Practitioner

## 2024-01-16 DIAGNOSIS — F419 Anxiety disorder, unspecified: Secondary | ICD-10-CM

## 2024-01-17 ENCOUNTER — Other Ambulatory Visit: Payer: Self-pay | Admitting: Nurse Practitioner

## 2024-01-17 DIAGNOSIS — I10 Essential (primary) hypertension: Secondary | ICD-10-CM

## 2024-01-17 DIAGNOSIS — K219 Gastro-esophageal reflux disease without esophagitis: Secondary | ICD-10-CM

## 2024-01-17 NOTE — Telephone Encounter (Unsigned)
 Copied from CRM #[id number]. Topic: Clinical - Medication Refill >> Jan 17, 2024 10:42 AM Tobias L wrote: Medication: omeprazole  (PRILOSEC) 40 MG capsule lisinopril  (ZESTRIL ) 5 MG tablet  Has the patient contacted their pharmacy? Yes Pharmacy calling on behalf of patient.   This is the patient's preferred pharmacy:  [facility] - [place], [place] - [address] [place] [address] Phone: [phone] Fax: [phone]  Is this the correct pharmacy for this prescription? Yes   Has the prescription been filled recently? No  Is the patient out of the medication? Unsure  Has the patient been seen for an appointment in the last year OR does the patient have an upcoming appointment? Yes  Can we respond through MyChart? No  Agent: Please be advised that Rx refills may take up to 3 business days. We ask that you follow-up with your pharmacy.

## 2024-01-18 MED ORDER — OMEPRAZOLE 40 MG PO CPDR: 40.0000 mg | DELAYED_RELEASE_CAPSULE | Freq: Every day | ORAL | 0 refills | Status: DC

## 2024-01-18 MED ORDER — LISINOPRIL 5 MG PO TABS: 5.0000 mg | ORAL_TABLET | Freq: Every day | ORAL | 0 refills | Status: DC

## 2024-01-18 NOTE — Telephone Encounter (Signed)
 Requested Prescriptions  Pending Prescriptions Disp Refills   lisinopril  (ZESTRIL ) 5 MG tablet 90 tablet 0    Sig: Take 1 tablet (5 mg total) by mouth daily.     Cardiovascular:  ACE Inhibitors Passed - 01/18/2024  2:06 PM      Passed - Cr in normal range and within 180 days    Creat  Date Value Ref Range Status  08/09/2023 0.59 0.50 - 1.03 mg/dL Final         Passed - K in normal range and within 180 days    Potassium  Date Value Ref Range Status  08/09/2023 4.0 3.5 - 5.3 mmol/L Final         Passed - Patient is not pregnant      Passed - Last BP in normal range    BP Readings from Last 1 Encounters:  08/09/23 134/86         Passed - Valid encounter within last 6 months    Recent Outpatient Visits           5 months ago Other fatigue   [facility] Gareth Mliss FALCON, FNP       Future Appointments             In 3 weeks Gareth Mliss FALCON, FNP [facility]             omeprazole  (PRILOSEC) 40 MG capsule 90 capsule 0    Sig: Take 1 capsule (40 mg total) by mouth daily.     Gastroenterology: Proton Pump Inhibitors Passed - 01/18/2024  2:06 PM      Passed - Valid encounter within last 12 months    Recent Outpatient Visits           5 months ago Other fatigue   [facility] Gareth Mliss FALCON, FNP       Future Appointments             In 3 weeks Gareth, Mliss FALCON, FNP [facility], [facility]

## 2024-01-18 NOTE — Telephone Encounter (Signed)
 Requested Prescriptions  Pending Prescriptions Disp Refills   hydrOXYzine  (VISTARIL ) 25 MG capsule [Pharmacy Med Name: HYDROXYZINE  PAM 25 MG CAP] 270 capsule 0    Sig: TAKE 1 CAPSULE (25 MG TOTAL) BY MOUTH EVERY 8 (EIGHT) HOURS AS NEEDED FOR ANXIETY.     Ear, Nose, and Throat:  Antihistamines 2 Passed - 01/18/2024  9:18 AM      Passed - Cr in normal range and within 360 days    Creat  Date Value Ref Range Status  08/09/2023 0.59 0.50 - 1.03 mg/dL Final         Passed - Valid encounter within last 12 months    Recent Outpatient Visits           5 months ago Other fatigue   [facility] Gareth Mliss FALCON, FNP       Future Appointments             In 3 weeks Gareth, Mliss FALCON, FNP [facility], [facility]

## 2024-02-01 DIAGNOSIS — M791 Myalgia, unspecified site: Secondary | ICD-10-CM | POA: Diagnosis not present

## 2024-02-01 DIAGNOSIS — M1711 Unilateral primary osteoarthritis, right knee: Secondary | ICD-10-CM | POA: Diagnosis not present

## 2024-02-05 ENCOUNTER — Inpatient Hospital Stay

## 2024-02-05 ENCOUNTER — Telehealth: Payer: Self-pay | Admitting: Internal Medicine

## 2024-02-05 NOTE — Telephone Encounter (Signed)
 Pt left vm that she was not feeling well and wanted to r/s lab appt to next week.  I called pt back and left a vm that I have r/s her lab to next Monday 9/15 at the same time and if this did not work to call the scheduling line. (Scheduling number given)

## 2024-02-05 NOTE — Telephone Encounter (Signed)
 Pt has lab today, wants to cancel appts on 9/15 due to being away for work. Pt stated she will r/s appts if her labs show she needs the infusion

## 2024-02-12 ENCOUNTER — Ambulatory Visit

## 2024-02-12 ENCOUNTER — Other Ambulatory Visit

## 2024-02-12 ENCOUNTER — Ambulatory Visit: Admitting: Internal Medicine

## 2024-02-13 ENCOUNTER — Ambulatory Visit: Payer: Self-pay

## 2024-02-13 ENCOUNTER — Encounter: Admitting: Nurse Practitioner

## 2024-02-13 NOTE — Progress Notes (Deleted)
 Name: [name]   MRN: [id number]    DOB: Jan 08, 1971   Date:02/13/2024       Progress Note  Subjective  Chief Complaint  No chief complaint on file.   HPI  Patient presents for annual CPE. Discussed the use of AI scribe software for clinical note transcription with the patient, who gave verbal consent to proceed.  History of Present Illness     Diet: *** Exercise: ***  Sleep: *** Last dental exam:*** Last eye exam: ***  Flowsheet Row Office Visit from 02/15/2023 in [facility]  AUDIT-C Score 0   Depression: Phq 9 is  {Desc; negative/positive:13464}    08/09/2023   10:18 AM 03/22/2023   10:56 AM 02/15/2023    7:51 AM 11/11/2022    2:42 PM 10/10/2022    2:11 PM  Depression screen PHQ 2/9  Decreased Interest 0  0 1 1  Down, Depressed, Hopeless 0  0 1 1  PHQ - 2 Score 0  0 2 2  Altered sleeping 3  0 0   Tired, decreased energy 3  1 1    Change in appetite 0  0 0   Feeling bad or failure about yourself  0  0 0   Trouble concentrating 1  1 1    Moving slowly or fidgety/restless 0  0 0   Suicidal thoughts 0  0 0   PHQ-9 Score 7  2 4    Difficult doing work/chores Not difficult at all  Not difficult at all Not difficult at all      Information is confidential and restricted. Go to Review Flowsheets to unlock data.   Hypertension: BP Readings from Last 3 Encounters:  08/09/23 134/86  08/07/23 (!) 143/92  08/07/23 (!) 153/96   Obesity: Wt Readings from Last 3 Encounters:  08/09/23 171 lb 14.4 oz (78 kg)  08/07/23 171 lb (77.6 kg)  02/15/23 167 lb 6.4 oz (75.9 kg)   BMI Readings from Last 3 Encounters:  08/09/23 28.17 kg/m  08/07/23 28.02 kg/m  02/15/23 27.43 kg/m     Vaccines:  HPV: up to at age 22 , ask insurance if age between 72-45  Shingrix: 48-64 yo and ask insurance if covered when patient above 24 yo Pneumonia:  educated and discussed with patient. Flu:  educated and discussed with patient.  Hep C Screening:  completed STD testing and prevention (HIV/chl/gon/syphilis): completed Intimate partner violence:*** Sexual History : Menstrual History/LMP/Abnormal Bleeding:  Incontinence Symptoms:   Breast cancer:  - Last Mammogram: 10/13/2022, ordered - BRCA gene screening: none  Osteoporosis: Discussed high calcium and vitamin D  supplementation, weight bearing exercises  Cervical cancer screening: 05/25/2022, due  Skin cancer: Discussed monitoring for atypical lesions  Colorectal cancer: 07/07/2022   Lung cancer:   Low Dose CT Chest recommended if Age 51-80 years, 20 pack-year currently smoking OR have quit w/in 15years. Patient does not qualify.   ECG: 12/18/2022  Advanced Care Planning: A voluntary discussion about advance care planning including the explanation and discussion of advance directives.  Discussed health care proxy and Living will, and the patient was able to identify a health care proxy as ***.  Patient {DOES_DOES WNU:81435} have a living will at present time. If patient does have living will, I have requested they bring this to the clinic to be scanned in to their chart.  Lipids: Lab Results  Component Value Date   CHOL 157 10/11/2021   Lab Results  Component Value Date   HDL 67 10/11/2021  Lab Results  Component Value Date   LDLCALC 73 10/11/2021   Lab Results  Component Value Date   TRIG 89 10/11/2021   Lab Results  Component Value Date   CHOLHDL 2.3 10/11/2021   No results found for: LDLDIRECT  Glucose: Glucose, Bld  Date Value Ref Range Status  08/09/2023 90 65 - 99 mg/dL Final    Comment:    .            Fasting reference interval .   01/21/2023 101 (H) 70 - 99 mg/dL Final    Comment:    Glucose reference range applies only to samples taken after fasting for at least 8 hours.  01/09/2023 122 (H) 70 - 99 mg/dL Final    Comment:    Glucose reference range applies only to samples taken after fasting for at least 8 hours.    Patient Active Problem  List   Diagnosis Date Noted   MDD (major depressive disorder), recurrent episode, severe (HCC) 04/10/2023   Difficulty coping 03/29/2023   Iron  deficiency anemia 02/15/2023   Other fatigue 02/15/2023   Gastroesophageal reflux disease without esophagitis 02/15/2023   Anxiety 07/26/2022   Symptomatic anemia 07/07/2022   Hypertension 10/11/2021   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   Moderate episode of recurrent major depressive disorder (HCC) 09/30/2021    Past Surgical History:  Procedure Laterality Date   ABDOMINAL HYSTERECTOMY     CHOLECYSTECTOMY     COLONOSCOPY WITH PROPOFOL  N/A 07/07/2022   Procedure: COLONOSCOPY WITH PROPOFOL ;  Surgeon: Therisa Bi, MD;  Location: [facility] ENDOSCOPY;  Service: Gastroenterology;  Laterality: N/A;   ESOPHAGOGASTRODUODENOSCOPY N/A 07/07/2022   Procedure: ESOPHAGOGASTRODUODENOSCOPY (EGD);  Surgeon: Therisa Bi, MD;  Location: [facility] ENDOSCOPY;  Service: Gastroenterology;  Laterality: N/A;   GASTRIC BYPASS      Family History  Problem Relation Age of Onset   Breast cancer Mother 80   Cancer Mother    Heart disease Father    Alcohol abuse Father    Diabetes Paternal Grandmother    Heart disease Brother    Anxiety disorder Brother     Social History   Socioeconomic History   Marital status: Married    Spouse name: Hassel   Number of children: 3   Years of education: Not on file   Highest education level: Some college, no degree  Occupational History   Not on file  Tobacco Use   Smoking status: Never   Smokeless tobacco: Never  Vaping Use   Vaping status: Never Used  Substance and Sexual Activity   Alcohol use: Yes    Comment: occassional/social   Drug use: Yes    Types: Marijuana   Sexual activity: Not on file  Other Topics Concern   Not on file  Social History Narrative   Moved from [place]  to [place] then went to [place]  and back in [place] for 7 years   Social Drivers of Corporate investment banker Strain: Low Risk   (05/25/2022)   Overall Financial Resource Strain (CARDIA)    Difficulty of Paying Living Expenses: Not hard at all  Food Insecurity: No Food Insecurity (10/10/2022)   Hunger Vital Sign    Worried About Running Out of Food in the Last Year: Never true    Ran Out of Food in the Last Year: Never true  Transportation Needs: Unknown (10/10/2022)   PRAPARE - Administrator, Civil Service (Medical): No    Lack of Transportation (Non-Medical): Not  on file  Physical Activity: Sufficiently Active (05/25/2022)   Exercise Vital Sign    Days of Exercise per Week: 4 days    Minutes of Exercise per Session: 60 min  Stress: Stress Concern Present (05/25/2022)   [employer] of Occupational Health - Occupational Stress Questionnaire    Feeling of Stress : To some extent  Social Connections: Moderately Integrated (05/25/2022)   Social Connection and Isolation Panel    Frequency of Communication with Friends and Family: Twice a week    Frequency of Social Gatherings with Friends and Family: Once a week    Attends Religious Services: 1 to 4 times per year    Active Member of [employer] or Organizations: No    Attends Banker Meetings: Never    Marital Status: Married  Catering manager Violence: Not At Risk (10/10/2022)   Humiliation, Afraid, Rape, and Kick questionnaire    Fear of Current or Ex-Partner: No    Emotionally Abused: No    Physically Abused: No    Sexually Abused: No     Current Outpatient Medications:    ergocalciferol  (VITAMIN D2) 1.25 MG (50000 UT) capsule, Take 1 capsule (50,000 Units total) by mouth once a week., Disp: 12 capsule, Rfl: 3   hydrOXYzine  (VISTARIL ) 25 MG capsule, TAKE 1 CAPSULE (25 MG TOTAL) BY MOUTH EVERY 8 (EIGHT) HOURS AS NEEDED FOR ANXIETY., Disp: 270 capsule, Rfl: 0   lisinopril  (ZESTRIL ) 5 MG tablet, Take 1 tablet (5 mg total) by mouth daily., Disp: 90 tablet, Rfl: 0   omeprazole  (PRILOSEC) 40 MG capsule, Take 1 capsule (40 mg total) by  mouth daily., Disp: 90 capsule, Rfl: 0   tiZANidine  (ZANAFLEX ) 4 MG tablet, Take 1 tablet (4 mg total) by mouth every 6 (six) hours as needed for muscle spasms., Disp: 90 tablet, Rfl: 1   traMADol  (ULTRAM ) 50 MG tablet, Take by mouth., Disp: , Rfl:   No Known Allergies   ROS  ***  Objective  There were no vitals filed for this visit.  There is no height or weight on file to calculate BMI.  Physical Exam ***  No results found for this or any previous visit (from the past 2160 hours).  Diabetic Foot Exam: Diabetic Foot Exam - Simple   No data filed    ***  Fall Risk:    02/15/2023    7:51 AM 11/11/2022    2:41 PM 09/05/2022   11:00 AM 07/26/2022    2:43 PM 06/24/2022    2:06 PM  Fall Risk   Falls in the past year? 0 1 0 1 0  Number falls in past yr: 0 0 0 1 0  Injury with Fall? 0 1 0 1 0  Risk for fall due to : No Fall Risks   History of fall(s) No Fall Risks  Follow up     Falls prevention discussed   ***  Functional Status Survey:   ***  Assessment & Plan  There are no diagnoses linked to this encounter.  -USPSTF grade A and B recommendations reviewed with patient; age-appropriate recommendations, preventive care, screening tests, etc discussed and encouraged; healthy living encouraged; see AVS for patient education given to patient -Discussed importance of 150 minutes of physical activity weekly, eat two servings of fish weekly, eat one serving of tree nuts ( cashews, pistachios, pecans, almonds.SABRA) every other day, eat 6 servings of fruit/vegetables daily and drink plenty of water and avoid sweet beverages.   -Reviewed Health Maintenance: ***

## 2024-02-13 NOTE — Telephone Encounter (Signed)
 Attempted to contact patient x 2 to discuss symptoms. Phone goes straight to VM. LVM, will attempt contact at a later time.

## 2024-02-13 NOTE — Telephone Encounter (Signed)
 Attempted to contact patient x 3 to discuss symptoms. LVM, will route to office for follow-up.

## 2024-02-13 NOTE — Telephone Encounter (Signed)
 Attempted to contact patient x 1 to discuss symptoms. Patient requested to be called after 2 pm; LVM, will attempt contact at a later time.        Message from [facility] sent at 02/13/2024  7:57 AM EDT  Pt has a cold. She says she is experiencing congestion, headache which is a 5/10, and a sore throat. Symptoms started overnight. She says she does not have any covid tests to check for covid. Has not checked her temp for a fever does not feel feverish. She is asking to be called after 2pm. States she wants to sleep

## 2024-02-14 NOTE — Telephone Encounter (Signed)
 Spoke with pt and she is feeling a little better. Will call back to schedule if need She already has appointment for next week

## 2024-02-20 ENCOUNTER — Inpatient Hospital Stay: Attending: Internal Medicine

## 2024-02-20 DIAGNOSIS — E559 Vitamin D deficiency, unspecified: Secondary | ICD-10-CM

## 2024-02-20 DIAGNOSIS — D649 Anemia, unspecified: Secondary | ICD-10-CM

## 2024-02-20 DIAGNOSIS — D509 Iron deficiency anemia, unspecified: Secondary | ICD-10-CM | POA: Insufficient documentation

## 2024-02-20 LAB — FERRITIN: Ferritin: 44 ng/mL (ref 11–307)

## 2024-02-20 LAB — CBC WITH DIFFERENTIAL/PLATELET
Abs Immature Granulocytes: 0.01 K/uL (ref 0.00–0.07)
Basophils Absolute: 0 K/uL (ref 0.0–0.1)
Basophils Relative: 0 %
Eosinophils Absolute: 0.2 K/uL (ref 0.0–0.5)
Eosinophils Relative: 3 %
HCT: 43.1 % (ref 36.0–46.0)
Hemoglobin: 14.2 g/dL (ref 12.0–15.0)
Immature Granulocytes: 0 %
Lymphocytes Relative: 32 %
Lymphs Abs: 1.6 K/uL (ref 0.7–4.0)
MCH: 29.3 pg (ref 26.0–34.0)
MCHC: 32.9 g/dL (ref 30.0–36.0)
MCV: 88.9 fL (ref 80.0–100.0)
Monocytes Absolute: 0.3 K/uL (ref 0.1–1.0)
Monocytes Relative: 6 %
Neutro Abs: 2.9 K/uL (ref 1.7–7.7)
Neutrophils Relative %: 59 %
Platelets: 301 K/uL (ref 150–400)
RBC: 4.85 MIL/uL (ref 3.87–5.11)
RDW: 12.5 % (ref 11.5–15.5)
WBC: 4.9 K/uL (ref 4.0–10.5)
nRBC: 0 % (ref 0.0–0.2)

## 2024-02-20 LAB — IRON AND TIBC
Iron: 77 ug/dL (ref 28–170)
Saturation Ratios: 19 % (ref 10.4–31.8)
TIBC: 410 ug/dL (ref 250–450)
UIBC: 333 ug/dL

## 2024-02-20 LAB — VITAMIN B12: Vitamin B-12: 667 pg/mL (ref 180–914)

## 2024-02-20 LAB — VITAMIN D 25 HYDROXY (VIT D DEFICIENCY, FRACTURES): Vit D, 25-Hydroxy: 27.89 ng/mL — ABNORMAL LOW (ref 30–100)

## 2024-02-21 ENCOUNTER — Encounter: Payer: Self-pay | Admitting: Nurse Practitioner

## 2024-02-21 ENCOUNTER — Other Ambulatory Visit (HOSPITAL_COMMUNITY)
Admission: RE | Admit: 2024-02-21 | Discharge: 2024-02-21 | Disposition: A | Source: Ambulatory Visit | Attending: Nurse Practitioner | Admitting: Nurse Practitioner

## 2024-02-21 ENCOUNTER — Other Ambulatory Visit: Payer: Self-pay | Admitting: Medical Genetics

## 2024-02-21 ENCOUNTER — Ambulatory Visit: Admitting: Nurse Practitioner

## 2024-02-21 VITALS — BP 142/82 | HR 62 | Temp 98.5°F | Resp 18 | Ht 65.5 in | Wt 173.6 lb

## 2024-02-21 DIAGNOSIS — Z124 Encounter for screening for malignant neoplasm of cervix: Secondary | ICD-10-CM | POA: Insufficient documentation

## 2024-02-21 DIAGNOSIS — Z1231 Encounter for screening mammogram for malignant neoplasm of breast: Secondary | ICD-10-CM

## 2024-02-21 DIAGNOSIS — Z23 Encounter for immunization: Secondary | ICD-10-CM

## 2024-02-21 DIAGNOSIS — Z0000 Encounter for general adult medical examination without abnormal findings: Secondary | ICD-10-CM

## 2024-02-21 MED ORDER — COVID-19 MRNA VAC-TRIS(PFIZER) 30 MCG/0.3ML IM SUSY: 0.3000 mL | PREFILLED_SYRINGE | Freq: Once | INTRAMUSCULAR | 0 refills | Status: AC

## 2024-02-21 NOTE — Progress Notes (Signed)
 Name: [name]   MRN: [id number]    DOB: 1971/05/14   Date:02/21/2024       Progress Note  Subjective  Chief Complaint  Chief Complaint  Patient presents with   Annual Exam    W/ pap    HPI  Patient presents for annual CPE.  Diet: Regular diet; trying to eat healthier Exercise: 3 days per week Sleep: 6 hours of sleep per night Last dental exam: 2 years ago Last eye exam: 2 years ago  [employer] Visit from 02/21/2024 in [facility]  AUDIT-C Score 0   Depression: Phq 9 is  negative    02/21/2024    8:00 AM 08/09/2023   10:18 AM 03/22/2023   10:56 AM 02/15/2023    7:51 AM 11/11/2022    2:42 PM  Depression screen PHQ 2/9  Decreased Interest 0 0  0 1  Down, Depressed, Hopeless 0 0  0 1  PHQ - 2 Score 0 0  0 2  Altered sleeping 1 3  0 0  Tired, decreased energy 1 3  1 1   Change in appetite 1 0  0 0  Feeling bad or failure about yourself  0 0  0 0  Trouble concentrating 1 1  1 1   Moving slowly or fidgety/restless 0 0  0 0  Suicidal thoughts 0 0  0 0  PHQ-9 Score 4 7  2 4   Difficult doing work/chores  Not difficult at all  Not difficult at all Not difficult at all     Information is confidential and restricted. Go to Review Flowsheets to unlock data.    GAD-7 is negative for anxiety    02/21/2024    8:23 AM 08/09/2023   10:20 AM 02/15/2023    7:52 AM 11/11/2022    2:42 PM  GAD 7 : Generalized Anxiety Score  Nervous, Anxious, on Edge 0 0 0 0  Control/stop worrying 0 0 0 0  Worry too much - different things 0 1 0 0  Trouble relaxing 0 0 1 0  Restless 0 0 0 0  Easily annoyed or irritable 0 1 0 0  Afraid - awful might happen 0 0 0 0  Total GAD 7 Score 0 2 1 0  Anxiety Difficulty Not difficult at all Somewhat difficult Not difficult at all Not difficult at all     Hypertension: BP Readings from Last 3 Encounters:  02/21/24 (!) 142/82  08/09/23 134/86  08/07/23 (!) 143/92   Obesity: Wt Readings from Last 3 Encounters:   02/21/24 173 lb 9.6 oz (78.7 kg)  08/09/23 171 lb 14.4 oz (78 kg)  08/07/23 171 lb (77.6 kg)   BMI Readings from Last 3 Encounters:  02/21/24 28.45 kg/m  08/09/23 28.17 kg/m  08/07/23 28.02 kg/m     Vaccines:  HPV: up to at age 53 , ask insurance if age between 33-45. Does not qualify. Shingrix: 74-64 yo and ask insurance if covered when patient above 60 yo Pneumonia: Educated and discussed with patient. Will receive during this visit. Flu: Educated and discussed with patient. Will receive during this visit.  Covid booster: Educated and discussed with patient. Will receive during this visit.   Hep C Screening: Completed 05/25/2022 STD testing and prevention (HIV/chl/gon/syphilis): Completed 05/25/2022 Intimate partner violence: No Sexual History : Sexually active with 1 partner (husband). Menstrual History/LMP/Abnormal Bleeding:  Previous hysterectomy; Denies any abnormal bleeding. Incontinence Symptoms: None  Breast cancer:  - Last Mammogram: Last completed  10/13/2022; Patient aware that mammogram is due this year and will schedule appointment. - BRCA gene screening: None  Osteoporosis: Discussed high calcium and vitamin D  supplementation, weight bearing exercises  Cervical cancer screening: Pap smear completed during this visit. Abnormal Pap smear on 05/25/2022 showing atypical squamous cells.  Skin cancer: Discussed monitoring for atypical lesions  Colorectal cancer: Last completed 07/07/2022 Lung cancer:  Low Dose CT Chest recommended if Age 75-80 years, 20 pack-year currently smoking OR have quit w/in 15years. Patient does not qualify.   ECG: 12/18/2022  Advanced Care Planning: A voluntary discussion about advance care planning including the explanation and discussion of advance directives.  Discussed health care proxy and Living will, and the patient was able to identify a health care proxy as Hassel Bullock (spouse).  Patient does not have a living will at present  time. If patient does have living will, I have requested they bring this to the clinic to be scanned in to their chart.  Lipids: Lab Results  Component Value Date   CHOL 157 10/11/2021   Lab Results  Component Value Date   HDL 67 10/11/2021   Lab Results  Component Value Date   LDLCALC 73 10/11/2021   Lab Results  Component Value Date   TRIG 89 10/11/2021   Lab Results  Component Value Date   CHOLHDL 2.3 10/11/2021   No results found for: LDLDIRECT  Glucose: Glucose, Bld  Date Value Ref Range Status  08/09/2023 90 65 - 99 mg/dL Final    Comment:    .            Fasting reference interval .   01/21/2023 101 (H) 70 - 99 mg/dL Final    Comment:    Glucose reference range applies only to samples taken after fasting for at least 8 hours.  01/09/2023 122 (H) 70 - 99 mg/dL Final    Comment:    Glucose reference range applies only to samples taken after fasting for at least 8 hours.    Patient Active Problem List   Diagnosis Date Noted   MDD (major depressive disorder), recurrent episode, severe (HCC) 04/10/2023   Difficulty coping 03/29/2023   Iron  deficiency anemia 02/15/2023   Other fatigue 02/15/2023   Gastroesophageal reflux disease without esophagitis 02/15/2023   Anxiety 07/26/2022   Symptomatic anemia 07/07/2022   Hypertension 10/11/2021   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   Moderate episode of recurrent major depressive disorder (HCC) 09/30/2021    Past Surgical History:  Procedure Laterality Date   ABDOMINAL HYSTERECTOMY     CHOLECYSTECTOMY     COLONOSCOPY WITH PROPOFOL  N/A 07/07/2022   Procedure: COLONOSCOPY WITH PROPOFOL ;  Surgeon: Therisa Bi, MD;  Location: [facility] ENDOSCOPY;  Service: Gastroenterology;  Laterality: N/A;   ESOPHAGOGASTRODUODENOSCOPY N/A 07/07/2022   Procedure: ESOPHAGOGASTRODUODENOSCOPY (EGD);  Surgeon: Therisa Bi, MD;  Location: [facility] ENDOSCOPY;  Service: Gastroenterology;  Laterality: N/A;   GASTRIC BYPASS       Family History  Problem Relation Age of Onset   Breast cancer Mother 15   Cancer Mother    Heart disease Father    Alcohol abuse Father    Diabetes Paternal Grandmother    Heart disease Brother    Anxiety disorder Brother     Social History   Socioeconomic History   Marital status: Married    Spouse name: Hassel   Number of children: 3   Years of education: Not on file   Highest education level: Some college, no degree  Occupational History   Not on file  Tobacco Use   Smoking status: Never   Smokeless tobacco: Never  Vaping Use   Vaping status: Never Used  Substance and Sexual Activity   Alcohol use: Yes    Comment: occassional/social   Drug use: Yes    Types: Marijuana   Sexual activity: Not on file  Other Topics Concern   Not on file  Social History Narrative   Moved from [place]  to [place] then went to [place]  and back in [place] for 7 years   Social Drivers of Corporate investment banker Strain: Low Risk  (02/21/2024)   Overall Financial Resource Strain (CARDIA)    Difficulty of Paying Living Expenses: Not hard at all  Food Insecurity: No Food Insecurity (02/21/2024)   Hunger Vital Sign    Worried About Running Out of Food in the Last Year: Never true    Ran Out of Food in the Last Year: Never true  Transportation Needs: No Transportation Needs (02/21/2024)   PRAPARE - Administrator, Civil Service (Medical): No    Lack of Transportation (Non-Medical): No  Physical Activity: Insufficiently Active (02/21/2024)   Exercise Vital Sign    Days of Exercise per Week: 4 days    Minutes of Exercise per Session: 30 min  Stress: No Stress Concern Present (02/21/2024)   [employer] of Occupational Health - Occupational Stress Questionnaire    Feeling of Stress: Not at all  Social Connections: Moderately Integrated (05/25/2022)   Social Connection and Isolation Panel    Frequency of Communication with Friends and Family: Twice a week    Frequency  of Social Gatherings with Friends and Family: Once a week    Attends Religious Services: 1 to 4 times per year    Active Member of [employer] or Organizations: No    Attends Banker Meetings: Never    Marital Status: Married  Catering manager Violence: Not At Risk (02/21/2024)   Humiliation, Afraid, Rape, and Kick questionnaire    Fear of Current or Ex-Partner: No    Emotionally Abused: No    Physically Abused: No    Sexually Abused: No     Current Outpatient Medications:    COVID-19 mRNA vaccine, Pfizer, (COMIRNATY) syringe, Inject 0.3 mLs into the muscle once for 1 dose., Disp: 0.3 mL, Rfl: 0   ergocalciferol  (VITAMIN D2) 1.25 MG (50000 UT) capsule, Take 1 capsule (50,000 Units total) by mouth once a week., Disp: 12 capsule, Rfl: 3   lisinopril  (ZESTRIL ) 5 MG tablet, Take 1 tablet (5 mg total) by mouth daily., Disp: 90 tablet, Rfl: 0   omeprazole  (PRILOSEC) 40 MG capsule, Take 1 capsule (40 mg total) by mouth daily., Disp: 90 capsule, Rfl: 0   tiZANidine  (ZANAFLEX ) 4 MG tablet, Take 1 tablet (4 mg total) by mouth every 6 (six) hours as needed for muscle spasms., Disp: 90 tablet, Rfl: 1   traMADol  (ULTRAM ) 50 MG tablet, Take by mouth., Disp: , Rfl:    hydrOXYzine  (VISTARIL ) 25 MG capsule, TAKE 1 CAPSULE (25 MG TOTAL) BY MOUTH EVERY 8 (EIGHT) HOURS AS NEEDED FOR ANXIETY. (Patient not taking: Reported on 02/21/2024), Disp: 270 capsule, Rfl: 0  No Known Allergies   ROS  Constitutional: Negative for fever or weight change.  Respiratory: Negative for cough and shortness of breath.   Cardiovascular: Negative for chest pain or palpitations.  Gastrointestinal: Negative for abdominal pain, no bowel changes.  Musculoskeletal: Negative for gait problem or  joint swelling.  Skin: Negative for rash.  Neurological: Negative for dizziness or headache.  No other specific complaints in a complete review of systems (except as listed in HPI above).  Objective  Vitals:   02/21/24 0821  BP:  (!) 142/82  Pulse: 62  Resp: 18  Temp: 98.5 F (36.9 C)  SpO2: 98%  Weight: 173 lb 9.6 oz (78.7 kg)  Height: 5' 5.5 (1.664 m)    Body mass index is 28.45 kg/m.  Physical Exam Exam conducted with a chaperone present.  Constitutional:      Appearance: Normal appearance.  HENT:     Head: Normocephalic and atraumatic.     Right Ear: Tympanic membrane, ear canal and external ear normal.     Left Ear: Tympanic membrane, ear canal and external ear normal.     Nose: Nose normal.     Mouth/Throat:     Mouth: Mucous membranes are moist.     Pharynx: Oropharynx is clear.  Eyes:     Extraocular Movements: Extraocular movements intact.     Conjunctiva/sclera: Conjunctivae normal.     Pupils: Pupils are equal, round, and reactive to light.  Cardiovascular:     Rate and Rhythm: Normal rate and regular rhythm.     Heart sounds: Normal heart sounds.  Pulmonary:     Effort: Pulmonary effort is normal.     Breath sounds: Normal breath sounds.  Abdominal:     General: Bowel sounds are normal.     Palpations: Abdomen is soft.  Genitourinary:    General: Normal vulva.     Exam position: Lithotomy position.     Tanner stage (genital): 5.     Vagina: Normal.     Cervix: Normal.     Rectum: Normal.  Musculoskeletal:        General: Normal range of motion.     Cervical back: Normal range of motion and neck supple.  Skin:    General: Skin is warm and dry.     Capillary Refill: Capillary refill takes less than 2 seconds.  Neurological:     General: No focal deficit present.     Mental Status: She is alert and oriented to person, place, and time. Mental status is at baseline.  Psychiatric:        Mood and Affect: Mood normal.        Behavior: Behavior normal.        Thought Content: Thought content normal.        Judgment: Judgment normal.     Recent Results (from the past 2160 hours)  VITAMIN D  25 Hydroxy (Vit-D Deficiency, Fractures)     Status: Abnormal   Collection Time:  02/20/24 12:45 PM  Result Value Ref Range   Vit D, 25-Hydroxy 27.89 (L) 30 - 100 ng/mL    Comment: (NOTE) Vitamin D  deficiency has been defined by the Institute of Medicine  and an Endocrine Society practice guideline as a level of serum 25-OH  vitamin D  less than 20 ng/mL (1,2). The Endocrine Society went on to  further define vitamin D  insufficiency as a level between 21 and 29  ng/mL (2).  1. IOM (Institute of Medicine). 2010. Dietary reference intakes for  calcium and D. [place]: [employer]. 2. Holick MF, Binkley [place], Bischoff-Ferrari HA, et al. Evaluation,  treatment, and prevention of vitamin D  deficiency: an Endocrine  Society clinical practice guideline, JCEM. 2011 Jul; 96(7): 1911-30.  Performed at [facility], [address] [place], [place] [address]   Iron  and TIBC     Status: None   Collection Time: 02/20/24 12:45 PM  Result Value Ref Range   Iron  77 28 - 170 ug/dL   TIBC 589 749 - 549 ug/dL   Saturation Ratios 19 10.4 - 31.8 %   UIBC 333 ug/dL    Comment: Performed at [facility], [address] [place], [place] [address]  Ferritin     Status: None   Collection Time: 02/20/24 12:45 PM  Result Value Ref Range   Ferritin 44 11 - 307 ng/mL    Comment: Performed at [facility], [address] [place], [place] [address]  CBC with Differential/Platelet     Status: None   Collection Time: 02/20/24 12:45 PM  Result Value Ref Range   WBC 4.9 4.0 - 10.5 K/uL   RBC 4.85 3.87 - 5.11 MIL/uL   Hemoglobin 14.2 12.0 - 15.0 g/dL   HCT 56.8 63.9 - 53.9 %   MCV 88.9 80.0 - 100.0 fL   MCH 29.3 26.0 - 34.0 pg   MCHC 32.9 30.0 - 36.0 g/dL   RDW 87.4 88.4 - 84.4 %   Platelets 301 150 - 400 K/uL   nRBC 0.0 0.0 - 0.2 %   Neutrophils Relative % 59 %   Neutro Abs 2.9 1.7 - 7.7 K/uL   Lymphocytes Relative 32 %   Lymphs Abs 1.6 0.7 - 4.0 K/uL   Monocytes Relative 6 %   Monocytes Absolute 0.3 0.1 - 1.0 K/uL   Eosinophils  Relative 3 %   Eosinophils Absolute 0.2 0.0 - 0.5 K/uL   Basophils Relative 0 %   Basophils Absolute 0.0 0.0 - 0.1 K/uL   Immature Granulocytes 0 %   Abs Immature Granulocytes 0.01 0.00 - 0.07 K/uL    Comment: Performed at [facility], [address] [place], [place] [address]  Vitamin B12     Status: None   Collection Time: 02/20/24 12:45 PM  Result Value Ref Range   Vitamin B-12 667 180 - 914 pg/mL    Comment: (NOTE) This assay is not validated for testing neonatal or myeloproliferative syndrome specimens for Vitamin B12 levels. Performed at [facility], [address], [place], [place] [address]      Fall Risk:    02/21/2024    8:23 AM 02/15/2023    7:51 AM 11/11/2022    2:41 PM 09/05/2022   11:00 AM 07/26/2022    2:43 PM  Fall Risk   Falls in the past year? 0 0 1 0 1  Number falls in past yr: 0 0 0 0 1  Injury with Fall? 0 0 1 0 1  Risk for fall due to :  No Fall Risks   History of fall(s)  Follow up Falls evaluation completed        Functional Status Survey: Is the patient deaf or have difficulty hearing?: No Does the patient have difficulty seeing, even when wearing glasses/contacts?: No Does the patient have difficulty concentrating, remembering, or making decisions?: No Does the patient have difficulty walking or climbing stairs?: No Does the patient have difficulty dressing or bathing?: No Does the patient have difficulty doing errands alone such as visiting a doctor's office or shopping?: No  Assessment & Plan  Problem List Items Addressed This Visit   None Visit Diagnoses       Immunization due    -  Primary   Relevant Medications  COVID-19 mRNA vaccine, Pfizer, (COMIRNATY) syringe   Other Relevant Orders   Pneumococcal conjugate vaccine 20-valent (Prevnar 20) (Completed)   Flu vaccine trivalent PF, 6mos and older(Flulaval,Afluria,Fluarix,Fluzone) (Completed)     Encounter for screening mammogram for malignant neoplasm of breast        Relevant Orders   MM 3D SCREENING MAMMOGRAM BILATERAL BREAST     Screening for cervical cancer       Pap smear completed. Previous Pap smear on 05/25/2022 was abnormal.   Relevant Orders   Cytology - PAP     Annual physical exam           -USPSTF grade A and B recommendations reviewed with patient; age-appropriate recommendations, preventive care, screening tests, etc discussed and encouraged; healthy living encouraged; see AVS for patient education given to patient -Discussed importance of 150 minutes of physical activity weekly, eat two servings of fish weekly, eat one serving of tree nuts ( cashews, pistachios, pecans, almonds.SABRA) every other day, eat 6 servings of fruit/vegetables daily and drink plenty of water and avoid sweet beverages.   -Reviewed Health Maintenance: Yes  I have reviewed this encounter including the documentation in this note and/or discussed this patient with the provider, Alexa Everhart SNP, I am certifying that I agree with the content of this note as supervising/preceptor nurse practitioner.  Mliss Spray, FNP-C [facility] 02/21/2024, 10:27 AM

## 2024-02-23 ENCOUNTER — Other Ambulatory Visit: Payer: Self-pay | Admitting: *Deleted

## 2024-02-23 DIAGNOSIS — D649 Anemia, unspecified: Secondary | ICD-10-CM

## 2024-02-25 ENCOUNTER — Ambulatory Visit: Payer: Self-pay | Admitting: Nurse Practitioner

## 2024-02-26 ENCOUNTER — Ambulatory Visit: Payer: Self-pay | Admitting: Nurse Practitioner

## 2024-02-26 LAB — CYTOLOGY - PAP
Adequacy: ABSENT
Comment: NEGATIVE
Diagnosis: NEGATIVE
High risk HPV: NEGATIVE

## 2024-02-27 ENCOUNTER — Telehealth: Payer: Self-pay | Admitting: Nurse Practitioner

## 2024-02-27 ENCOUNTER — Inpatient Hospital Stay: Admitting: Nurse Practitioner

## 2024-02-27 ENCOUNTER — Inpatient Hospital Stay

## 2024-02-27 NOTE — Telephone Encounter (Signed)
 [name] sent a message through our answering service to cancel her appt today @ 2 pm for MD/ Infusion. Team was notified.

## 2024-03-02 ENCOUNTER — Other Ambulatory Visit

## 2024-03-12 ENCOUNTER — Observation Stay
Admission: EM | Admit: 2024-03-12 | Discharge: 2024-03-13 | Disposition: A | Discharge status: Home / Self Care | Attending: Student | Admitting: Student

## 2024-03-12 ENCOUNTER — Emergency Department

## 2024-03-12 ENCOUNTER — Encounter: Payer: Self-pay | Admitting: Emergency Medicine

## 2024-03-12 ENCOUNTER — Other Ambulatory Visit: Payer: Self-pay

## 2024-03-12 ENCOUNTER — Ambulatory Visit: Payer: Self-pay

## 2024-03-12 DIAGNOSIS — R079 Chest pain, unspecified: Principal | ICD-10-CM | POA: Diagnosis present

## 2024-03-12 DIAGNOSIS — R0789 Other chest pain: Principal | ICD-10-CM | POA: Insufficient documentation

## 2024-03-12 DIAGNOSIS — Z79899 Other long term (current) drug therapy: Secondary | ICD-10-CM | POA: Insufficient documentation

## 2024-03-12 DIAGNOSIS — I10 Essential (primary) hypertension: Secondary | ICD-10-CM | POA: Insufficient documentation

## 2024-03-12 DIAGNOSIS — K219 Gastro-esophageal reflux disease without esophagitis: Secondary | ICD-10-CM

## 2024-03-12 DIAGNOSIS — Z905 Acquired absence of kidney: Secondary | ICD-10-CM | POA: Insufficient documentation

## 2024-03-12 LAB — BASIC METABOLIC PANEL WITH GFR
Anion gap: 13 (ref 5–15)
BUN: 9 mg/dL (ref 6–20)
CO2: 25 mmol/L (ref 22–32)
Calcium: 9 mg/dL (ref 8.9–10.3)
Chloride: 105 mmol/L (ref 98–111)
Creatinine, Ser: 0.74 mg/dL (ref 0.44–1.00)
GFR, Estimated: >60 mL/min (ref >60)
Glucose, Bld: 99 mg/dL (ref 70–99)
Potassium: 3.4 mmol/L — ABNORMAL LOW (ref 3.5–5.1)
Sodium: 143 mmol/L (ref 135–145)

## 2024-03-12 LAB — HEPATIC FUNCTION PANEL
ALT: 31 U/L (ref 0–44)
AST: 30 U/L (ref 15–41)
Albumin: 4.2 g/dL (ref 3.5–5.0)
Alkaline Phosphatase: 100 U/L (ref 38–126)
Bilirubin, Direct: 0.2 mg/dL (ref 0.0–0.2)
Indirect Bilirubin: 0.8 mg/dL (ref 0.3–0.9)
Total Bilirubin: 1 mg/dL (ref 0.0–1.2)
Total Protein: 6.9 g/dL (ref 6.5–8.1)

## 2024-03-12 LAB — CBC
HCT: 41.4 % (ref 36.0–46.0)
Hemoglobin: 14 g/dL (ref 12.0–15.0)
MCH: 29.9 pg (ref 26.0–34.0)
MCHC: 33.8 g/dL (ref 30.0–36.0)
MCV: 88.5 fL (ref 80.0–100.0)
Platelets: 257 K/uL (ref 150–400)
RBC: 4.68 MIL/uL (ref 3.87–5.11)
RDW: 12.4 % (ref 11.5–15.5)
WBC: 5.4 K/uL (ref 4.0–10.5)
nRBC: 0 % (ref 0.0–0.2)

## 2024-03-12 LAB — D-DIMER, QUANTITATIVE: D-Dimer, Quant: <0.27 ug{FEU}/mL (ref 0.00–0.50)

## 2024-03-12 LAB — MAGNESIUM: Magnesium: 2.3 mg/dL (ref 1.7–2.4)

## 2024-03-12 LAB — HEMOGLOBIN A1C
Hgb A1c MFr Bld: 5.1 % (ref 4.8–5.6)
Mean Plasma Glucose: 99.67 mg/dL

## 2024-03-12 LAB — LIPID PANEL
Cholesterol: 170 mg/dL (ref 0–200)
HDL: 74 mg/dL (ref >40)
LDL Cholesterol: 84 mg/dL (ref 0–99)
Total CHOL/HDL Ratio: 2.3 ratio
Triglycerides: 60 mg/dL (ref <150)
VLDL: 12 mg/dL (ref 0–40)

## 2024-03-12 LAB — TROPONIN I (HIGH SENSITIVITY)
Troponin I (High Sensitivity): 5 ng/L (ref <18)
Troponin I (High Sensitivity): 4 ng/L (ref <18)

## 2024-03-12 MED ORDER — ALUM & MAG HYDROXIDE-SIMETH 200-200-20 MG/5ML PO SUSP
30.0000 mL | Freq: Once | ORAL | Status: AC
  Administered 2024-03-12: 30 mL via ORAL
  Filled 2024-03-12: qty 30

## 2024-03-12 MED ORDER — KETOROLAC TROMETHAMINE 30 MG/ML IJ SOLN
30.0000 mg | Freq: Once | INTRAMUSCULAR | Status: AC
  Administered 2024-03-12: 30 mg via INTRAMUSCULAR
  Filled 2024-03-12: qty 1

## 2024-03-12 MED ORDER — ENOXAPARIN SODIUM 40 MG/0.4ML IJ SOSY
40.0000 mg | PREFILLED_SYRINGE | INTRAMUSCULAR | Status: DC
  Administered 2024-03-12: 40 mg via SUBCUTANEOUS
  Filled 2024-03-12: qty 0.4

## 2024-03-12 MED ORDER — SENNOSIDES-DOCUSATE SODIUM 8.6-50 MG PO TABS: 1.0000 | ORAL_TABLET | Freq: Every evening | ORAL | Status: DC | PRN

## 2024-03-12 MED ORDER — POTASSIUM CHLORIDE 20 MEQ PO PACK
60.0000 meq | PACK | Freq: Once | ORAL | Status: AC
  Administered 2024-03-12: 60 meq via ORAL
  Filled 2024-03-12: qty 3

## 2024-03-12 MED ORDER — ACETAMINOPHEN 650 MG RE SUPP: 650.0000 mg | Freq: Four times a day (QID) | RECTAL | Status: DC | PRN

## 2024-03-12 MED ORDER — LIDOCAINE VISCOUS HCL 2 % MT SOLN
15.0000 mL | Freq: Once | OROMUCOSAL | Status: AC
  Administered 2024-03-12: 15 mL via ORAL
  Filled 2024-03-12: qty 15

## 2024-03-12 MED ORDER — ACETAMINOPHEN 325 MG PO TABS
650.0000 mg | ORAL_TABLET | Freq: Four times a day (QID) | ORAL | Status: DC | PRN
  Administered 2024-03-13: 650 mg via ORAL
  Filled 2024-03-12: qty 2

## 2024-03-12 MED ORDER — LIDOCAINE 5 % EX PTCH
1.0000 | MEDICATED_PATCH | CUTANEOUS | Status: DC
  Administered 2024-03-12: 1 via TRANSDERMAL
  Filled 2024-03-12: qty 1

## 2024-03-12 MED ORDER — FAMOTIDINE 20 MG PO TABS
20.0000 mg | ORAL_TABLET | Freq: Once | ORAL | Status: AC
  Administered 2024-03-12: 20 mg via ORAL
  Filled 2024-03-12: qty 1

## 2024-03-12 MED ORDER — NITROGLYCERIN 0.4 MG SL SUBL
0.4000 mg | SUBLINGUAL_TABLET | Freq: Once | SUBLINGUAL | Status: AC
  Administered 2024-03-12: 0.4 mg via SUBLINGUAL
  Filled 2024-03-12: qty 1

## 2024-03-12 NOTE — ED Provider Notes (Signed)
 [facility] Provider Note    Event Date/Time   First MD Initiated Contact with Patient 03/12/24 [phone]     (approximate)   History   Chest Pain   HPI  [name] is a 53 y.o. female no significant past medical history presents to the emergency department chest pain.  Endorses chest pain that started on Sunday.  Right sided chest pain that she describes as a dull aching feeling.  Went away throughout the day.  Came back later that night while she was on a walk with her dogs.  Also reoccurred while she was sitting at rest.  This morning got up to work and started having another episode of chest pain which brought her into the emergency department after calling her primary care provider.  Denies significant shortness of breath.  Does have some mild worsening of symptoms with deep inspiration.  Denies nausea, vomiting or diaphoresis.  Does have a history of acid reflux and takes Pepcid on a regular daily basis.  Denies any significant alcohol use.  No nicotine use.  Prior gastric bypass and cholecystectomy.  No prior history of DVT or PE.  Family history of coronary artery disease at a young age in her brother.  Prior stress testing and questionably a heart cath many years ago that she believes were unremarkable.  Patient took a full dose of aspirin prior to arrival at the recommendation of her primary care provider.  No prior stress testing or cardiac catheterization on my review of her chart.     Physical Exam   Triage Vital Signs: ED Triage Vitals [03/12/24 0850]  Encounter Vitals Group     BP (!) 144/97     Girls Systolic BP Percentile      Girls Diastolic BP Percentile      Boys Systolic BP Percentile      Boys Diastolic BP Percentile      Pulse Rate (!) 59     Resp 17     Temp 98.4 F (36.9 C)     Temp Source Oral     SpO2 100 %     Weight 171 lb 15.3 oz (78 kg)     Height 5' 5.5 (1.664 m)     Head Circumference      Peak Flow      Pain Score  3     Pain Loc      Pain Education      Exclude from Growth Chart     Most recent vital signs: Vitals:   03/12/24 0850  BP: (!) 144/97  Pulse: (!) 59  Resp: 17  Temp: 98.4 F (36.9 C)  SpO2: 100%    Physical Exam Constitutional:      Appearance: She is well-developed.  HENT:     Head: Atraumatic.  Eyes:     Conjunctiva/sclera: Conjunctivae normal.  Cardiovascular:     Rate and Rhythm: Regular rhythm.     Pulses:          Radial pulses are 2+ on the right side and 2+ on the left side.       Dorsalis pedis pulses are 2+ on the right side and 2+ on the left side.     Heart sounds: Normal heart sounds. No murmur heard. Pulmonary:     Effort: No respiratory distress.  Abdominal:     General: There is no distension.     Palpations: Abdomen is soft.     Tenderness: There is no  abdominal tenderness.  Musculoskeletal:        General: Normal range of motion.     Cervical back: Normal range of motion.     Right lower leg: No edema.     Left lower leg: No edema.  Skin:    General: Skin is warm.     Capillary Refill: Capillary refill takes less than 2 seconds.  Neurological:     General: No focal deficit present.     Mental Status: She is alert. Mental status is at baseline.     IMPRESSION / MDM / ASSESSMENT AND PLAN / ED COURSE  I reviewed the triage vital signs and the nursing notes.  Differential diagnosis including gastritis/PUD, ACS, pneumonia, anemia, pulmonary embolism.  Low suspicion for dissection, no tearing chest pain.   EKG  I, Clotilda Punter, the attending physician, personally viewed and interpreted this ECG.   Rate: Normal  Rhythm: Normal sinus  Axis: Normal  Intervals: Normal  ST&T Change: None  No tachycardic or bradycardic dysrhythmias while on cardiac telemetry.  RADIOLOGY I independently reviewed imaging, my interpretation of imaging: Chest x-ray no signs of pneumonia, no widened mediastinum, no pulmonary edema  LABS (all labs ordered are  listed, but only abnormal results are displayed) Labs interpreted as -    Labs Reviewed  BASIC METABOLIC PANEL WITH GFR - Abnormal; Notable for the following components:      Result Value   Potassium 3.4 (*)    All other components within normal limits  CBC  D-DIMER, QUANTITATIVE  HEPATIC FUNCTION PANEL  TROPONIN I (HIGH SENSITIVITY)  TROPONIN I (HIGH SENSITIVITY)     MDM  Lab work with no significant leukocytosis.  No significant anemia.  Creatinine appears to be at her baseline.  No significant electrolyte abnormality.  Initial troponin is negative.  Given Toradol  and Pepcid  On reevaluation initially had some improvement of her chest pain but then it returned.  Complaining of pain to her right side of her chest.  Serial troponins are negative.  Patient does have moderate risk heart score 4.  Low risk Wells criteria, D-dimer is negative, have a low suspicion for pulmonary embolism.  Repeat abdominal exam is overall nontender to palpation.  Serial troponins are negative.  On reevaluation patient had return of her chest pain.  Continues to plain of right sided chest pain and pressure sensation.  Moderate risk heart score.  Discussed admission for further evaluation for further workup of ACS versus close outpatient follow-up with cardiology.  Ultimately the patient stated that she would want to be admitted to the hospital for further cardiac workup given her ongoing chest pain and multiple risk factors.  Will consult hospitalist.     PROCEDURES:  Critical Care performed: No  Procedures  Patient's presentation is most consistent with acute presentation with potential threat to life or bodily function.   MEDICATIONS ORDERED IN ED: Medications  nitroGLYCERIN (NITROSTAT) SL tablet 0.4 mg (has no administration in time range)  ketorolac  (TORADOL ) 30 MG/ML injection 30 mg (30 mg Intramuscular Given 03/12/24 0922)  famotidine (PEPCID) tablet 20 mg (20 mg Oral Given 03/12/24 9077)     FINAL CLINICAL IMPRESSION(S) / ED DIAGNOSES   Final diagnoses:  Chest pain, unspecified type     Rx / DC Orders   ED Discharge Orders     None        Note:  This document was prepared using Dragon voice recognition software and may include unintentional dictation errors.   Darcia Lampi,  Clotilda, MD 03/12/24 1232

## 2024-03-12 NOTE — Telephone Encounter (Signed)
 FYI Only or Action Required?: FYI only for provider.  Patient was last seen in primary care on 02/21/2024 by Gareth Mliss FALCON, FNP.  Called Nurse Triage reporting Chest Pain.  Symptoms began several days ago.  Interventions attempted: Rest, hydration, or home remedies.  Symptoms are: mild chest pain between breasts and worse on right side non-radiating and lasts hours (hx of cardiac stent, HTN and strong family history of cardiac disease) unchanged.  Triage Disposition: Go to ED Now (overriding Call EMS 911 Now)  Patient/caregiver understands and will follow disposition?: Yes              Copied from CRM [id number]. Topic: Clinical - Red Word Triage >> Mar 12, 2024  7:48 AM [place] MATSU wrote: Red Word that prompted transfer to Nurse Triage: chest pain Reason for Disposition  [1] Chest pain lasts > 5 minutes AND [2] age > 38  Answer Assessment - Initial Assessment Questions Patient declined calling rescue. She is agreeable to take aspirin and have her husband take her to ED. Patient asking if this sounds like a heart attack or if they can build up or be symptomatic for a few days. RN advised patient unable to diagnose but due to cardiac risk factors and symptoms it is best to go to ED to have assessment and testing done to rule out cardiac event.  1. LOCATION: Where does it hurt?       Between breasts, more towards the right side. Goes under the breast and up to clavicle.  2. RADIATION: Does the pain go anywhere else? (e.g., into neck, jaw, arms, back)     No. She states she had neck pain on Sunday.  3. ONSET: When did the chest pain begin? (Minutes, hours or days)      Sunday.  4. PATTERN: Does the pain come and go, or has it been constant since it started?  Does it get worse with exertion?      Sunday it was a majority of the day, improved after dinner. She states the chest pain returned yesterday afternoon. She felt faint while walking her dogs yesterday.  5.  DURATION: How long does it last (e.g., seconds, minutes, hours)     Few hours.  6. SEVERITY: How bad is the pain?  (e.g., Scale 1-10; mild, moderate, or severe)     3-4/10, uncomfortable.  7. CARDIAC RISK FACTORS: Do you have any history of heart problems or risk factors for heart disease? (e.g., angina, prior heart attack; diabetes, high blood pressure, high cholesterol, smoker, or strong family history of heart disease)     Strong family history of heart disease, HTN, cardiac stent.  8. PULMONARY RISK FACTORS: Do you have any history of lung disease?  (e.g., blood clots in lung, asthma, emphysema, birth control pills)     No.  9. CAUSE: What do you think is causing the chest pain?     Her husband told her he thinks this is related to her taking Tramadol . She states she has a history of HTN and strong family history of heart disease. She is concerned about heart attack.  10. OTHER SYMPTOMS: Do you have any other symptoms? (e.g., dizziness, nausea, vomiting, sweating, fever, difficulty breathing, cough)       Lightheaded/faint, fatigue, she states she feels a little SOB while speaking on the phone. She states she vomited on Sunday but attributes that to her gastric bypass. Hot flashes during the day but states not new or changed. Denies nausea.  11. PREGNANCY: Is there any chance you are pregnant? When was your last menstrual period?       N/A.  Protocols used: Chest Pain-A-AH

## 2024-03-12 NOTE — ED Triage Notes (Signed)
 Patient to ED via POV for right sided CP. States ongoing since Sunday. Denies cardiac hx. States she has also been feeling tired and faint.

## 2024-03-12 NOTE — ED Notes (Signed)
 Report given to [facility]

## 2024-03-13 ENCOUNTER — Observation Stay

## 2024-03-13 DIAGNOSIS — R079 Chest pain, unspecified: Secondary | ICD-10-CM

## 2024-03-13 DIAGNOSIS — K219 Gastro-esophageal reflux disease without esophagitis: Secondary | ICD-10-CM | POA: Diagnosis not present

## 2024-03-13 DIAGNOSIS — K449 Diaphragmatic hernia without obstruction or gangrene: Secondary | ICD-10-CM | POA: Diagnosis not present

## 2024-03-13 DIAGNOSIS — I10 Essential (primary) hypertension: Secondary | ICD-10-CM | POA: Diagnosis not present

## 2024-03-13 DIAGNOSIS — R0789 Other chest pain: Secondary | ICD-10-CM | POA: Diagnosis not present

## 2024-03-13 DIAGNOSIS — N1330 Unspecified hydronephrosis: Secondary | ICD-10-CM | POA: Diagnosis not present

## 2024-03-13 LAB — HIV ANTIBODY (ROUTINE TESTING W REFLEX): HIV Screen 4th Generation wRfx: NONREACTIVE

## 2024-03-13 MED ORDER — OMEPRAZOLE 40 MG PO CPDR: 40.0000 mg | DELAYED_RELEASE_CAPSULE | Freq: Two times a day (BID) | ORAL | Status: AC

## 2024-03-13 MED ORDER — PANTOPRAZOLE SODIUM 40 MG PO TBEC: 40.0000 mg | DELAYED_RELEASE_TABLET | Freq: Every day | ORAL | Status: DC

## 2024-03-13 MED ORDER — LISINOPRIL 10 MG PO TABS
5.0000 mg | ORAL_TABLET | Freq: Every day | ORAL | Status: DC
Start: 2024-03-13 — End: 2024-03-13
  Administered 2024-03-13: 5 mg via ORAL
  Filled 2024-03-13: qty 1

## 2024-03-13 MED ORDER — VITAMIN D (ERGOCALCIFEROL) 1.25 MG (50000 UNIT) PO CAPS
50000.0000 [IU] | ORAL_CAPSULE | ORAL | Status: DC
  Filled 2024-03-13: qty 1

## 2024-03-13 MED ORDER — IOHEXOL 300 MG/ML  SOLN
100.0000 mL | Freq: Once | INTRAMUSCULAR | Status: AC | PRN
  Administered 2024-03-13: 100 mL via INTRAVENOUS

## 2024-03-13 MED ORDER — PANTOPRAZOLE SODIUM 40 MG PO TBEC
40.0000 mg | DELAYED_RELEASE_TABLET | Freq: Two times a day (BID) | ORAL | Status: DC
  Administered 2024-03-13: 40 mg via ORAL
  Filled 2024-03-13: qty 1

## 2024-03-13 NOTE — Discharge Instructions (Addendum)
 It is unclear what is causing the chest discomfort you had.   Fortunately it is not related to you heart. It could be related to the muscles in that area or from something going on with your stomach.   Please increase your prilosec to 40mg  twice a day for two weeks. During this period please schedule an appointment with your GI doctors as you are having episodes of vomiting after eating.   Please eat leafy green vegetables and other foods high in fiber as well as ensure you have adequate water intake so your bowels will move.   Call MD for: persistant nausea and vomiting;Call MD for: severe uncontrolled pain;Call MD for: temperature >100.4

## 2024-03-13 NOTE — H&P (Signed)
 History and Physical    [name] FMW:[id number] DOB: 09/02/1970 DOA: 03/12/2024  DOS: the patient was seen and examined on 03/12/2024  PCP: Gareth Mliss FALCON, FNP   Patient coming from: Home  I have personally briefly reviewed patient's old medical records in [facility] and CareEverywhere  HPI:   [name] is a 53 y.o. year old female with Pt is a 53 year old female with past medical hx of HTN, HLD presenting to the ED for chest pain/pressure since Sunday.   Chest pressure 4/10. Since Sunday, stayed full day, waxing and waning. Nothing makes it better and nothing makes it better. Was over 300 lbs and got bypass few years ago. States had heart disease and got a cath about 15 years ago. Brother died in his 23 from MI and Father in his 70s who had a CABG.   EDP gave pain meds and consulted TRH for admission given chest pain with equivocal HEART score.   Family Hx:  Brother MI in 38s Father MI in 88s and had CABG HLD: Brother and Father   Social Lives with husband, 3 daughter and 3 step sons Smoke never Alcohol use occasional Illicit marijuana   Past Medical History:  Diagnosis Date   Anxiety    Bowel obstruction (HCC)    Depression    Hypertension     Past Surgical History:  Procedure Laterality Date   ABDOMINAL HYSTERECTOMY     CHOLECYSTECTOMY     COLONOSCOPY WITH PROPOFOL  N/A 07/07/2022   Procedure: COLONOSCOPY WITH PROPOFOL ;  Surgeon: Therisa Bi, MD;  Location[facility] ENDOSCOPY;  Service: Gastroenterology;  Laterality: N/A;   ESOPHAGOGASTRODUODENOSCOPY N/A 07/07/2022   Procedure: ESOPHAGOGASTRODUODENOSCOPY (EGD);  Surgeon: Therisa Bi, MD;  Location: [facility] ENDOSCOPY;  Service: Gastroenterology;  Laterality: N/A;   GASTRIC BYPASS       No Known Allergies  Family History  Problem Relation Age of Onset   Breast cancer Mother 72   Cancer Mother    Heart disease Father    Alcohol abuse Father    Diabetes Paternal Grandmother    Heart disease Brother     Anxiety disorder Brother     Prior to Admission medications   Medication Sig Start Date End Date Taking? Authorizing Provider  ergocalciferol  (VITAMIN D2) 1.25 MG (50000 UT) capsule Take 1 capsule (50,000 Units total) by mouth once a week. 08/10/23  Yes Dasie Tinnie MATSU, NP  lisinopril  (ZESTRIL ) 5 MG tablet Take 1 tablet (5 mg total) by mouth daily. 01/18/24  Yes Gareth Mliss FALCON, FNP  omeprazole  (PRILOSEC) 40 MG capsule Take 1 capsule (40 mg total) by mouth daily. 01/18/24  Yes Gareth Mliss FALCON, FNP  tiZANidine  (ZANAFLEX ) 4 MG tablet Take 1 tablet (4 mg total) by mouth every 6 (six) hours as needed for muscle spasms. 08/09/23  Yes Pender, Julie F, FNP  traMADol  (ULTRAM ) 50 MG tablet Take 1-3 tablets by mouth as needed for moderate pain (pain score 4-6).   Yes [provider]  hydrOXYzine  (VISTARIL ) 25 MG capsule TAKE 1 CAPSULE (25 MG TOTAL) BY MOUTH EVERY 8 (EIGHT) HOURS AS NEEDED FOR ANXIETY. Patient not taking: No sig reported 01/18/24   Gareth Mliss FALCON, FNP      reports that she has never smoked. She has never used smokeless tobacco. She reports current alcohol use. She reports current drug use. Drug: Marijuana. Lives with husband.   Tobacco- Denies use. EtOH-  Denies use.  Illicit drug use- denies use.  IADLs/ADLs- can person independently  at baseline    Physical Exam: Vitals:   03/12/24 1905 03/12/24 1930 03/12/24 2200 03/13/24 0218  BP:  127/78 120/76 123/74  Pulse:  (!) 58 (!) 50 62  Resp:  17 18 20   Temp: 98.8 F (37.1 C)     TempSrc: Oral     SpO2:  100% 100% 100%  Weight:      Height:         Gen: NAD HENT: NCAT CV: RRR, good pulses Resp: CTAB Abd: No TTP MSK: No asymmetry Neuro: alert and oriented x4 Psych: anxious mood   Labs on Admission: I have personally reviewed following labs and imaging studies  CBC: Recent Labs  Lab 03/12/24 0852  WBC 5.4  HGB 14.0  HCT 41.4  MCV 88.5  PLT 257   Basic Metabolic Panel: Recent Labs  Lab  03/12/24 0852  NA 143  K 3.4*  CL 105  CO2 25  GLUCOSE 99  BUN 9  CREATININE 0.74  CALCIUM 9.0  MG 2.3   GFR: Estimated Creatinine Clearance: 83.9 mL/min (by C-G formula based on SCr of 0.74 mg/dL). Liver Function Tests: Recent Labs  Lab 03/12/24 0906  AST 30  ALT 31  ALKPHOS 100  BILITOT 1.0  PROT 6.9  ALBUMIN 4.2   No results for input(s): LIPASE, AMYLASE in the last 168 hours. No results for input(s): AMMONIA in the last 168 hours. Coagulation Profile: No results for input(s): INR, PROTIME in the last 168 hours. Cardiac Enzymes: Recent Labs  Lab 03/12/24 0852 03/12/24 1100  TROPONINIHS 5 4   BNP (last 3 results) No results for input(s): BNP in the last 8760 hours. HbA1C: Recent Labs    03/12/24 0852  HGBA1C 5.1   CBG: No results for input(s): GLUCAP in the last 168 hours. Lipid Profile: Recent Labs    03/12/24 0852  CHOL 170  HDL 74  LDLCALC 84  TRIG 60  CHOLHDL 2.3   Thyroid  Function Tests: No results for input(s): TSH, T4TOTAL, FREET4, T3FREE, THYROIDAB in the last 72 hours. Anemia Panel: No results for input(s): VITAMINB12, FOLATE, FERRITIN, TIBC, IRON , RETICCTPCT in the last 72 hours. Urine analysis:    Component Value Date/Time   COLORURINE YELLOW (A) 12/24/2022 1609   APPEARANCEUR CLEAR (A) 12/24/2022 1609   APPEARANCEUR Clear 06/06/2022 1454   LABSPEC 1.014 12/24/2022 1609   PHURINE 6.0 12/24/2022 1609   GLUCOSEU NEGATIVE 12/24/2022 1609   HGBUR NEGATIVE 12/24/2022 1609   BILIRUBINUR NEGATIVE 12/24/2022 1609   BILIRUBINUR negative 11/11/2022 1506   BILIRUBINUR Negative 06/06/2022 1454   KETONESUR NEGATIVE 12/24/2022 1609   PROTEINUR NEGATIVE 12/24/2022 1609   UROBILINOGEN 0.2 11/11/2022 1506   NITRITE NEGATIVE 12/24/2022 1609   LEUKOCYTESUR NEGATIVE 12/24/2022 1609    Radiological Exams on Admission: I have personally reviewed images DG Chest 2 View Result Date: 03/12/2024 CLINICAL DATA:   Chest pain. EXAM: CHEST - 2 VIEW COMPARISON:  Radiographs and CT 12/18/2022.  Radiographs 03/02/2021. FINDINGS: The heart size and mediastinal contours are normal. The lungs are clear. There is no pleural effusion or pneumothorax. No acute osseous findings are identified. Mild degenerative changes in the spine. Postsurgical changes in the upper abdomen consistent with prior gastric bypass and cholecystectomy. IMPRESSION: No active cardiopulmonary process. Electronically Signed   By: Elsie Perone M.D.   On: 03/12/2024 09:46    EKG: My personal interpretation of EKG shows: sinus rhythm without any abnormalities.     Assessment/Plan Principal Problem:   Atypical chest pain  Pt with right sided chest pressure that has been constant since Sunday. Work up negative for acute ischemia. Reassured by duration of pain. Investigated MSK cause but pt states she has not over exerted. Has significant hx and family hx that definitely warrants outpatient work up for risk at baseline. Will monitor on tele, and get echo. Defer further testing based on  cardiology recommendations as pt insisted on being seen by cardiologist.   HTN: continue home med  VTE prophylaxis:  Lovenox  Diet: Regular Code Status:  Full Code Telemetry:  Admission status: Observation, Telemetry bed Patient is from: Home  Anticipated d/c is to: Home  Anticipated d/c is in: 1 days   Family Communication: Updated at bedside   Consults called: Cardiology messaged    Severity of Illness: The appropriate patient status for this patient is OBSERVATION. Observation status is judged to be reasonable and necessary in order to provide the required intensity of service to ensure the patient's safety. The patient's presenting symptoms, physical exam findings, and initial radiographic and laboratory data in the context of their medical condition is felt to place them at decreased risk for further clinical deterioration. Furthermore, it is  anticipated that the patient will be medically stable for discharge from the hospital within 2 midnights of admission.    Morene Bathe, MD Jolynn DEL. [facility]

## 2024-03-13 NOTE — Consult Note (Signed)
 Cardiology Consultation   Patient ID: [name] MRN: [id number]; DOB: June 08, 1970  Admit date: 03/12/2024 Date of Consult: 03/13/2024  PCP:  Gareth Mliss FALCON, FNP   [facility] Providers Cardiologist:  None        Patient Profile: [name] is a 53 y.o. female with a hx of hypertension, cholcystitis s/p cholecystectomy, gastric bypass who is being seen 03/13/2024 for the evaluation of chest pain at the request of Fernand Prost, MD.  History of Present Illness: Ms. Scahill is a 53 y.o. female with a hx of hypertension, cholcystitis s/p cholecystectomy, gastric bypass. She was over 300 lbs prior to gastric bypass surgery 5 years ago and reports having heart disease requiring a cath 15 years ago. Denies getting any stents but does not know further details about the cath. She has significant family history of a brother who had a MI in their 8s and father who had MI in 13s leading to CABG. No tobacco or alcohol use, occasional marijuana use. She regularly hikes and bikes long distances.  Recently completed a 50K bike race.  Patient reports on 10/10 she had high stress regarding her pet horse and noticed her heart rate was elevated (160s) while hauling a large water hose to fill up horses's water bucket. Similar episode on 10/11 of noted elevated heart rate while stressed and doing tasks involving pet horse. Pt does not recall having pain during these episodes. Right sided chest pain started in AM of 10/12, located in right chest wall starting below the right clavicle and stopping just under right breast. Pain waxes and wanes, is described as starting as a tightness but has changed to a soreness, is exacerbated by deep inspiration and walking her dogs but not with any other activity such as hiking/biking, and is not positional. She has not had any accompanying symptoms besides shortness of breath on 10/14, shortly before presenting to ED. She denies dizziness, nausea, vomiting,  weight gain, edema, early satiety.   In the ED, BP 144/97, HR 59 bpm with otherwise normal vital signs.  Pertinent labs include potassium 3.5 with otherwise normal CBC and BMP.  D-dimer negative.  Troponin negative x 2.  EKG shows sinus rhythm without acute ischemic changes.  Chest x-ray negative for active cardiopulmonary process.  CT abdomen pelvis negative for acute process.  She was given Pepcid and Tylenol  in the ED. Cardiology was asked to consult at the request of the patient for further evaluation of precordial pain. At time of cardiology consult, patient reports ongoing intermittent chest discomfort.  Pain is reproducible with palpation.   Past Medical History:  Diagnosis Date   Anxiety    Bowel obstruction (HCC)    Depression    Hypertension     Past Surgical History:  Procedure Laterality Date   ABDOMINAL HYSTERECTOMY     CHOLECYSTECTOMY     COLONOSCOPY WITH PROPOFOL  N/A 07/07/2022   Procedure: COLONOSCOPY WITH PROPOFOL ;  Surgeon: Therisa Bi, MD;  Location: [facility] ENDOSCOPY;  Service: Gastroenterology;  Laterality: N/A;   ESOPHAGOGASTRODUODENOSCOPY N/A 07/07/2022   Procedure: ESOPHAGOGASTRODUODENOSCOPY (EGD);  Surgeon: Therisa Bi, MD;  Location[facility] ENDOSCOPY;  Service: Gastroenterology;  Laterality: N/A;   GASTRIC BYPASS       Home Medications:  Prior to Admission medications   Medication Sig Start Date End Date Taking? Authorizing Provider  ergocalciferol  (VITAMIN D2) 1.25 MG (50000 UT) capsule Take 1 capsule (50,000 Units total) by mouth once a week. 08/10/23  Yes Dasie Tinnie MATSU, NP  lisinopril  (ZESTRIL )  5 MG tablet Take 1 tablet (5 mg total) by mouth daily. 01/18/24  Yes Gareth Mliss FALCON, FNP  omeprazole  (PRILOSEC) 40 MG capsule Take 1 capsule (40 mg total) by mouth daily. 01/18/24  Yes Gareth Mliss FALCON, FNP  tiZANidine  (ZANAFLEX ) 4 MG tablet Take 1 tablet (4 mg total) by mouth every 6 (six) hours as needed for muscle spasms. 08/09/23  Yes Pender, Julie F, FNP  traMADol  (ULTRAM )  50 MG tablet Take 1-3 tablets by mouth as needed for moderate pain (pain score 4-6).   Yes [provider]  hydrOXYzine  (VISTARIL ) 25 MG capsule TAKE 1 CAPSULE (25 MG TOTAL) BY MOUTH EVERY 8 (EIGHT) HOURS AS NEEDED FOR ANXIETY. Patient not taking: No sig reported 01/18/24   Gareth Mliss FALCON, FNP    Scheduled Meds:  enoxaparin (LOVENOX) injection  40 mg Subcutaneous Q24H   lidocaine   1 patch Transdermal Q24H   lisinopril   5 mg Oral Daily   pantoprazole  40 mg Oral BID   Vitamin D  (Ergocalciferol )  50,000 Units Oral Weekly   Continuous Infusions:  PRN Meds: acetaminophen  **OR** acetaminophen , senna-docusate  Allergies:   No Known Allergies  Social History:   Social History   Socioeconomic History   Marital status: Married    Spouse name: Hassel   Number of children: 3   Years of education: Not on file   Highest education level: Some college, no degree  Occupational History   Not on file  Tobacco Use   Smoking status: Never   Smokeless tobacco: Never  Vaping Use   Vaping status: Never Used  Substance and Sexual Activity   Alcohol use: Yes    Comment: occassional/social   Drug use: Yes    Types: Marijuana   Sexual activity: Not on file  Other Topics Concern   Not on file  Social History Narrative   Moved from [place]  to [place] then went to [place]  and back in [place] for 7 years   Social Drivers of Corporate investment banker Strain: Low Risk  (02/21/2024)   Overall Financial Resource Strain (CARDIA)    Difficulty of Paying Living Expenses: Not hard at all  Food Insecurity: No Food Insecurity (03/13/2024)   Hunger Vital Sign    Worried About Running Out of Food in the Last Year: Never true    Ran Out of Food in the Last Year: Never true  Transportation Needs: No Transportation Needs (03/13/2024)   PRAPARE - Administrator, Civil Service (Medical): No    Lack of Transportation (Non-Medical): No  Physical Activity: Insufficiently Active  (02/21/2024)   Exercise Vital Sign    Days of Exercise per Week: 4 days    Minutes of Exercise per Session: 30 min  Stress: No Stress Concern Present (02/21/2024)   [employer] of Occupational Health - Occupational Stress Questionnaire    Feeling of Stress: Not at all  Social Connections: Moderately Integrated (05/25/2022)   Social Connection and Isolation Panel    Frequency of Communication with Friends and Family: Twice a week    Frequency of Social Gatherings with Friends and Family: Once a week    Attends Religious Services: 1 to 4 times per year    Active Member of [employer] or Organizations: No    Attends Banker Meetings: Never    Marital Status: Married  Catering manager Violence: Not At Risk (03/13/2024)   Humiliation, Afraid, Rape, and Kick questionnaire    Fear of Current or Ex-Partner:  No    Emotionally Abused: No    Physically Abused: No    Sexually Abused: No    Family History:   Family History  Problem Relation Age of Onset   Breast cancer Mother 34   Cancer Mother    Heart disease Father    Alcohol abuse Father    Diabetes Paternal Grandmother    Heart disease Brother    Anxiety disorder Brother      ROS:  Please see the history of present illness.   All other ROS reviewed and negative.     Physical Exam/Data: Vitals:   03/13/24 0700 03/13/24 0800 03/13/24 1150 03/13/24 1152  BP: 132/68  (!) 149/99   Pulse: (!) 46 65 67   Resp: 15 12 19    Temp:  98.2 F (36.8 C)  98.1 F (36.7 C)  TempSrc:  Oral  Oral  SpO2: 99% 100% 100%   Weight:      Height:       No intake or output data in the 24 hours ending 03/13/24 1248    03/12/2024    8:50 AM 02/21/2024    8:21 AM 08/09/2023   10:13 AM  Last 3 Weights  Weight (lbs) 171 lb 15.3 oz 173 lb 9.6 oz 171 lb 14.4 oz  Weight (kg) 78 kg 78.744 kg 77.973 kg     Body mass index is 28.18 kg/m.  General:  Well nourished, well developed, in no acute distress HEENT: normal Neck: no JVD Vascular:  No carotid bruits; Distal pulses 2+ bilaterally Cardiac:  normal S1, S2; RRR; no murmur  Lungs:  clear to auscultation bilaterally, no wheezing, rhonchi or rales  Abd: soft, nontender, no hepatomegaly  Ext: no edema Musculoskeletal: Tenderness to palpation of right chest wall that is worst at upper lateral right chest wall and inferior sternal region.  Skin: warm and dry  Psych:  Normal affect   EKG:  The EKG was personally reviewed and demonstrates:  Sinus bradycardia, rate 55 bpm Telemetry:  Telemetry was personally reviewed and demonstrates:  Pt not on telemetry   Laboratory Data: High Sensitivity Troponin:   Recent Labs  Lab 03/12/24 0852 03/12/24 1100  TROPONINIHS 5 4     Chemistry Recent Labs  Lab 03/12/24 0852  NA 143  K 3.4*  CL 105  CO2 25  GLUCOSE 99  BUN 9  CREATININE 0.74  CALCIUM 9.0  MG 2.3  GFRNONAA >60  ANIONGAP 13    Recent Labs  Lab 03/12/24 0906  PROT 6.9  ALBUMIN 4.2  AST 30  ALT 31  ALKPHOS 100  BILITOT 1.0   Lipids  Recent Labs  Lab 03/12/24 0852  CHOL 170  TRIG 60  HDL 74  LDLCALC 84  CHOLHDL 2.3    Hematology Recent Labs  Lab 03/12/24 0852  WBC 5.4  RBC 4.68  HGB 14.0  HCT 41.4  MCV 88.5  MCH 29.9  MCHC 33.8  RDW 12.4  PLT 257   Thyroid  No results for input(s): TSH, FREET4 in the last 168 hours.  BNPNo results for input(s): BNP, PROBNP in the last 168 hours.  DDimer  Recent Labs  Lab 03/12/24 9093  DDIMER <0.27    Radiology/Studies:  CT ABDOMEN PELVIS W CONTRAST Result Date: 03/13/2024 IMPRESSION: 1. Mild right-sided hydronephrosis without evidence of obstructing renal calculi. 2. Evidence of prior cholecystectomy, appendectomy and gastric surgery. 3. Large stool burden without evidence of bowel obstruction. 4. 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 5. Aortic atherosclerosis. Electronically Signed   By: Suzen Dials M.D.   On:  03/13/2024 11:43   DG Chest 2 View Result Date: 03/12/2024 IMPRESSION: No active cardiopulmonary process. Electronically Signed   By: Elsie Perone M.D.   On: 03/12/2024 09:46   Assessment and Plan:  Precordial pain - Right anterior chest wall pain that has waxed and waned over 3 days, is tender to palpation and worse with deep inspiration. Not exertional.  - Troponin negative x2 - EKG without acute ischemic changes - Patient does have positive family history with young brother having MI.  However, she is overall low risk with well-controlled cholesterol (not on medication), no prior tobacco use, and A1c of 5.1. - CT Abdomen w contrast was reviewed with pt by MD and without evidence of coronary artery calcification. Minimal atherosclerosis was noted in the aorta. - Suspect pain is musculoskeletal in nature, given recent strenuous upper extremity activity and reproducible pain with palpation.  Reassurance was provided. - Discussed further evaluation with patient including echocardiogram.  Patient and care team agree it is not indicated at this time.  Hypertension - Continue PTA antihypertensives   For questions or updates, please contact [facility] Please consult [email or web address] for contact info under      Signed, Lesley LITTIE Maffucci, PA-C  03/13/2024 12:48 PM

## 2024-03-13 NOTE — Progress Notes (Incomplete)
  Progress Note   Patient: [name] FMW:[id number] DOB: Dec 22, 1970 DOA: 03/12/2024     0 DOS: the patient was seen and examined on 03/13/2024   Brief hospital course: No notes on file  Assessment and Plan: No notes have been filed under this hospital service. Service: Hospitalist     {Tip this will not be part of the note when signed Body mass index is 28.18 kg/m. 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 Went away.  Saturday mid day. '  Sunday all day no work. Went away at bed.   Monday had pain during day at work. Feeling faint after walking dogs. Feeling dizzy and lightheaded. Eating and drinking normally.   Tuesday when went to work.   Had 50k bike race about a month ago with no symptoms.   Cardiac catheterization 15years ago. Was told something was scraped out.   Prilosec has gastric ulcer  Hiatal hernia. Does not feel like this is same pain.  Gastric bypass5 years ago. 3 years ago in [place] . Roux en y.  [facility] .  Sometimes when eats too fast will get regurgitation.   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 Had a gb removed as well as appendiz. '  Tramadol  for pian   Physical Exam: Vitals:   03/13/24 0300 03/13/24 0443 03/13/24 0500 03/13/24 0600  BP: 121/81  127/82 (!) 137/93  Pulse: (!) 51  (!) 52 (!) 55  Resp: 15  18 16   Temp:  98.1 F (36.7 C)    TempSrc:  Oral    SpO2: 100%  100% 100%  Weight:      Height:       Physical Exam  Constitutional: In no distress.  Chest: R chest discomfort worsened with palpation.  Cardiovascular: Normal rate, regular rhythm. No lower extremity edema  Pulmonary: Non labored breathing on room air, no wheezing or rales.   Abdominal: Soft. Non distended and non tender Musculoskeletal: Normal range of motion.     Neurological: Alert and oriented to person, place, and time. Non focal  Skin: Skin is  warm and dry.   Data Reviewed: {Tip this will not be part of the note when signed- Document your independent interpretation of telemetry tracing, EKG, lab, Radiology test or any other diagnostic tests. Add any new diagnostic test ordered today. (Optional):26781} {Results:26384}  Family Communication: ***  Disposition: Status is: Observation {Observation:23811}  Planned Discharge Destination: {DISCHARGE DESTINATION_TRH:27031} {Tip this will not be part of the note when signed  DVT Prophylaxis  ., Enoxaparin (lovenox) injection 40 mg  (Optional):26781}   Time spent: *** minutes  Author: Alban Pepper, MD 03/13/2024 8:14 AM  For on call review [email or web address].

## 2024-03-15 NOTE — Discharge Summary (Signed)
 Physician Discharge Summary   Patient: [name] MRN: [id number] DOB: [birth year]  Admit date:     03/12/2024  Discharge date: 03/13/2024  Discharge Physician: Alban Pepper   PCP: Gareth Mliss FALCON, FNP   Recommendations at discharge:    Repeat BMP to ensure potassium level normalized  F/u with GI regarding regurgitation.   Discharge Diagnoses: Principal Problem:   Chest pain Active Problems:   Essential hypertension  Resolved Problems:   * No resolved hospital problems. [facility] Course: Per H&P HPI and cardiology   [name] is a 53 y.o. year old female with past medical hx of HTN, HLD, roux-en-y gastric bypass presenting to the ED for chest pain/pressure since Sunday.    Reports she is active at baseline, recently completed 50K bike race without any chest pain concerning for angina   Reports March 08, 2024 was visiting her horse, was frustrated horse was placed in the wrong pasture, horse had minimal water in its bucket and had to drag a long heavy hose 150/200 feet, upset at the time, noted elevated heart rate 160 bpm.  At the time had no significant chest pain Subsequent to that time she has had waxing waning chest pain symptoms, worse on palpation on the right side from her clavicle area radiating to beneath the right breast She has been back to the farm to see her horse, has not been riding as she has just not felt well   Given symptoms of chest pain persisted she presented to the emergency room also with symptoms of feeling tired, faint In the ER blood pressure 140s over 90s heart rate 60 EKG nonacute Cardiac enzymes negative Echo pending but has not been completed yet   Family Hx:  Brother MI in 73s Father MI in 71s and had CABG HLD: Brother and Father    roux-en y gastric bypass 2020 [facility] [employer] c/b anastomatic leak Hiatal hernia  H/o Gastric ulcer Small bowel stricture  Social Lives with husband, 3 daughters and 3  step sons Smoke never Alcohol use occasional marijuana  Assessment and Plan: Atypical chest pain Patient has good exercise tolerance at baseline.  She had a normal EKG and negative troponins.  Making cardiac etiology of her chest pain less likely.  She was evaluated by cardiology who felt her pain was related to musculoskeletal pain in the setting of recent heavy exertion.  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  She also reports that she occasionally has episodes of regurgitation.  She had a recent EGD 06/2022 which showed normal esophagus, normal examined jejunum, Roux-en-Y anatomy with healthy appearing mucosa at gastrojejunal anastomosis.  He did have a single gastric polyp which was removed.  Given this history a CT of the abdomen pelvis was obtained which showed no evidence of anastomotic leak or stricture.  Patient will take her home PPI twice daily for 2 weeks and will schedule an appointment with GI at this time.  She was also given instructions for managing her musculoskeletal pain.  HTN Continue home lisinopril  with further titration outpatient.       Consultants: Cardiology  Procedures performed: None  Disposition: Home Diet recommendation:  Regular diet DISCHARGE MEDICATION: Allergies as of 03/13/2024   No Known Allergies      Medication List     TAKE these medications    ergocalciferol  1.25 MG (50000 UT) capsule Commonly known as: VITAMIN D2  Take 1 capsule (50,000 Units total) by mouth once a week.   hydrOXYzine  25 MG capsule Commonly known as: VISTARIL  TAKE 1 CAPSULE (25 MG TOTAL) BY MOUTH EVERY 8 (EIGHT) HOURS AS NEEDED FOR ANXIETY.   lisinopril  5 MG tablet Commonly known as: ZESTRIL  Take 1 tablet (5 mg total) by mouth daily.   omeprazole  40 MG capsule Commonly known as: PRILOSEC Take 1 capsule (40 mg total) by mouth 2 (two) times daily. For two weeks then once  daily. What changed:  when to take this additional instructions   tiZANidine  4 MG tablet Commonly known as: ZANAFLEX  Take 1 tablet (4 mg total) by mouth every 6 (six) hours as needed for muscle spasms.   traMADol  50 MG tablet Commonly known as: ULTRAM  Take 1-3 tablets by mouth as needed for moderate pain (pain score 4-6).        Follow-up Information     Gareth Mliss FALCON, FNP. Schedule an appointment as soon as possible for a visit in 1 week(s).   Specialty: Nurse Practitioner Contact information: [address] [place] [address] [phone]         Therisa Bi, MD. Schedule an appointment as soon as possible for a visit in 1 week(s).   Specialty: Gastroenterology Why: for frequent episodes of regurgitation. Contact information: [address] [place] [address] [phone]                Discharge Exam: Filed Weights   03/12/24 0850  Weight: 78 kg   Physical Exam  Constitutional: In no distress.  Cardiovascular: Normal rate, regular rhythm. No lower extremity edema  Pulmonary: Non labored breathing on room air, no wheezing or rales.   Abdominal: Soft. Non distended and non tender Musculoskeletal: Normal range of motion.     Neurological: Alert and oriented to person, place, and time. Non focal  Skin: Skin is warm and dry.    Condition at discharge: good  The results of significant diagnostics from this hospitalization (including imaging, microbiology, ancillary and laboratory) are listed below for reference.   Imaging Studies: CT ABDOMEN PELVIS W CONTRAST Result Date: 03/13/2024 CLINICAL DATA:  Chest pain. EXAM: CT ABDOMEN AND PELVIS WITH CONTRAST TECHNIQUE: Multidetector CT imaging of the abdomen and pelvis was performed using the standard protocol following bolus administration of intravenous contrast. RADIATION DOSE REDUCTION: This exam was performed according to the departmental dose-optimization program which  includes automated exposure control, adjustment of the mA and/or kV according to patient size and/or use of iterative reconstruction technique. CONTRAST:  OMNIPAQUE  IOHEXOL  300 MG/ML  SOLN COMPARISON:  December 24, 2022 FINDINGS: Lower chest: No acute abnormality. Hepatobiliary: No focal liver abnormality is seen. Status post cholecystectomy. No biliary dilatation. Pancreas: Unremarkable. No pancreatic ductal dilatation or surrounding inflammatory changes. Spleen: Normal in size without focal abnormality. Adrenals/Urinary Tract: Adrenal glands are unremarkable. Kidneys are normal in size, without renal calculi or focal lesions. There is mild right-sided hydronephrosis without evidence of obstructing renal calculi. Bladder is unremarkable. Stomach/Bowel: There is a small hiatal hernia surgical sutures are seen throughout the gastric region. Surgically anastomosed bowel is also seen within the mid left abdomen. The appendix is surgically absent. A large amount of stool is seen throughout the colon. No evidence of bowel wall thickening, distention, or inflammatory changes. Vascular/Lymphatic: Aortic atherosclerosis. No enlarged abdominal or pelvic lymph nodes. Reproductive: Uterus and bilateral adnexa are unremarkable. Other: No abdominal wall hernia or abnormality. No abdominopelvic ascites. Musculoskeletal: There are bilateral pars defects  at the level of L5 with grade 2 anterolisthesis of the L5 vertebral body on S1. No acute osseous abnormalities are identified. IMPRESSION: 1. Mild right-sided hydronephrosis without evidence of obstructing renal calculi. 2. Evidence of prior cholecystectomy, appendectomy and gastric surgery. 3. Large stool burden without evidence of bowel obstruction. 4. 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 5. Aortic atherosclerosis. Electronically Signed   By: Suzen Dials M.D.   On: 03/13/2024 11:43   DG Chest 2  View Result Date: 03/12/2024 CLINICAL DATA:  Chest pain. EXAM: CHEST - 2 VIEW COMPARISON:  Radiographs and CT 12/18/2022.  Radiographs 03/02/2021. FINDINGS: The heart size and mediastinal contours are normal. The lungs are clear. There is no pleural effusion or pneumothorax. No acute osseous findings are identified. Mild degenerative changes in the spine. Postsurgical changes in the upper abdomen consistent with prior gastric bypass and cholecystectomy. IMPRESSION: No active cardiopulmonary process. Electronically Signed   By: Elsie Perone M.D.   On: 03/12/2024 09:46    Microbiology: Results for orders placed or performed in visit on 04/05/22  Novel Coronavirus, NAA ([facility])     Status: None   Collection Time: 04/05/22 12:00 AM   Specimen: Nasopharyngeal(NP) swabs in vial transport medium   Nasopharynge  Previous  Result Value Ref Range Status   SARS-CoV-2, NAA Not Detected Not Detected Final    Comment: This nucleic acid amplification test was developed and its performance characteristics determined by [employer]. Nucleic acid amplification tests include RT-PCR and TMA. This test has not been FDA cleared or approved. This test has been authorized by FDA under an Emergency Use Authorization (EUA). This test is only authorized for the duration of time the declaration that circumstances exist justifying the authorization of the emergency use of in vitro diagnostic tests for detection of SARS-CoV-2 virus and/or diagnosis of COVID-19 infection under section 564(b)(1) of the Act, 21 U.S.C. 639aaa-6(a) (1), unless the authorization is terminated or revoked sooner. When diagnostic testing is negative, the possibility of a false negative result should be considered in the context of a patient's recent exposures and the presence of clinical signs and symptoms consistent with COVID-19. An individual without symptoms of COVID-19 and who is not shedding SARS-CoV-2 virus wo uld expect to  have a negative (not detected) result in this assay.     Labs: CBC: Recent Labs  Lab 03/12/24 0852  WBC 5.4  HGB 14.0  HCT 41.4  MCV 88.5  PLT 257   Basic Metabolic Panel: Recent Labs  Lab 03/12/24 0852  NA 143  K 3.4*  CL 105  CO2 25  GLUCOSE 99  BUN 9  CREATININE 0.74  CALCIUM 9.0  MG 2.3   Liver Function Tests: Recent Labs  Lab 03/12/24 0906  AST 30  ALT 31  ALKPHOS 100  BILITOT 1.0  PROT 6.9  ALBUMIN 4.2   CBG: No results for input(s): GLUCAP in the last 168 hours.  Discharge time spent: greater than 30 minutes.  Signed: Alban Pepper, MD [facility] 03/15/2024

## 2024-03-24 ENCOUNTER — Other Ambulatory Visit: Payer: Self-pay | Admitting: Nurse Practitioner

## 2024-03-24 DIAGNOSIS — F419 Anxiety disorder, unspecified: Secondary | ICD-10-CM

## 2024-03-24 DIAGNOSIS — F5105 Insomnia due to other mental disorder: Secondary | ICD-10-CM

## 2024-03-25 ENCOUNTER — Ambulatory Visit: Admitting: Nurse Practitioner

## 2024-03-25 ENCOUNTER — Ambulatory Visit

## 2024-03-26 NOTE — Telephone Encounter (Signed)
 Requested medication (s) are due for refill today- yes  Requested medication (s) are on the active medication list -yes  Future visit scheduled -yes  Last refill: 08/09/23 #90 1RF  Notes to clinic: non delegated Rx  Requested Prescriptions  Pending Prescriptions Disp Refills   tiZANidine  (ZANAFLEX ) 4 MG tablet [Pharmacy Med Name: TIZANIDINE  HCL 4 MG TABLET] 90 tablet 1    Sig: TAKE 1 TABLET BY MOUTH EVERY 6 HOURS AS NEEDED FOR MUSCLE SPASMS.     Not Delegated - Cardiovascular:  Alpha-2 Agonists - tizanidine  Failed - 03/26/2024  9:45 AM      Failed - This refill cannot be delegated      Passed - Valid encounter within last 6 months    Recent Outpatient Visits           1 month ago Immunization due   [facility] Gareth Mliss FALCON, FNP   7 months ago Other fatigue   [facility] Gareth Mliss FALCON, [place]                 Requested Prescriptions  Pending Prescriptions Disp Refills   tiZANidine  (ZANAFLEX ) 4 MG tablet [Pharmacy Med Name: TIZANIDINE  HCL 4 MG TABLET] 90 tablet 1    Sig: TAKE 1 TABLET BY MOUTH EVERY 6 HOURS AS NEEDED FOR MUSCLE SPASMS.     Not Delegated - Cardiovascular:  Alpha-2 Agonists - tizanidine  Failed - 03/26/2024  9:45 AM      Failed - This refill cannot be delegated      Passed - Valid encounter within last 6 months    Recent Outpatient Visits           1 month ago Immunization due   [facility] Gareth Mliss FALCON, FNP   7 months ago Other fatigue   [facility] Gareth Mliss FALCON, [place]

## 2024-03-30 ENCOUNTER — Emergency Department

## 2024-04-05 ENCOUNTER — Other Ambulatory Visit: Payer: Self-pay | Admitting: Nurse Practitioner

## 2024-04-05 DIAGNOSIS — K219 Gastro-esophageal reflux disease without esophagitis: Secondary | ICD-10-CM

## 2024-04-05 NOTE — Telephone Encounter (Signed)
 Requested medications are due for refill today.  unsure  Requested medications are on the active medications list.  yes  Last refill. 03/13/2024  Future visit scheduled.   yes  Notes to clinic.  Rx signed by Dr. Franchot.     Requested Prescriptions  Pending Prescriptions Disp Refills   omeprazole  (PRILOSEC) 40 MG capsule [Pharmacy Med Name: OMEPRAZOLE  DR 40 MG CAPSULE] 90 capsule 3    Sig: TAKE 1 CAPSULE (40 MG TOTAL) BY MOUTH DAILY.     Gastroenterology: Proton Pump Inhibitors Passed - 04/05/2024  5:58 PM      Passed - Valid encounter within last 12 months    Recent Outpatient Visits           1 month ago Immunization due   [facility] Gareth Mliss FALCON, FNP   8 months ago Other fatigue   [facility] Gareth Mliss FALCON, [place]

## 2024-04-10 ENCOUNTER — Other Ambulatory Visit: Payer: Self-pay | Admitting: Nurse Practitioner

## 2024-04-10 DIAGNOSIS — F419 Anxiety disorder, unspecified: Secondary | ICD-10-CM

## 2024-04-12 NOTE — Telephone Encounter (Signed)
 Requested Prescriptions  Pending Prescriptions Disp Refills   hydrOXYzine  (VISTARIL ) 25 MG capsule [Pharmacy Med Name: HYDROXYZINE  PAM 25 MG CAP] 270 capsule 0    Sig: TAKE 1 CAPSULE (25 MG TOTAL) BY MOUTH EVERY 8 (EIGHT) HOURS AS NEEDED FOR ANXIETY.     Ear, Nose, and Throat:  Antihistamines 2 Passed - 04/12/2024  1:33 PM      Passed - Cr in normal range and within 360 days    Creat  Date Value Ref Range Status  08/09/2023 0.59 0.50 - 1.03 mg/dL Final   Creatinine, Ser  Date Value Ref Range Status  03/12/2024 0.74 0.44 - 1.00 mg/dL Final         Passed - Valid encounter within last 12 months    Recent Outpatient Visits           1 month ago Immunization due   [facility] Gareth Mliss FALCON, FNP   8 months ago Other fatigue   [facility] Gareth Mliss FALCON, [place]

## 2024-05-16 DIAGNOSIS — Z79899 Other long term (current) drug therapy: Secondary | ICD-10-CM | POA: Diagnosis not present

## 2024-05-16 DIAGNOSIS — M1711 Unilateral primary osteoarthritis, right knee: Secondary | ICD-10-CM | POA: Diagnosis not present

## 2024-05-16 DIAGNOSIS — M791 Myalgia, unspecified site: Secondary | ICD-10-CM | POA: Diagnosis not present

## 2024-05-27 DIAGNOSIS — I10 Essential (primary) hypertension: Secondary | ICD-10-CM

## 2024-06-05 MED ORDER — LISINOPRIL 5 MG PO TABS: 5.0000 mg | ORAL_TABLET | Freq: Every day | ORAL | 0 refills | Status: AC

## 2024-06-05 NOTE — Telephone Encounter (Signed)
 Copied from CRM #[id number]. Topic: Clinical - Prescription Issue >> Jun 05, 2024 12:59 PM Tiffini S wrote: Reason for CRM: [facility] states that medication refill for lisinopril  (ZESTRIL ) 5 MG tablet was denied- patient received a message from them on 04/08/24 that provider is no longer with the practice Patient still have one month supply of the prescription Patient asked for a call back [phone] for questions about why the message was sent

## 2024-06-07 ENCOUNTER — Ambulatory Visit: Attending: Cardiovascular Disease | Admitting: Cardiovascular Disease

## 2024-06-07 ENCOUNTER — Encounter: Payer: Self-pay | Admitting: Cardiovascular Disease

## 2024-06-07 VITALS — BP 124/80 | HR 67 | Ht 65.5 in | Wt 174.1 lb

## 2024-06-07 DIAGNOSIS — R072 Precordial pain: Secondary | ICD-10-CM

## 2024-06-07 DIAGNOSIS — I10 Essential (primary) hypertension: Secondary | ICD-10-CM | POA: Diagnosis not present

## 2024-06-07 MED ORDER — METOPROLOL TARTRATE 50 MG PO TABS: ORAL_TABLET | ORAL | 0 refills | Status: AC

## 2024-06-07 NOTE — Progress Notes (Signed)
 "    Cardiology Office Note   Date:  06/07/2024   ID:  [name], DOB Apr 07, 1971, MRN [id number]  PCP:  Gareth Mliss FALCON, FNP  Cardiologist:   Deatrice Cage, MD   Chief Complaint  Patient presents with   New Patient (Initial Visit)    Est. Care/HTN, Hx fam disease finger/right hand numbness. Meds reviewed verbally with pt.      History of Present Illness: [name] is a 54 y.o. female who was referred by Mliss Gareth for evaluation of chest pain.  She has known history of morbid obesity status post gastric bypass surgery 5 years ago with subsequent 125 pound weight loss, essential hypertension and family history of premature coronary artery disease.  She is not a smoker.  Her father had CABG and aortic valve replacement in his 10s and ultimately died of CVA in his early 88s.  Her brother had myocardial infarction. She was seen in the ED in October for right-sided and substernal chest pain described as tightness and heavy sensation that lasted for few hours.  This was associated with shortness of breath.  She tries to stay active and wants to exercise on a regular basis but she cut down on her activities since that time.  Her labs in the ED were unremarkable including normal troponin and D-dimer level. Previous CT imaging showed evidence of mild aortic atherosclerosis.   Past Medical History:  Diagnosis Date   Anxiety    Bowel obstruction (HCC)    Depression    Hypertension     Past Surgical History:  Procedure Laterality Date   ABDOMINAL HYSTERECTOMY     CARDIAC CATHETERIZATION     No stents   CHOLECYSTECTOMY     COLONOSCOPY WITH PROPOFOL  N/A 07/07/2022   Procedure: COLONOSCOPY WITH PROPOFOL ;  Surgeon: Therisa Bi, MD;  Location[facility] ENDOSCOPY;  Service: Gastroenterology;  Laterality: N/A;   ESOPHAGOGASTRODUODENOSCOPY N/A 07/07/2022   Procedure: ESOPHAGOGASTRODUODENOSCOPY (EGD);  Surgeon: Therisa Bi, MD;  Location: [facility] ENDOSCOPY;  Service: Gastroenterology;   Laterality: N/A;   GASTRIC BYPASS       Current Outpatient Medications  Medication Sig Dispense Refill   ergocalciferol  (VITAMIN D2) 1.25 MG (50000 UT) capsule Take 1 capsule (50,000 Units total) by mouth once a week. 12 capsule 3   hydrOXYzine  (VISTARIL ) 25 MG capsule TAKE 1 CAPSULE (25 MG TOTAL) BY MOUTH EVERY 8 (EIGHT) HOURS AS NEEDED FOR ANXIETY. 270 capsule 0   lisinopril  (ZESTRIL ) 5 MG tablet Take 1 tablet (5 mg total) by mouth daily. 90 tablet 0   omeprazole  (PRILOSEC) 40 MG capsule Take 1 capsule (40 mg total) by mouth 2 (two) times daily. For two weeks then once daily.     tiZANidine  (ZANAFLEX ) 4 MG tablet TAKE 1 TABLET BY MOUTH EVERY 6 HOURS AS NEEDED FOR MUSCLE SPASMS. 90 tablet 1   traMADol  (ULTRAM ) 50 MG tablet Take 1-3 tablets by mouth as needed for moderate pain (pain score 4-6).     No current facility-administered medications for this visit.    Allergies:   Patient has no known allergies.    Social History:  The patient  reports that she has never smoked. She has never used smokeless tobacco. She reports current alcohol use. She reports current drug use. Drug: Marijuana.   Family History:  The patient's family history includes Alcohol abuse in her father; Anxiety disorder in her brother; Breast cancer (age of onset: 69) in her mother; Cancer in her mother; Diabetes in her paternal grandmother; Heart  attack in her brother; Heart disease in her brother and father.    ROS:  Please see the history of present illness.   Otherwise, review of systems are positive for none.   All other systems are reviewed and negative.    PHYSICAL EXAM: VS:  BP 124/80 (BP Location: Right Arm, Patient Position: Sitting, Cuff Size: Large)   Pulse 67   Ht 5' 5.5 (1.664 m)   Wt 174 lb 2 oz (79 kg)   SpO2 99%   BMI 28.54 kg/m  , BMI Body mass index is 28.54 kg/m. GEN: Well nourished, well developed, in no acute distress  HEENT: normal  Neck: no JVD, carotid bruits, or masses Cardiac:  RRR; no murmurs, rubs, or gallops,no edema  Respiratory:  clear to auscultation bilaterally, normal work of breathing GI: soft, nontender, nondistended, + BS MS: no deformity or atrophy  Skin: warm and dry, no rash Neuro:  Strength and sensation are intact Psych: euthymic mood, full affect   EKG:  EKG is ordered today. The ekg ordered today demonstrates : Normal sinus rhythm Normal ECG When compared with ECG of 12-Mar-2024 08:46, No significant change was found      Recent Labs: 08/09/2023: TSH 1.20 03/12/2024: ALT 31; BUN 9; Creatinine, Ser 0.74; Hemoglobin 14.0; Magnesium 2.3; Platelets 257; Potassium 3.4; Sodium 143    Lipid Panel    Component Value Date/Time   CHOL 170 03/12/2024 0852   TRIG 60 03/12/2024 0852   HDL 74 03/12/2024 0852   CHOLHDL 2.3 03/12/2024 0852   VLDL 12 03/12/2024 0852   LDLCALC 84 03/12/2024 0852   LDLCALC 73 10/11/2021 0913      Wt Readings from Last 3 Encounters:  06/07/24 174 lb 2 oz (79 kg)  03/12/24 171 lb 15.3 oz (78 kg)  02/21/24 173 lb 9.6 oz (78.7 kg)          06/05/2024    1:31 PM  PAD Screen  Previous PAD dx? No   Previous surgical procedure? No   Pain with walking? Yes   Subsides with rest? Yes   Feet/toe relief with dangling? No   Painful, non-healing ulcers? No   Extremities discolored? No      Manually entered by patient      ASSESSMENT AND PLAN:  1.  Recent prolonged episode of chest tightness concerning for possible cardiac etiology with multiple risk factors for coronary artery disease including strong family history of premature CAD.  Recommend evaluation with cardiac CTA.  2.  Essential hypertension: Blood pressure is controlled on small dose lisinopril .  Her blood pressure improved significantly after she lost weight.    Disposition:   FU with me as needed  Signed,  Deatrice Cage, MD  06/07/2024 9:11 AM    [facility] "

## 2024-06-07 NOTE — Patient Instructions (Signed)
 Medication Instructions:  Your physician recommends that you continue on your current medications as directed. Please refer to the Current Medication list given to you today.    *If you need a refill on your cardiac medications before your next appointment, please call your pharmacy*  Lab Work: Your provider would like for you to have following labs drawn today BMP.     Testing/Procedures:   Your cardiac CT will be scheduled at one of the below locations:   [facility] [address] [place], [place] [address] [phone]  If scheduled at [facility] or [facility], please arrive 15 mins early for check-in and test prep.  There is spacious parking and easy access to the radiology department from the [facility] entrance. Please enter here and check-in with the desk attendant.   Please follow these instructions carefully (unless otherwise directed):  An IV will be required for this test and Nitroglycerin  will be given.    On the Night Before the Test: Be sure to Drink plenty of water. Do not consume any caffeinated/decaffeinated beverages or chocolate 12 hours prior to your test. Do not take any antihistamines 12 hours prior to your test.  On the Day of the Test: Drink plenty of water until 1 hour prior to the test. Do not eat any food 1 hour prior to test. You may take your regular medications prior to the test.  Take metoprolol  (Lopressor ) 50 mg two hours prior to test. Patients who wear a continuous glucose monitor MUST remove the device prior to scanning. FEMALES- please wear underwire-free bra if available, avoid dresses & tight clothing       After the Test: Drink plenty of water. After receiving IV contrast, you may experience a mild flushed feeling. This is normal. On occasion, you may experience a mild rash up to 24 hours after the test. This is not dangerous. If this occurs, you  can take Benadryl  25 mg, Zyrtec, Claritin, or Allegra and increase your fluid intake. (Patients taking Tikosyn should avoid Benadryl , and may take Zyrtec, Claritin, or Allegra) If you experience trouble breathing, this can be serious. If it is severe call 911 IMMEDIATELY. If it is mild, please call our office.  We will call to schedule your test 2-4 weeks out understanding that some insurance companies will need an authorization prior to the service being performed.   For more information and frequently asked questions, please visit our website : [email or web address]  For non-scheduling related questions, please contact the cardiac imaging nurse navigator should you have any questions/concerns: Cardiac Imaging Nurse Navigators Direct Office Dial: [phone]   For scheduling needs, including cancellations and rescheduling, please call Brittany, [phone].    Follow-Up: At [facility], you and your health needs are our priority.  As part of our continuing mission to provide you with exceptional heart care, our providers are all part of one team.  This team includes your primary Cardiologist (physician) and Advanced Practice Providers or APPs (Physician Assistants and Nurse Practitioners) who all work together to provide you with the care you need, when you need it.  Your next appointment:   As needed  Provider:   You may see Deatrice Cage, MD or one of the following Advanced Practice Providers on your designated Care Team:   Lonni Meager, NP Lesley Maffucci, PA-C Bernardino Bring, PA-C Cadence Walden, PA-C Tylene Lunch, NP Barnie Hila, NP

## 2024-06-08 LAB — BASIC METABOLIC PANEL WITH GFR
BUN/Creatinine Ratio: 21 (ref 9–23)
BUN: 16 mg/dL (ref 6–24)
CO2: 26 mmol/L (ref 20–29)
Calcium: 10.1 mg/dL (ref 8.7–10.2)
Chloride: 100 mmol/L (ref 96–106)
Creatinine, Ser: 0.75 mg/dL (ref 0.57–1.00)
Glucose: 87 mg/dL (ref 70–99)
Potassium: 5.5 mmol/L — ABNORMAL HIGH (ref 3.5–5.2)
Sodium: 141 mmol/L (ref 134–144)
eGFR: 95 mL/min/1.73

## 2024-06-10 ENCOUNTER — Ambulatory Visit: Payer: Self-pay | Admitting: Cardiovascular Disease

## 2024-06-10 DIAGNOSIS — E875 Hyperkalemia: Secondary | ICD-10-CM

## 2024-06-25 ENCOUNTER — Encounter (HOSPITAL_COMMUNITY): Payer: Self-pay

## 2024-06-27 ENCOUNTER — Ambulatory Visit
Admission: RE | Admit: 2024-06-27 | Discharge: 2024-06-27 | Disposition: A | Discharge status: Home / Self Care | Source: Ambulatory Visit | Attending: Cardiovascular Disease | Admitting: Cardiovascular Disease

## 2024-06-27 DIAGNOSIS — R072 Precordial pain: Secondary | ICD-10-CM | POA: Diagnosis present

## 2024-06-27 MED ORDER — IOHEXOL 350 MG/ML SOLN
100.0000 mL | Freq: Once | INTRAVENOUS | Status: AC | PRN
  Administered 2024-06-27: 100 mL via INTRAVENOUS

## 2024-06-27 MED ORDER — METOPROLOL TARTRATE 5 MG/5ML IV SOLN: 10.0000 mg | Freq: Once | INTRAVENOUS | Status: DC | PRN

## 2024-06-27 MED ORDER — NITROGLYCERIN 0.4 MG SL SUBL
0.8000 mg | SUBLINGUAL_TABLET | Freq: Once | SUBLINGUAL | Status: AC
  Administered 2024-06-27: 0.8 mg via SUBLINGUAL
  Filled 2024-06-27: qty 25

## 2024-06-27 MED ORDER — DILTIAZEM HCL 25 MG/5ML IV SOLN: 10.0000 mg | INTRAVENOUS | Status: DC | PRN

## 2024-06-27 MED ORDER — NITROGLYCERIN 0.4 MG SL SUBL
SUBLINGUAL_TABLET | SUBLINGUAL | Status: AC
  Filled 2024-06-27: qty 2

## 2024-06-30 ENCOUNTER — Other Ambulatory Visit: Payer: Self-pay | Admitting: Nurse Practitioner

## 2024-06-30 DIAGNOSIS — F419 Anxiety disorder, unspecified: Secondary | ICD-10-CM

## 2024-06-30 DIAGNOSIS — F5105 Insomnia due to other mental disorder: Secondary | ICD-10-CM

## 2024-07-02 NOTE — Telephone Encounter (Signed)
 Requested medication (s) are due for refill today: na  Requested medication (s) are on the active medication list: yes  Last refill:  03/26/24 #90 1 refills  Future visit scheduled: yes 07/30/24  Notes to clinic:  not delegated per protocol. Do you want to refill Rx?     Requested Prescriptions  Pending Prescriptions Disp Refills   tiZANidine  (ZANAFLEX ) 4 MG tablet [Pharmacy Med Name: TIZANIDINE  HCL 4 MG TABLET] 90 tablet 1    Sig: TAKE 1 TABLET BY MOUTH EVERY 6 HOURS AS NEEDED FOR MUSCLE SPASMS.     Not Delegated - Cardiovascular:  Alpha-2 Agonists - tizanidine  Failed - 07/02/2024  9:20 AM      Failed - This refill cannot be delegated      Passed - Valid encounter within last 6 months    Recent Outpatient Visits           4 months ago Immunization due   [facility] Gareth Mliss [place], FNP   10 months ago Other fatigue   [facility] Gareth Mliss [place], [place]

## 2024-07-04 ENCOUNTER — Encounter: Payer: Self-pay | Admitting: Internal Medicine

## 2024-07-30 ENCOUNTER — Ambulatory Visit: Admitting: Nurse Practitioner

## 2025-02-24 ENCOUNTER — Encounter: Admitting: Nurse Practitioner
